# Patient Record
Sex: Male | Born: 1960
Health system: Southern US, Community
[De-identification: ages and names within clinical notes are randomized; demographics above are authoritative.]

## PROBLEM LIST (undated history)

## (undated) DIAGNOSIS — K5790 Diverticulosis of intestine, part unspecified, without perforation or abscess without bleeding: Secondary | ICD-10-CM

## (undated) DIAGNOSIS — E669 Obesity, unspecified: Secondary | ICD-10-CM

## (undated) DIAGNOSIS — K219 Gastro-esophageal reflux disease without esophagitis: Secondary | ICD-10-CM

## (undated) DIAGNOSIS — I480 Paroxysmal atrial fibrillation: Secondary | ICD-10-CM

## (undated) DIAGNOSIS — M1712 Unilateral primary osteoarthritis, left knee: Secondary | ICD-10-CM

## (undated) DIAGNOSIS — F329 Major depressive disorder, single episode, unspecified: Secondary | ICD-10-CM

## (undated) DIAGNOSIS — F32A Depression, unspecified: Secondary | ICD-10-CM

## (undated) DIAGNOSIS — I4892 Unspecified atrial flutter: Secondary | ICD-10-CM

## (undated) DIAGNOSIS — M1990 Unspecified osteoarthritis, unspecified site: Secondary | ICD-10-CM

## (undated) HISTORY — DX: Unspecified atrial flutter: I48.92

## (undated) HISTORY — PX: COLON SURGERY: SHX602

## (undated) HISTORY — DX: Paroxysmal atrial fibrillation: I48.0

## (undated) HISTORY — PX: EYE SURGERY: SHX253

## (undated) HISTORY — DX: Unspecified osteoarthritis, unspecified site: M19.90

## (undated) HISTORY — PX: COLONOSCOPY: SHX174

## (undated) HISTORY — DX: Obesity, unspecified: E66.9

## (undated) HISTORY — DX: Depression, unspecified: F32.A

## (undated) HISTORY — PX: WISDOM TOOTH EXTRACTION: SHX21

## (undated) HISTORY — PX: MULTIPLE TOOTH EXTRACTIONS: SHX2053

## (undated) HISTORY — DX: Major depressive disorder, single episode, unspecified: F32.9

## (undated) HISTORY — PX: REPLANTATION THUMB: SUR1233

## (undated) HISTORY — PX: TONSILLECTOMY: SUR1361

## (undated) HISTORY — PX: COLOSTOMY REVERSAL: SHX5782

## (undated) HISTORY — DX: Gastro-esophageal reflux disease without esophagitis: K21.9

---

## 1998-07-28 ENCOUNTER — Ambulatory Visit (HOSPITAL_COMMUNITY): Admission: RE | Admit: 1998-07-28 | Discharge: 1998-07-28 | Payer: Self-pay | Admitting: Internal Medicine

## 1998-08-10 ENCOUNTER — Encounter: Payer: Self-pay | Admitting: Internal Medicine

## 1998-08-10 ENCOUNTER — Ambulatory Visit (HOSPITAL_COMMUNITY): Admission: RE | Admit: 1998-08-10 | Discharge: 1998-08-10 | Payer: Self-pay | Admitting: Internal Medicine

## 1999-03-12 ENCOUNTER — Emergency Department (HOSPITAL_COMMUNITY): Admission: EM | Admit: 1999-03-12 | Discharge: 1999-03-12 | Payer: Self-pay | Admitting: Emergency Medicine

## 1999-03-15 ENCOUNTER — Encounter: Payer: Self-pay | Admitting: Internal Medicine

## 1999-03-15 ENCOUNTER — Ambulatory Visit (HOSPITAL_COMMUNITY): Admission: RE | Admit: 1999-03-15 | Discharge: 1999-03-15 | Payer: Self-pay | Admitting: Internal Medicine

## 1999-11-25 ENCOUNTER — Emergency Department (HOSPITAL_COMMUNITY): Admission: EM | Admit: 1999-11-25 | Discharge: 1999-11-25 | Payer: Self-pay | Admitting: Emergency Medicine

## 1999-11-25 ENCOUNTER — Encounter: Payer: Self-pay | Admitting: Emergency Medicine

## 2001-03-13 ENCOUNTER — Emergency Department (HOSPITAL_COMMUNITY): Admission: EM | Admit: 2001-03-13 | Discharge: 2001-03-13 | Payer: Self-pay | Admitting: Emergency Medicine

## 2001-03-13 ENCOUNTER — Encounter: Payer: Self-pay | Admitting: Emergency Medicine

## 2002-03-26 ENCOUNTER — Ambulatory Visit (HOSPITAL_COMMUNITY): Admission: RE | Admit: 2002-03-26 | Discharge: 2002-03-26 | Payer: Self-pay | Admitting: Family Medicine

## 2002-03-26 ENCOUNTER — Encounter: Payer: Self-pay | Admitting: Family Medicine

## 2004-06-09 ENCOUNTER — Emergency Department (HOSPITAL_COMMUNITY): Admission: EM | Admit: 2004-06-09 | Discharge: 2004-06-09 | Payer: Self-pay | Admitting: Emergency Medicine

## 2004-06-10 ENCOUNTER — Emergency Department (HOSPITAL_COMMUNITY): Admission: EM | Admit: 2004-06-10 | Discharge: 2004-06-10 | Payer: Self-pay | Admitting: Emergency Medicine

## 2004-06-16 ENCOUNTER — Emergency Department (HOSPITAL_COMMUNITY): Admission: EM | Admit: 2004-06-16 | Discharge: 2004-06-17 | Payer: Self-pay | Admitting: Emergency Medicine

## 2005-12-26 HISTORY — PX: COLOSTOMY: SHX63

## 2006-01-09 ENCOUNTER — Emergency Department (HOSPITAL_COMMUNITY): Admission: EM | Admit: 2006-01-09 | Discharge: 2006-01-09 | Payer: Self-pay | Admitting: Emergency Medicine

## 2006-10-28 ENCOUNTER — Inpatient Hospital Stay (HOSPITAL_COMMUNITY): Admission: EM | Admit: 2006-10-28 | Discharge: 2006-11-01 | Payer: Self-pay | Admitting: Emergency Medicine

## 2006-11-03 ENCOUNTER — Encounter: Admission: RE | Admit: 2006-11-03 | Discharge: 2006-11-03 | Payer: Self-pay | Admitting: General Surgery

## 2006-11-07 ENCOUNTER — Ambulatory Visit (HOSPITAL_COMMUNITY): Admission: RE | Admit: 2006-11-07 | Discharge: 2006-11-07 | Payer: Self-pay | Admitting: General Surgery

## 2006-11-20 ENCOUNTER — Ambulatory Visit (HOSPITAL_COMMUNITY): Admission: RE | Admit: 2006-11-20 | Discharge: 2006-11-20 | Payer: Self-pay | Admitting: General Surgery

## 2006-12-05 ENCOUNTER — Ambulatory Visit (HOSPITAL_COMMUNITY): Admission: RE | Admit: 2006-12-05 | Discharge: 2006-12-05 | Payer: Self-pay | Admitting: General Surgery

## 2006-12-14 ENCOUNTER — Ambulatory Visit (HOSPITAL_COMMUNITY): Admission: RE | Admit: 2006-12-14 | Discharge: 2006-12-14 | Payer: Self-pay | Admitting: General Surgery

## 2006-12-18 ENCOUNTER — Inpatient Hospital Stay (HOSPITAL_COMMUNITY): Admission: EM | Admit: 2006-12-18 | Discharge: 2006-12-25 | Payer: Self-pay | Admitting: Emergency Medicine

## 2006-12-18 ENCOUNTER — Encounter (INDEPENDENT_AMBULATORY_CARE_PROVIDER_SITE_OTHER): Payer: Self-pay | Admitting: Specialist

## 2006-12-26 HISTORY — PX: INCISIONAL HERNIA REPAIR: SHX193

## 2007-03-16 ENCOUNTER — Encounter: Admission: RE | Admit: 2007-03-16 | Discharge: 2007-03-16 | Payer: Self-pay | Admitting: General Surgery

## 2007-05-04 ENCOUNTER — Inpatient Hospital Stay (HOSPITAL_COMMUNITY): Admission: RE | Admit: 2007-05-04 | Discharge: 2007-05-10 | Payer: Self-pay | Admitting: General Surgery

## 2007-05-04 ENCOUNTER — Encounter (INDEPENDENT_AMBULATORY_CARE_PROVIDER_SITE_OTHER): Payer: Self-pay | Admitting: Specialist

## 2007-08-21 ENCOUNTER — Emergency Department (HOSPITAL_COMMUNITY): Admission: EM | Admit: 2007-08-21 | Discharge: 2007-08-21 | Payer: Self-pay | Admitting: Emergency Medicine

## 2007-10-12 ENCOUNTER — Inpatient Hospital Stay (HOSPITAL_COMMUNITY): Admission: EM | Admit: 2007-10-12 | Discharge: 2007-10-16 | Payer: Self-pay | Admitting: Emergency Medicine

## 2007-10-18 ENCOUNTER — Inpatient Hospital Stay (HOSPITAL_COMMUNITY): Admission: EM | Admit: 2007-10-18 | Discharge: 2007-11-02 | Payer: Self-pay | Admitting: Emergency Medicine

## 2007-10-22 ENCOUNTER — Encounter (INDEPENDENT_AMBULATORY_CARE_PROVIDER_SITE_OTHER): Payer: Self-pay | Admitting: General Surgery

## 2008-11-25 ENCOUNTER — Ambulatory Visit (HOSPITAL_BASED_OUTPATIENT_CLINIC_OR_DEPARTMENT_OTHER): Admission: RE | Admit: 2008-11-25 | Discharge: 2008-11-25 | Payer: Self-pay | Admitting: Orthopedic Surgery

## 2009-04-25 HISTORY — PX: KNEE ARTHROSCOPY: SHX127

## 2010-04-20 ENCOUNTER — Ambulatory Visit (HOSPITAL_COMMUNITY): Admission: RE | Admit: 2010-04-20 | Discharge: 2010-04-21 | Payer: Self-pay | Admitting: Neurosurgery

## 2010-04-25 HISTORY — PX: OTHER SURGICAL HISTORY: SHX169

## 2010-12-17 ENCOUNTER — Encounter
Admission: RE | Admit: 2010-12-17 | Discharge: 2010-12-17 | Payer: Self-pay | Source: Home / Self Care | Attending: Orthopedic Surgery | Admitting: Orthopedic Surgery

## 2011-03-15 LAB — BASIC METABOLIC PANEL
BUN: 15 mg/dL (ref 6–23)
CO2: 27 mEq/L (ref 19–32)
Calcium: 9.6 mg/dL (ref 8.4–10.5)
Chloride: 107 mEq/L (ref 96–112)
Creatinine, Ser: 0.85 mg/dL (ref 0.4–1.5)
GFR calc Af Amer: >60 mL/min (ref >60)
GFR calc non Af Amer: >60 mL/min (ref >60)
Glucose, Bld: 100 mg/dL — ABNORMAL HIGH (ref 70–99)
Potassium: 4.4 mEq/L (ref 3.5–5.1)
Sodium: 138 mEq/L (ref 135–145)

## 2011-03-15 LAB — CBC
HCT: 42.6 % (ref 39.0–52.0)
Hemoglobin: 14.8 g/dL (ref 13.0–17.0)
MCHC: 34.6 g/dL (ref 30.0–36.0)
MCV: 91.2 fL (ref 78.0–100.0)
Platelets: 208 10*3/uL (ref 150–400)
RBC: 4.67 MIL/uL (ref 4.22–5.81)
RDW: 13.5 % (ref 11.5–15.5)
WBC: 11.6 10*3/uL — ABNORMAL HIGH (ref 4.0–10.5)

## 2011-03-15 LAB — SURGICAL PCR SCREEN
MRSA, PCR: NEGATIVE
Staphylococcus aureus: POSITIVE — AB

## 2011-05-10 NOTE — H&P (Signed)
[name], [name]              ACCOUNT NO.:  [id number]   MEDICAL RECORD NO.:  [id number]          PATIENT TYPE:  INP   LOCATION:  1531                         FACILITY:  [facility]   PHYSICIAN:  Alfonse Ras, MD   DATE OF BIRTH:  10-11-1961   DATE OF ADMISSION:  10/18/2007  DATE OF DISCHARGE:                              HISTORY & PHYSICAL   INTERIM HISTORY AND PHYSICAL   ADMISSION DIAGNOSIS:  Recurrent high-grade small bowel obstruction.   HISTORY OF PRESENT ILLNESS:  The patient is a 50 year old white male  with a complex past surgical history who returns after being discharged  about 36 hours ago with onset of abdominal pain, nausea, and diarrhea.  He has not been vomiting but does feel like he needs to.  He was worked  up here in the emergency room and found to have a high-grade small bowel  obstruction on KUB.  He complains of diffuse abdominal pain, but no  evidence of incarceration of either of his hernias.   PAST MEDICAL HISTORY:  Significant for PVCs; perforated sigmoid  diverticulitis in the past, known well to Dr. Abbey Chatters with colectomy,  colostomy closure, and with ventral hernias.  The patient is scheduled 8  days from today for ventral hernia repair and lysis of adhesions.   MEDICATIONS:  Included atenolol and Ambien.   SOCIAL HISTORY:  He is accompanied by his wife.  He is a former smoker  and uses no alcohol.   PHYSICAL EXAMINATION:  GENERAL:  He is an age-appropriate white male in  no distress.  VITAL SIGNS:  His temperature is 99.4, his heart rate is 86, respiratory  rate is 16, and blood pressure is 164/98.  HEENT:  Benign.  Normocephalic and atraumatic.  NECK:  Supple and soft without thyromegaly or cervical adenopathy.  LUNGS:  Clear to auscultation and percussion x2.  HEART:  Regular rate and rhythm without murmurs, rubs, or gallops.  ABDOMEN:  Soft.  There is a reducible midline periumbilical hernia and a  right-sided upper abdominal reducible  hernia.   LABORATORY DATA:  Labs today show a white count of 10,000.  Hemoglobin  is 15.8.  Electrolytes are all within normal limits.  Creatinine is 0.9.   IMPRESSION:  Small bowel obstruction.   PLAN:  NG tube, IV fluid hydration, and followup with Dr. Abbey Chatters  tomorrow and possible expedience of his operative intervention.      Alfonse Ras, MD  Electronically Signed     KRE/MEDQ  D:  10/18/2007  T:  10/19/2007  Job:  [id number]   cc:   Adolph Pollack, M.D.  [address], [address]  [place]  [place] [address]

## 2011-05-10 NOTE — Op Note (Signed)
[name], [name]              ACCOUNT NO.:  [id number]   MEDICAL RECORD NO.:  [id number]          PATIENT TYPE:  AMB   LOCATION:  DSC                          FACILITY:  [facility]   PHYSICIAN:  Katy Fitch. Sypher, M.D. DATE OF BIRTH:  08/04/1961   DATE OF PROCEDURE:  11/25/2008  DATE OF DISCHARGE:                               OPERATIVE REPORT   PREOPERATIVE DIAGNOSIS:  A 21 days status post sheet metal laceration  dorsal aspect of right thumb at interphalangeal joint with clinical  extensor lag of interphalangeal joint suggesting extensor pollicis  longus laceration with a 2-week history of wound infection, treated with  oral doxycycline with subsequent wound improvement.   POSTOPERATIVE DIAGNOSIS:  Status post laceration of extensor pollicis  longus at interphalangeal joint, right thumb with extensor lag and  chronic granulation tissue within interphalangeal joint and at extensor  tendon laceration site.   OPERATION:  1. Excisional debridement of chronically infected right thumb      interphalangeal joint and dorsal extensor tendon laceration wound.  2. Interphalangeal joint irrigation.  3. Internal fixation of right thumb interphalangeal joint at 15      degrees hyperextension utilizing a 0.045-inch Kirschner wire placed      with retrograde technique.  4. Reconstruction of right thumb extensor pollicis longus, 21 days      post-laceration with grasping suture of 3-0 monofilament Prolene      and finishing suture of monofilament Prolene.   OPERATIONS:  Katy Fitch. Sypher, MD   ASSISTANT:  Marveen Reeks Dasnoit, PA-C   ANESTHESIA:  General by LMA.   SUPERVISING ANESTHESIOLOGIST:  Guadalupe Maple, MD   INDICATIONS:  [name] is a 50 year old gentleman referred by  Knox Royalty, MD for evaluation and management of a right thumb dorsal  laceration with extensor lag at the IP joint and cellulitis of the  thumb.  On November 04, 2008, [name] sustained an on-the-job injury  and was treated at an Outpatient Occup[facility] Facility.  His  wound was cleaned and repaired.  He went on to develop what appeared to  be cellulitis and was started on an oral antibiotic.  He developed an  extensor lag of the thumb and was referred for upstanding orthopedic  consult.  He was seen on November 19, 2008 where his chronically  infected wound appeared to be improving and his extensor lag was noted.   We formulated a plan to continue oral antibiotic therapy for an  additional 5 days with local wound care, followed by anticipated delayed  reconstruction of his extensor tendon and internal fixation of the IP  joint and hyperextension to protect the tendon repair.   Preoperatively, [name] was very precisely informed of the risks of  this predicament, i.e., he has a chronically contaminated wound with a  history of past cellulitis, a likely injury into his IP joint, and a  recognized extensor tendon injury.   He will require internal fixation to protect his tendon repair and could  be at risk for late sepsis given a contaminated wound.   He discussed whether or not he  could return immediately to work after  surgery.   In the office and in the holding area, I recommended that he go back  only to strict one-handed duty and not try to work with the injured  right hand.   Should he soil his pin or his wound, he could develop recurrent wound  sepsis, possibly disrupt the extensor tendon repair, possibly develop  septic arthritis or osteomyelitis of his thumb bones and could end up  with permanent disability.  Therefore, I urged him to follow our  strategy of oral antibiotic therapy and local wound care to stabilize  the wound followed by wound excision and delayed reconstruction of the  tendon with monofilament suture and wound protection by internal  fixation of the IP joint.  Questions were invited and answered in detail  in the holding area with [name] and his  wife prior to surgery.  We  did obtain a written precertification from his insurance carrier prior  to surgery.   PROCEDURE:  [name] is brought to the operating room and placed  in supine position upon on the operating table.   Following an anesthesia consult, general anesthesia by LMA technique was  recommended and accepted.   He was brought to room 6, placed in supine position upon on the  operating table and under Dr. Morley Kos direct supervision, general  anesthesia by LMA technique induced.  The right arm was prepped with  Betadine soap solution and sterilely draped.  Vancomycin 1 g was  administered as an IV prophylactic antibiotic due to antibiotic  allergies and a concern about possible wound contamination  preoperatively.   The right arm was exsanguinated with an Esmarch bandage and an arterial  tourniquet on the proximal brachium inflated to 225 mmHg.   Procedure commenced with a formal excisional debridement of the wound  with removal of the healing skin margins, subcutaneous tissues and  freshening of the extensor tendons, proximal and distal stumps.  The  wound did continue directly into the IP joint with capsular entry.  There was some granulation tissue within the IP joint as well as  metallic foreign debris.  The IP joint was meticulously irrigated  followed by debridement with sterile forceps and further irrigation.  The IP joint was then secured with a 0.045-inch Kirschner wire, drilled  retrograde into the proximal phalanx and distally with C-arm control.  The extensor tendon was then mobilized by tenolysis proximally and  repaired with two core grasping sutures of 3-0 Prolene with the knots  buried deep and a finishing suture of 3-0 Prolene.  Monofilament was  selected to minimize the risk of postrepair suture sepsis.   The wound was then repaired with intradermal 4-0 Prolene.  Lidocaine 2%  was infiltrated for postop analgesia.  The wound was dressed  with  Xeroflo, sterile gauze, sterile Webril, and a forearm-based thumb spica  splint.  There were no apparent complications.   For aftercare, [name] was provided Dilaudid 2 mg one to two tablets  p.o. q.4-6 h. p.r.n. pain, 30 tablets without refill and doxycycline 100  mg one p.o. b.i.d. x10 days as a therapeutic antibiotic.      Katy Fitch Sypher, M.D.  Electronically Signed     RVS/MEDQ  D:  11/25/2008  T:  11/26/2008  Job:  [id number]   cc:   Paulino Rily, M.D.

## 2011-05-10 NOTE — Op Note (Signed)
[name], [name] NO.:  [id number]   MEDICAL RECORD NO.:  [id number]          PATIENT TYPE:  INP   LOCATION:  1531                         FACILITY:  [facility]   PHYSICIAN:  Adolph Pollack, M.D.DATE OF BIRTH:  Jun 14, 1961   DATE OF PROCEDURE:  10/22/2007  DATE OF DISCHARGE:                               OPERATIVE REPORT   PREOPERATIVE DIAGNOSIS:  Small-bowel obstruction and ventral incisional  hernias.   PREOPERATIVE DIAGNOSIS:  Small-bowel obstruction and ventral incisional  hernias.   PROCEDURE:  Exploratory laparotomy, lysis of adhesions, repair of  ventral hernias with Proceed mesh.   SURGEON:  Adolph Pollack, M.D.   ASSISTANT:  Consuello Bossier, M.D.   ANESTHESIA:  General.   INDICATIONS:  [name] is a 50 year old male who has elective  laparoscopic ventral hernia repairs scheduled for Friday October 31.  However, in the past two weeks has had two episodes of small bowel  obstruction that quickly resolved with decompression and bowel rest.  He  recently was admitted for this problem on October 18, 2007.  The hernias  are reducible so it is not due to that.  Thus I had moved the surgery up  to today.  We discussed exploratory laparotomy with looking for a source  of small bowel obstruction and fixing his ventral hernias.   TECHNIQUE:  He is brought to the operating room, placed supine on the  operating table and general anesthetic was administered.  The hair in  the abdominal wall was clipped.  The abdominal wall was sterilely  prepped and draped.  I began above the previous midline incision,  incising the skin and some of the incision down to the periumbilical  region.  The subcutaneous tissue was divided with cautery.  I quickly  identified hernia contents coming up into the wound in the epigastric  region.  Normal fascia was incised and peritoneal cavity was entered.  I  then dissected the omentum free from the abdominal wall and opened  up  the hernia sacs and then fascia the rest the way down to the below the  umbilicus.  There was a hernia in the periumbilical region and one just  above that as well.  Where he had a previous colostomy felt like there  was a defect.  I then used the electrocautery to dissect omentum free  from the abdominal wall and free it up completely.  There was indeed  hernia where the previous colostomy was.  I noted some dilated bowel and  traced this down to an area in the right lower quadrant where there was  adhesion leading to the bowel obstruction.  I lysed this adhesions  freeing the obstructed area.  I then ran the entire small bowel lysing  other adhesions as well but this is the only part of obstruction.  No  small bowel injuries were made.   Following this I directed my attention to repairing hernia.  On the  right side, I raised subcutaneous flaps off the fascia, then did this  superiorly and inferiorly in a limited fashion.   On the left side  I raised flaps both above and below the area where the  colostomy and did not try to raise subcutaneous flaps around the  colostomy site.  Following this I then brought in a large piece of  Proceed surgical mesh measuring about 28 x 34 cm.  I initially placed it  with the nonadherent barrier down on the left side well lateral to the  stoma the previous colostomy site incisional hernia defect and I tacked  this using the spiral tacker to cover this defect.  I then used a  transfascial fixation sutures of #1 Novofil to further anchor the mesh  to the fascia in all directions.  I performed more tacking inferiorly as  well.  Once I had completed the mesh fixation in 360 degrees the mesh  had a little bit of laxity but not much.  Some overlapping fascia was  then anchored to the mesh with interrupted 2-0 Vicryl sutures.  Of note  should be that I was able to place the omentum between the small bowel  and the mesh.   Following this hemostasis was  adequate.  A stab incision was made in the  right lower quadrant and a 19 Blake drain was placed so that it looped  around to include both the right and left sides.  This was anchored to  the skin with 3-0 nylon suture.  At this point sponge, needle and  instrument counts were correct.  I then reapproximated the subcutaneous  tissue over the mesh with running 2-0 Vicryl suture.  Subcutaneous  tissues irrigated and skin was closed with staples.  Sterile dressings  applied.   He tolerated the procedure well without any apparent complications was  taken to recovery in satisfactory condition.      Adolph Pollack, M.D.  Electronically Signed     TJR/MEDQ  D:  10/22/2007  T:  10/23/2007  Job:  [id number]

## 2011-05-10 NOTE — H&P (Signed)
[name], [name]              ACCOUNT NO.:  [id number]   MEDICAL RECORD NO.:  [id number]          PATIENT TYPE:  INP   LOCATION:  1535                         [facility]ACILITY:  [facility]   PHYSICIAN:  Adolph Pollack, M.D.DATE OF BIRTH:  1961-03-16   DATE OF ADMISSION:  10/12/2007  DATE OF DISCHARGE:                              HISTORY & PHYSICAL   CHIEF COMPLAINT:  Abdominal pain, distention, nausea, vomiting.   PRESENT ILLNESS:  [name] is a 50 year old male with a complex  surgical history.  Last night, after eating pork, he got crampy pain in  the right abdomen and progressively has had distention, and this was  eventually followed by dry heaves and small-volume nausea and vomiting.  Last BM was yesterday at noon.  He has passed some gas today.  He called  the office, and I had him present to the emergency department for  evaluation.  He has had some sweats that occur with the cramping pain,  but no fever or chills.   PAST MEDICAL HISTORY:  1. Premature ventricular contractions.  2. Perforated sigmoid diverticulitis.  3. Ventral hernia.   PREVIOUS OPERATIONS:  1. Left knee arthroscopy.  2. Sigmoid colectomy and colostomy.  3. Colostomy closure.   ALLERGIES:  1. PENICILLINS.  2. PERCOCET.  3. ERYTHROMYCIN.   MEDICATIONS:  Atenolol and Ambien.   SOCIAL HISTORY:  Her is married.  Here with his wife.  Former smoker.  No alcohol use.   REVIEW OF SYSTEMS:  GENERAL:  He was in a normal state of health until  yesterday.  CARDIOVASCULAR:  No myocardial infarction or hypertension.  PULMONARY:  No asthma or COPD.  GI:  No known peptic ulcer disease.  GU:  No dysuria or hematuria.  ENDOCRINE:  No diabetes or  hypercholesterolemia.   PHYSICAL EXAM:  GENERAL:  An ill-appearing male, but he is in no acute  distress.  He is pleasant and cooperative.  VITAL SIGNS:  Temperature is 97.7, blood pressure is 128/84, pulse of  93.  HEENT: Extraocular motions intact, no icterus.   Mucous membranes are  dry.  NECK:  Supple without masses or thyroid enlargement.  CHEST:  Breath sounds equal and clear.  Respirations unlabored.  CARDIOVASCULAR:  Demonstrates a regular, regular rhythm.  I hear no  murmur.  ABDOMEN:  Soft.  There is a reducible midline ventral incisional hernia  present.  There is mild right-sided tenderness.  There is a left-sided  incision that is solid.  He has hypoactive bowel sounds.  EXTREMITIES:  No cyanosis or edema.  SKIN:  No jaundice.   LABORATORY DATA:  Notable for white cell count of 16,200, hemoglobin  16.8.  Electrolytes within normal limits, except for glucose of 123.  Albumin 4.1.   X-rays demonstrate some dilated small-bowel loops with air-fluid levels  and some air in the transverse and right colon.   IMPRESSION:  Likely partial small-bowel obstruction versus enteritis.   PLAN:  Will admit, IV fluid hydration, NG tube decompression, follow-up  x-rays and labs in morning.  Will try to treat this nonoperatively.  He  does have elective  surgery to repair his ventral hernia in about 2-3  weeks.  If it does not respond to nonoperative management, then we will  need to go ahead and do exploratory laparotomy.      Adolph Pollack, M.D.  Electronically Signed     TJR/MEDQ  D:  10/12/2007  T:  10/15/2007  Job:  [id number]   cc:   Dario Guardian, M.D.  Fax: [phone]

## 2011-05-13 NOTE — Discharge Summary (Signed)
[name], [name]              ACCOUNT NO.:  [id number]   MEDICAL RECORD NO.:  [id number]          PATIENT TYPE:  INP   LOCATION:  1531                         FACILITY:  [facility]   PHYSICIAN:  Adolph Pollack, M.D.DATE OF BIRTH:  05/04/1961   DATE OF ADMISSION:  10/18/2007  DATE OF DISCHARGE:  11/02/2007                               DISCHARGE SUMMARY   FINAL DISCHARGE DIAGNOSIS:  Small-bowel obstruction.   SECONDARY DIAGNOSES:  1. Ventral hernia repairs.  2. Postoperative ileus.  3. Anxiety.  4. Protein calorie malnutrition.  5. Pneumonia.   PROCEDURE:  Exploratory laparotomy with lysis of adhesions and repair of  ventral incisional hernias with mesh.   REASON FOR ADMISSION:  [name] is a 50 year old male with ventral  hernias and actually he had been admitted and discharged two days prior  to this admission with a partial small-bowel obstruction and came back  with the same symptoms of small bowel obstruction and was admitted.   HOSPITAL COURSE:  He was again started on IV fluids and NG tube, and his  bowel obstruction rapidly resolved.  A PICC line was started.  He was  placed on TNA.  He subsequently was taken to the operating room on  October 22, 2007 where he underwent the above procedure.  Postoperatively he had an NG tube in and had his TNA continued and the  postoperative ileus.  He was ambulatory fairly soon.  He had a drain in  which drained serous material.  By the sixth postoperative day he  started passing some gas, and his NG tube was clamped.  He began having  a BM but also had some vomiting.  He became fairly anxious was given  some Ativan and this controlled it well.  He is having multiple loose  stools and a stool for C. diff was sent and was negative.  He had a  leukocytosis of 15,800 and an x-ray demonstrating some left basilar  atelectasis.  He had also been coughing up some green sputum.  So the  thought was he may have an intra-abdominal  infection versus pneumonia.   A CT scan demonstrated no intraperitoneal fluid, small bowel ileus type  pattern, right and left abdominal wall fluid collections with slight rim  enhancement of the left side collection.  I had started him on IV Invanz  as well for possible pneumonia.  His NG was removed, and a full liquid  diet was started.  Interventional radiology was consulted and left  abdominal fluid collection was aspirated.  He had about 21 mL of brown-  colored fluid.  His leukocytosis improved.  His aspiration fluid culture  was no growth, and he was tolerating his diet well. Wound looked good.  Jackson-Pratt drain was removed, and he was able to be discharged on  November 02, 2007.   DISPOSITION:  Discharged to home November 02, 2007 in satisfactory  condition.   DISCHARGE INSTRUCTIONS:  He is given activity restrictions.  He is to  take Darvocet for pain and Avelox for presumptive postoperative  pneumonia.  He will follow up to see me in  about 1-2 weeks.      Adolph Pollack, M.D.  Electronically Signed     TJR/MEDQ  D:  11/09/2007  T:  11/09/2007  Job:  [id number]

## 2011-05-13 NOTE — H&P (Signed)
NAMEBRAYAM, BOEKE NO.:  [id number]   MEDICAL RECORD NO.:  [id number]         PATIENT TYPE:  LINP   LOCATION:                               FACILITY:  [facility]   PHYSICIAN:  Wilmon Arms. Corliss Skains, M.D. DATE OF BIRTH:  09-17-1961   DATE OF ADMISSION:  12/18/2006  DATE OF DISCHARGE:                              HISTORY & PHYSICAL   CHIEF COMPLAINT:  Left lower quadrant abdominal wall pain, swelling, and  redness.   HISTORY OF PRESENT ILLNESS:  The patient is a 50 year old male who was  initially admitted by Dr. Abbey Chatters 10/28/2006 with the diagnosis  sigmoid diverticulitis with micro perforation.  He was initially treated  with intravenous antibiotics.  A CT scan was repeated and showed that  the fluid collection, in his pelvis, had a large.  He then had a  percutaneous drain placed by radiology which improved his clinical  status.  He kept the drain at home as well as with oral antibiotics for  several weeks.  He had a fistulogram on 12/05/2006 which showed  decreased contrast filling of the sigmoid colon.  He did have spillage  of contrast into the peritoneum which resulted in several days of  contrast peritonitis.  His drain came out at home on 12/10/2006.   On 12/20 the patient began complaining of swelling and redness around  his previous pelvic drain site.  He was evaluated in the urgent office  at [facility] by Dr. Earlene Plater who sent him to [facility] for a CT scan of the abdomen and pelvis.  This scan was  performed later that day; and showed a 2.3 cm gas collection in the  region of the previous abscess.  There is also inflammatory change in  the left abdominal musculature around the previous drainage tract.  He  was evaluated by Dr. Ovidio Kin who was on call, at [facility] that evening; and was felt to be ready for discharge.  He was  sent home with ciprofloxacin and Flagyl.  He was also given p.r.n. pain  medication.   He has been feeling worse over the last couple of days so  he came back to the emergency department.  He is noted to be febrile;  and had an increased white count.   PAST MEDICAL HISTORY:  Premature ventricular contractions.   PAST SURGICAL HISTORY:  1. Left knee arthroscopy.  2. Tonsillectomy.   ALLERGIES:  ERYTHROMYCIN, PENICILLIN.   MEDICATIONS:  Atenolol, aspirin, Cipro, and Flagyl.   SOCIAL HISTORY:  The patient recently quit smoking.  Denies alcohol use.   FAMILY HISTORY:  Positive for diabetes, PVCs in a sister, diverticulitis  in his mother.   REVIEW OF SYSTEMS:  CV:  Denies heart disease or hypertension.  PULMONARY:  Denies dyspnea, pneumonia, emphysema, or asthma.  GI: Denies  peptic ulcer disease, hiatal hernia reflux, melena, hepatitis, or  jaundice.  ENDOCRINE:  No diabetes.  HEMATOLOGIC: No bleeding disorders  or blood clots.  NEUROLOGIC: No strokes or procedures.   PHYSICAL EXAMINATION:  GENERAL:  This is a well-developed, well-  nourished male in no apparent distress.  VITAL SIGNS: Temperature 101.3, heart rate 112, respirations 20, blood  pressure 125/75.  HEENT:  EOMI.  Sclerae anicteric.  NECK: No masses, no thyromegaly.  LUNGS:  Clear to auscultation bilaterally.  Normal respiratory effort.  HEART:  Regular rate and rhythm.  No murmur.  ABDOMEN:  Positive bowel sounds, soft, left lower quadrant out to the  left flank is erythematous.  I can see the previous drainage tract.  This is the area of the most tenderness.  There is no obvious fluctuance  underneath this; but the patient is fairly tender this area.  This  extends partly around to his back.   LABS:  White count 20.7, hemoglobin 13.5, sodium 132, potassium 3.7.   IMPRESSION:  1. Probable recurrent diverticular abscess, now with possible      involvement of the abdominal wall.  2. Hyponatremia.  3. Hypokalemia.   PLAN:  1. We will admit the patient for IV hydration.  2. IV antibiotics.  We  will use Tygacil since the patient is      PENICILLIN allergic; and has had a serious intra-abdominal      infection.  3. CT scan of the abdomen and pelvis with contrast; before he leaves      the emergency department this morning.  4. Replete electrolytes.  5. We will discuss this case further with Dr. Abbey Chatters.  The patient      may need further percutaneous drainage versus a possible sigmoid      colectomy with a possible temporary colostomy.      Wilmon Arms. Tsuei, M.D.  Electronically Signed     MKT/MEDQ  D:  12/18/2006  T:  12/18/2006  Job:  [id number]

## 2011-05-13 NOTE — Op Note (Signed)
[name], GROENEVELD NO.:  [id number]   MEDICAL RECORD NO.:  [id number]          PATIENT TYPE:  INP   LOCATION:  1612                         FACILITY:  [facility]   PHYSICIAN:  Adolph Pollack, M.D.DATE OF BIRTH:  [birth year]   DATE OF PROCEDURE:  05/04/2007  DATE OF DISCHARGE:                               OPERATIVE REPORT   PREOPERATIVE DIAGNOSIS:  Colostomy and wound sinus.   POSTOPERATIVE DIAGNOSIS:  Colostomy and wound sinus.   PROCEDURE:  1. Laparoscopic assisted colostomy closure.  2. Inferior wound debridement.   SURGEON:  Adolph Pollack, M.D.   ASSISTANT:  Alfonse Ras, M.D.   ANESTHESIA:  General.   INDICATIONS:  50 year old male underwent a sigmoid colectomy and  colostomy for complicated diverticulitis.  This was done in  December2007.  He has had intermittently draining sinuses from the  inferior aspect of the wound, as well.  He now presents for the above  procedure.   TECHNIQUE:  He is brought to the operating room, placed on operating  table, and general anesthetic was administered.  A Foley catheter was  placed in the bladder and he was placed in the lithotomy position.  The  stomal appliance was removed.  The abdominal wall was sterilely prepped  and draped as well as the perineal area.  I covered the stoma with  Betadine soaked gauze and a dry towel.  I made an incision in the right  mid abdomen through skin, subcutaneous tissue and fascial layers and  entering the peritoneal cavity.  A purse-string suture of 0 Vicryl was  placed around the fascial edges.  A Hassan trocar was introduced into  the peritoneal cavity and pneumoperitoneum created by insufflation of  CO2 gas.   Next, the laparoscope introduced.  There were multiple adhesions between  the omentum and the anterior abdominal wall.  I placed a 10 mm trocar in  the right lower quadrant and one in the right upper quadrant.  Using  careful sharp and blunt dissection as  well as the harmonic scalpel, I  divided the adhesions from the omentum to the anterior abdominal wall  the length of his incision as well as in the left upper quadrant and  left mid abdomen.  I was able to expose the colostomy area and noted  some small bowel adhesions which I carefully took down sharply.  I then  was able to visualize the pelvis area and saw a blue Prolene suture.  This identified the rectosigmoid stump which I had left behind.  There  appeared to be some loops of small bowel densely adherent to the staple  line.   I subsequently approached the colostomy and made an elliptical incision  through the skin and full thickness.  Using the electrocautery, I  carefully dissected from the stoma free from its fascial attachments and  I was able to bring out a fair amount of the descending colon and  mobilize it.  I then used the GIA stapler to amputate off the stoma and  send the segmental colectomy specimen to pathology.   Following this, I reopened  the lower aspect of his midline wound where  he had the chronic draining sinus tracts excising full thickness skin  and subcutaneous tissue in the sinus tract.  I then divided the fascia  with electrocautery, entering the abdominal cavity.  I identified the  rectosigmoid stump by the Prolene suture.  I performed sharp  adhesiolysis freeing up the small bowel from the rectosigmoid stump  giving me some length with that.  I was easily able to bring the  descending colon down to the rectosigmoid stump without tension.  I then  removed both staple lines with electrocautery.  I performed an end-to-  end anastomosis with an interrupted single layer of 3-0 silk sutures.  Following this, I placed the anastomosis under water and insufflated air  through the rectum and no leak was noted.   I then copiously irrigated out the abdominal cavity and evacuated the  fluid.  No active bleeding was noted.  The anastomosis was patent and  viable  under no tension.  Tisseel was placed around the anastomosis.  Following this, sponge, needle, and instrument counts were reported to  be correct.  I then closed the lower midline wound fascia with a running  #1 Novofil suture and placed one #2 Ethibond retention suture right in  the middle of this wound.  I did not close the skin.  I then closed the  colostomy site fascia with a running #1 Novofil suture.  Both of these  wounds were then packed with moist gauze.  I reinsufflated the abdomen  and inspected both fascial closures and they were solid.  I removed the  abdominal trocars.  I then tightened up the [facility] trocar incision site  fascia by tying down the pursestring suture.  Staples were used to close  the skin of the trocar sites.   A moist gauze was packed in the lower abdominal wound and the colostomy  wound and a bulky dressing was applied.  He tolerated the procedure well  without any apparent complications and was taken to the recovery room in  satisfactory condition.      Adolph Pollack, M.D.  Electronically Signed     TJR/MEDQ  D:  05/04/2007  T:  05/04/2007  Job:  [id number]

## 2011-05-13 NOTE — Discharge Summary (Signed)
NAMEJOVANNY, [name]              ACCOUNT NO.:  [id number]   MEDICAL RECORD NO.:  [id number]          PATIENT TYPE:  INP   LOCATION:  1608                         FACILITY:  [facility]   PHYSICIAN:  Adolph Pollack, M.D.DATE OF BIRTH:  03/16/1961   DATE OF ADMISSION:  10/28/2006  DATE OF DISCHARGE:  11/01/2006                               DISCHARGE SUMMARY   PRINCIPAL DISCHARGE DIAGNOSIS:  Sigmoid diverticulitis with abdominal  abscess.   SECONDARY DIAGNOSIS:  History of premature ventricular contractions.   PROCEDURE:  Percutaneous drainage of abscess by interventional radiology  October 31, 2006.   REASON FOR ADMISSION:  This is a 50 year old male who 48 hours prior to  admission had significant cramping lower abdominal pain and diarrhea,  specifically after a meal.  The pain persisted and he started having  fever and he came to the emergency department.  He was evaluated there  and a CT scan demonstrated inflammatory changes of the sigmoid colon and  some extraluminal air and fluid suggesting a micro-perforation most  consistent with a diverticulitis versus possible foreign body.  He was  subsequently admitted.   HOSPITAL COURSE:  He was admitted and placed on intravenous antibiotics.  His fever and leukocytosis did respond to this.  A CT scan was repeated,  however, the fluid collection was larger.  Subsequently, interventional  radiology placed a drainage catheter and he felt quite a bit better  after that.  The day after that, he was feeling good.  He was switched  to oral antibiotics.  In discussion with him, he was wanting to be  discharged and was discharge was arranged on November 01, 2006.   DISPOSITION:  Discharged home November 01, 2006.  The nurses will teach  him how to flush the drain and record output.  He will be discharged on  Cipro and Flagyl.  Continue home medications.   PLAN:  Will be to have him come back in the office to see me on  November14, 2007 and  arrange for repeat CT prior to that.      Adolph Pollack, M.D.  Electronically Signed     TJR/MEDQ  D:  11/24/2006  T:  11/24/2006  Job:  [id number]   cc:   Stacie Acres. Cliffton Asters, M.D.  Fax: [phone]

## 2011-05-13 NOTE — Discharge Summary (Signed)
[name], [name]              ACCOUNT NO.:  [id number]   MEDICAL RECORD NO.:  [id number]          PATIENT TYPE:  INP   LOCATION:  1535                         FACILITY:  [facility]   PHYSICIAN:  Adolph Pollack, M.D.DATE OF BIRTH:  Mar 25, 1961   DATE OF ADMISSION:  10/12/2007  DATE OF DISCHARGE:  10/16/2007                               DISCHARGE SUMMARY   PRINCIPAL DISCHARGE DIAGNOSIS:  Partial small-bowel obstruction.   FINAL DIAGNOSIS:  Partial small-bowel obstruction.   SECONDARY DIAGNOSIS:  None.   PROCEDURES:  None.   REASON FOR ADMISSION:  Mr. Juncaj is a 50 year old male who had the  onset of right-sided abdominal pain after eating supper on October 11, 2007.  Pain comes in waves, and he had some nausea low-volume nonbilious  vomiting.  He was passing a little gas.  He was evaluated in the  emergency room and noted to have a soft reducible midline incisional  hernia (and planned to be repaired in the near future) mild right-sided  tenderness.  White cell count 16,200 but hemoglobin was also elevated.  X-rays demonstrates dilated small-bowel loops with air-fluid levels, and  it was felt he had a partial small-bowel obstruction and was admitted.   HOSPITAL COURSE:  He was admitted, and bowel decompression was performed  by way of the NG tube.  He began a slow improvement until began passing  gas and having BM's and his NG tube was removed.  His diet was rapidly  advanced.  By October 16, 2007 the partial small-bowel obstruction had  resolved.  His diet was advanced.  He tolerated this, and he was able to  be discharged.   DISPOSITION:  Discharged to home on October 16, 2007.  He already has  repair of a ventral incisional hernia scheduled in the near future and  thus we will keep that operation as is.  He is in satisfactory  condition.      Adolph Pollack, M.D.  Electronically Signed     TJR/MEDQ  D:  11/09/2007  T:  11/09/2007  Job:  [id number]

## 2011-05-13 NOTE — Op Note (Signed)
NAMELARRELL, RAPOZO NO.:  [id number]   MEDICAL RECORD NO.:  [id number]          PATIENT TYPE:  INP   LOCATION:  1304                         [facility]ACILITY:  [facility]   PHYSICIAN:  Adolph Pollack, M.D.DATE OF BIRTH:  [birth year]   DATE OF PROCEDURE:  12/18/2006  DATE OF DISCHARGE:                               OPERATIVE REPORT   PREOPERATIVE DIAGNOSIS:  Sigmoid diverticulitis with intra-abdominal  abscess and abdominal wall abscess.   POSTOPERATIVE DIAGNOSIS:  Sigmoid diverticulitis with intra-abdominal  abscess and abdominal wall abscess.   PROCEDURE:  Exploratory laparotomy, sigmoid colectomy with colostomy,  incision and drainage of left lower quadrant abdominal wall abscess.   SURGEON:  Dr. Abbey Chatters   ASSISTANT:  Dr. Baruch Merl   ANESTHESIA:  General.   INDICATIONS:  This is a 50 year old male, who had a diverticular abscess  percutaneously drained.  He did have a small fistulous tract between the  abscess cavity and the drain which eventually appeared to resolve, and  the drain fell out.  He did well for week to a week and a half, then  became ill toward the end the last week with fever, increasing left  flank pain, redness, and erythema.  He was started on some oral  antibiotics but became more ill over the weekend was admitted very early  this morning, and a repeat CT demonstrated an increasing left lower  abdominal wall inflammatory/infectious process as well as a persistent  abscess in the pelvis.  This was not amendable to percutaneous drainage.  He is now brought to the operating room for the above procedure.   TECHNIQUE:  He is brought to the operating room, placed supine on the  operating table, and general anesthetic was administered.  A Foley was  placed in his bladder.  The hair on the abdominal wall clipped, and the  area was widely prepped and draped.  I began above the umbilicus and  made a midline incision, dividing the skin,  subcutaneous tissue, fascia,  and peritoneum.  I then placed him in Trendelenburg position, and I  packed the small bowel superiorly.  I noticed that a very inflamed  segment of sigmoid colon densely adherent to the posterior area of the  mesentery.  I mobilized the sigmoid colon by dividing its lateral  attachments and using blunt dissection to medialize it.  I then  mobilized the descending colon by dividing its lateral attachment  superiorly.  At the descending colon and sigmoid colon junction, I  divided the bowel with the linear cutting stapler.  I then divided at  the rectosigmoid junction with a linear cutting stapler.  I then  carefully divided the mesentery with the LigaSure, staying close to the  bowel.  I identified the iliac vessels as well as an inflamed area that  I felt contained the ureter and stayed away from this.   Once I had freed this up, I noticed an abscess cavity which I drained  and cultured.  I then removed the specimen and sent the sigmoid colon  specimen to pathology.   Following this, some bleeding was noted,  and I oversewed 1 small vessel  with some Vicryl suture.  I then just packed off the wound, and this led  to adequate hemostasis.  Subsequently, I mobilized more of the  descending colon to allow me to have an adequate length for a colostomy.  I made a circular incision lateral to the umbilicus through the skin  then dissected the subcutaneous tissue and made a cruciate incision in  the anterior posterior rectus sheaths and brought the descending colon  end up and anchored it to the anterior fascia with interrupted Vicryl  sutures.  Following this, I irrigated out the abdominal cavity with 3  liters of saline solution.  I noted that hemostasis was adequate.  I  subsequently approached the previous left lower quadrant drain site.  I  made a large circular incision around the drain site and entered the  cavity and drained a large amount of purulent  material that went  down  to the fascia.  I suctioned all this out and then packed it off with a  sponge.   Following this, gloves were changed.  I removed all the intra-abdominal  sponges and confirmed this by count as well as instruments by count.  I  placed the omentum over the viscera and verified the NG tube position.  The fascia was then closed with a running #1 Novofil suture.  Intermittent staples were placed followed by moist gauze.  Following  this, I then matured the colostomy with interrupted 3-0 Vicryl sutures.  There was some bleeding from the colostomy; it appeared to be viable.  I  placed a stomal appliance over this.  Following that, I then placed a  1/2 inch Penrose drain down into the left lower quadrant wound deep just  until it was above the fascia and anchored it to the skin with nylon  suture.  I applied a very bulky dressing over the Penrose drain site.  Dry dressing was applied over the incision.   He tolerated the procedures well without any apparent complications.  I  did give him indigo carmine and did not notice any leakage, and the  urine quickly turned blue.  No injury to the ureter was noted.  He  subsequently was extubated and taken to the recovery room in  satisfactory condition.      Adolph Pollack, M.D.  Electronically Signed     TJR/MEDQ  D:  12/18/2006  T:  12/18/2006  Job:  [id number]   cc:   Stacie Acres. Cliffton Asters, M.D.  Fax: [phone]

## 2011-05-13 NOTE — H&P (Signed)
[name], [name] NO.:  [id number]   MEDICAL RECORD NO.:  [id number]          PATIENT TYPE:  INP   LOCATION:  1612                         FACILITY:  [facility]   PHYSICIAN:  Adolph Pollack, M.D.DATE OF BIRTH:  Oct 17, 1961   DATE OF ADMISSION:  05/04/2007  DATE OF DISCHARGE:                              HISTORY & PHYSICAL   REASON FOR ADMISSION:  Colostomy closure.   HISTORY OF PRESENT ILLNESS:  [name] is a 50 year old male who had  complicated sigmoid diverticulitis requiring a sigmoid colectomy and  colostomy.  He has had multiple wound problems, but they are improving.  He now presents for colostomy closure.  We discussed the procedure and  the risks preoperatively.   PAST MEDICAL HISTORY:  1. Complicated diverticulitis perforation.  2. Premature ventricular contractions.   PAST SURGICAL HISTORY:  1. Sigmoid colectomy/colostomy.  2. Left knee arthroscopy.  3. Tonsillectomy.   ALLERGIES:  ERYTHROMYCIN, PENICILLIN, CODEINE AND MORPHINE.   SOCIAL HISTORY:  He is a former smoker.  No alcohol use.   FAMILY HISTORY:  Notable for diabetes.   PHYSICAL EXAMINATION:  GENERAL:  A well-developed, well-nourished male  in no acute distress.  VITAL SIGNS:  Temperature 97.4, blood pressure 142/93, pulse 82.  HEENT:  Extraocular motions intact.  No icterus.  NECK:  Supple without masses.  RESPIRATORY:  Breath sounds equal and clear.  Respirations unlabored.  CARDIOVASCULAR:  Regular rate, regular rhythm.  No murmur heard.  ABDOMEN:  Abdomen is soft.  There is a left mid abdominal wall stoma.  There is a midline scar and the inferior aspect is open with a slight  amount of drainage.  MUSCULOSKELETAL:  STD hose on with good muscle tone.   IMPRESSION:  Colostomy and chronically draining of the wound.   PLAN:  Laparoscopic-assisted colostomy closure and wound debridement.      Adolph Pollack, M.D.  Electronically Signed     TJR/MEDQ  D:   05/04/2007  T:  05/04/2007  Job:  [id number]

## 2011-05-13 NOTE — Discharge Summary (Signed)
[name], [name]              ACCOUNT NO.:  [id number]   MEDICAL RECORD NO.:  [id number]          PATIENT TYPE:  INP   LOCATION:  1612                         [facility]ACILITY:  [facility]   PHYSICIAN:  Adolph Pollack, M.D.DATE OF BIRTH:  1961/02/12   DATE OF ADMISSION:  05/04/2007  DATE OF DISCHARGE:  05/10/2007                               DISCHARGE SUMMARY   FINAL DIAGNOSIS:  Colostomy.   SECONDARY DIAGNOSIS:  1. Acute allergic reaction, most likely to Percocet.  2. History of premature ventricular contractions.  3. Wound infection.   PROCEDURE:  Laparoscopic colostomy closure 05/04/2007.   REASON FOR ADMISSION:  [name] is a 50 year old male with complicated  diverticulitis requiring a sigmoid colectomy and colostomy.  He has had  wound problems, but they have eventually resolved  now.  He was admitted  for colostomy closure.   HOSPITAL COURSE:  He underwent the above procedure, which he actually  tolerated fairly well.  He had his Foley removed and started on a liquid  diet.  He was started on some Percocet and given IV pain medicine as  well, and his diet was advanced.  He had some cramps post passing some  gas and had some BMs.  He had some purulent drainage from the inferior  wound and a pocket was opened up and drained.  Both the colostomy wound  and the lower midline wound to which the anastomosis was done were left  open.   He developed a macular rash, which I felt was secondary to Percocet and  was having increasing pains.  A Gastrografin enema was performed  demonstrating no leak.  He is given Benadryl, as I felt this was  secondary to a drug rash.  He had some diarrhea and stool for C.  difficile was negative.  Eventually, all these things got better and  actually felt fairly good within 24 hours after this.  He was eating and  had had  a minimal fever that had come down.  Wounds were clean, and he  was discharged.   DISPOSITION:  Discharged to home 05/10/2007.   Home health nursing will  be obtained to assist with dressing changes.  He will go home on oral  Avelox with the cellulitis surrounding the open wound infection.  He is  given discharge instructions.  Follow up in the office with me in 1  week.      Adolph Pollack, M.D.  Electronically Signed     TJR/MEDQ  D:  05/31/2007  T:  05/31/2007  Job:  [id number]

## 2011-05-13 NOTE — Discharge Summary (Signed)
[name], David NO.:  [id number]   MEDICAL RECORD NO.:  [id number]          PATIENT TYPE:  INP   LOCATION:  1304                         FACILITY:  [facility]   PHYSICIAN:  Adolph Pollack, M.D.DATE OF BIRTH:  August 07, 1961   DATE OF ADMISSION:  12/17/2006  DATE OF DISCHARGE:  12/25/2006                               DISCHARGE SUMMARY   PRINCIPLE DISCHARGE DIAGNOSIS:  Complicated diverticulitis with  colocutaneous fistula.   SECONDARY DIAGNOSES:  1. Abdominal wall abscess.  2. History of premature ventricular contractions.  3. Postop ileus   PROCEDURE:  Exploratory laparotomy, sigmoid colectomy with colostomy,  incision and drainage of left lower quadrant abdominal wall abscess.   REASON FOR ADMISSION:  [name] is a 50 year old male who had a  complex sigmoid diverticulitis with abdominal abscess that was  percutaneously drained.  He was sent home with a drain and the drain  fell out.  He had been having some redness around the drain site and  then was noted to have what appeared to be a recurrent abscess.  He was  placed back on antibiotics but had been feeling worse and having fever.  He was noted to have a leukocytosis and was admitted.   HOSPITAL COURSE:  He was admitted, placed on IV antibiotics and taken to  the operating room for the above procedure.  Postoperatively, he had a  partially opened wound, and a Penrose drain in a left flank wound.  Dressing changes were started.  He had some ileus.  His fever and  leukocytosis  slowly resolved.  His ileus started resolving and his NG  tube was removed and he was started on a diet.  Colostomy teaching was  instituted.  His Penrose was subsequently removed.  His antibiotics were  changed to oral and on 12/25/2006, he was able to discharge to be  discharged to home and with advanced home care assisting with wound care  and colostomy teaching and care.   DISPOSITION:  Discharged to home on 12/25/2006.   He was given Cipro and  Flagyl and Vicodin for pain and told to take his usual home medications.  He will come and see me in 4 to 7 days in follow-up.  He will have  dressing changes and colostomy care per the home health nurses.      Adolph Pollack, M.D.  Electronically Signed     TJR/MEDQ  D:  01/18/2007  T:  01/18/2007  Job:  [id number]   cc:   Stacie Acres. Cliffton Asters, M.D.  Fax: [phone]

## 2011-05-13 NOTE — Consult Note (Signed)
[name], [name] NO.:  [id number]   MEDICAL RECORD NO.:  [id number]          PATIENT TYPE:  EMS   LOCATION:  ED                           FACILITY:  [facility]   PHYSICIAN:  Georga Hacking, M.D.DATE OF BIRTH:  [birth year]   DATE OF CONSULTATION:  01/09/2006  DATE OF DISCHARGE:                                   CONSULTATION   HISTORY OF PRESENT ILLNESS:  This 50 year old male is seen at the request of  the emergency room physician for evaluation of paroxysmal atrial flutter and  fibrillation.  The patient has a history of cigarette abuse and lifelong  palpitations and has had episodic palpitations.  He has had PAC and has had  two episodes of rapid heartbeat, resulting in emergency room visits,  diagnosed as atrial fibrillation in June of 2005.  He has been on atenolol  since that time and his atenolol dose was increased.  He has had occasional  episodes of skipped heartbeat and occasional flutters.  Yesterday after  having an argument with his wife and after having had several beers and a  [facility], he had the onset of significant tachycardia last evening that  persisted.  He awoke this morning and was continuing to have tachycardia and  had nausea, some chest discomfort, and shortness of breath and presented to  the emergency room.  He was found to be in atrial flutter with a rate of  156.  He was given adenosine 6 mg intravenously and then Cardizem 20 mg  intravenously.  He failed to convert to sinus rhythm but spontaneously  converted just a few minutes ago.  He currently feels fine.  He had a prior  history of a Cardiolite stress test done in 2005 that was negative for  ischemia.   PHYSICAL EXAMINATION:  GENERAL:  He is a pleasant obese male who is  currently in no acute distress.  VITAL SIGNS:  His blood pressure is currently 130/70, pulse was currently 80  and regular.  SKIN:  Warm and dry.  HEENT:  EOMI, PERRLA.  Sclerae clear.  Fundi not examined.   Pharynx  negative.  NECK:  Supple without masses.  No JVD, thyromegaly, or bruits.  LUNGS:  Clear.  CARDIAC:  Normal S1 and S2, no S3.  ABDOMEN:  Soft and nontender.  Distal pulses 2+.   LABORATORY DATA:  His 12-lead EKG showed atrial flutter with rapid response.  He is currently in sinus rhythm.   IMPRESSION:  Paroxysmal atrial fibrillation and flutter which has now  resolved.   RECOMMENDATIONS:  1.  He may be discharged home.  He should continue aspirin, anticoagulation,      and atenolol 50 mg daily.  2.  Because of the frequency of his episodes is sporadic, I gave him a      prescription for flecainide 150 mg and asked him to use this at the      onset of palpitations along with atenolol.  If the palpitations persist      more than an      hour, he should see [place] in  follow-up or go to the emergency room.  He is      instructed to call the office for an appointment for an event monitor      and I will see him in a few weeks.  Additional options for management of      atrial flutter would include catheter ablation or suppressive      antiarrhythmic therapy with flecainide.      Georga Hacking, M.D.  Electronically Signed     WST/MEDQ  D:  01/09/2006  T:  01/09/2006  Job:  [id number]   cc:   Stacie Acres. Cliffton Asters, M.D.  Fax: [phone]

## 2011-05-13 NOTE — H&P (Signed)
[name], [name] NO.:  [id number]   MEDICAL RECORD NO.:  [id number]          PATIENT TYPE:  EMS   LOCATION:  ED                           FACILITY:  [facility]   PHYSICIAN:  [name], M.D.DATE OF BIRTH:  1961-11-28   DATE OF ADMISSION:  10/28/2006  DATE OF DISCHARGE:                                HISTORY & PHYSICAL   CHIEF COMPLAINT:  Lower abdominal pain, fever.   HISTORY OF PRESENT ILLNESS:  This is a 50 year old male who about 48 hours  ago began having significant cramping, lower abdominal pain, and diarrhea  specifically after a meal.  He called his primary care physician's office  (Dr. Laurann Soto) and was given antispasmodic and told that he may have  gastrointestinal flu.  His pain has persisted and has had fever.  He  presented to the emergency department and was evaluated.  He was noted to be  febrile with a leukocytosis.  A CT scan was performed.  This demonstrates  inflammatory changes in the sigmoid colon and a contained pocket of air and  small amount of fluid in the mesentery in that region with a questionable  foreign body.  No obvious diverticular areas were noted, although there was  significant inflammatory change.  For this reason, I was asked to see him.  He has never had anything this before.  He has no appetite at this time.   PAST MEDICAL HISTORY:  PVCs.   PREVIOUS OPERATIONS:  Left knee arthroscopy.   ALLERGIES:  ERYTHROMYCIN and PENICILLIN.   MEDICATIONS:  Atenolol, aspirin, Bentyl p.r.n.   SOCIAL HISTORY:  He was a smoker; in fact, he just quit very recently.  Denies alcohol use.  Married, here with his wife.   FAMILY HISTORY:  Positive for diabetes, PVCs in a sister, diverticulitis in  his mother.   REVIEW OF SYSTEMS:  CARDIOVASCULAR:  Denies heart disease or hypertension.  PULMONARY:  Denies any dyspnea, pneumonia, emphysema or asthma.  GI: He  denies peptic ulcer disease, hiatal hernia reflux, blood in the  stool,  hepatitis or jaundice.  ENDOCRINE:  No diabetes.  HEMATOLOGIC: No bleeding  disorders or blood clots.  NEUROLOGIC:  No strokes or seizures.   PHYSICAL EXAMINATION:  GENERAL: A mildly ill-appearing male, very pleasant  and cooperative.  VITAL SIGNS: His temperature is 100.2 currently. His blood pressure is  110/64, pulse 93, respiratory rate 18.  EYES:  Extraocular motions intact.  No icterus.  NECK: Supple without obvious masses.  RESPIRATORY:  The breath sounds are equal, and clear respirations are  unlabored.  CARDIOVASCULAR:  Heart demonstrates a regular rate, regular rhythm.  I do  not hear a murmur.  No JVD.  No lower extremity edema.  ABDOMEN: Soft with a mild diffuse tenderness with most of the tenderness in  the lower abdomen.  There are no peritoneal signs present.  Hypoactive bowel  sounds noted.  No obvious masses.  Abdomen is moderately obese.  EXTREMITIES:  Good range of motion.  No edema.  SKIN: Flushed.  NEUROLOGIC:  He is alert and oriented, answers questions appropriately.  LABORATORY DATA:  His white blood cell count is noted to be 16,700.  Initial  glucose was elevated at 233.  Potassium is also quite low, but this was  repeated and was in the normal range.  Liver function tests are not  elevated.   CT scan was reviewed with Dr. Arbie Soto of radiology.  This demonstrates  inflammatory changes of the sigmoid colon with some extraluminal air. No  extravasation of contrast, a very small amount of fluid present.   IMPRESSION:  Acute sigmoid diverticulitis with focal perforation versus  small foreign body perforation.  In questioning him, he says he was using a  plastic toothpick and may have or may not have swallowed the tip.  Does not  have peritonitis at this time.   PLAN:  I told him he had 2 options.  The first option was intravenous  antibiotics and bowel rest and see if he would improve with this.  The  second option would be exploratory laparotomy,  sigmoid colectomy and  colostomy.  I told him I felt we should try the first option, and if he did  not improve, then we would have to consider performing the exploratory  laparotomy. Also if in a couple of days he did not improve or plateau, we  could repeat the CT.  It it had a walled-off abscess, this potentially could  be percutaneously drained. Both he and his wife seem to understand and agree  with the plan.      [name], M.D.  Electronically Signed     TJR/MEDQ  D:  10/28/2006  T:  10/29/2006  Job:  [id number]

## 2011-09-30 LAB — POCT I-STAT, CHEM 8
BUN: 18 mg/dL (ref 6–23)
Calcium, Ion: 1.17 mmol/L (ref 1.12–1.32)
Chloride: 107 mEq/L (ref 96–112)
Creatinine, Ser: 0.9 mg/dL (ref 0.4–1.5)
Glucose, Bld: 102 mg/dL — ABNORMAL HIGH (ref 70–99)
HCT: 51 % (ref 39.0–52.0)
Hemoglobin: 17.3 g/dL — ABNORMAL HIGH (ref 13.0–17.0)
Potassium: 4.3 mEq/L (ref 3.5–5.1)
Sodium: 142 mEq/L (ref 135–145)
TCO2: 23 mmol/L (ref 0–100)

## 2011-10-04 LAB — APTT: aPTT: 27

## 2011-10-04 LAB — BASIC METABOLIC PANEL
BUN: 14
BUN: 19
CO2: 25
CO2: 31
Calcium: 8.3 — ABNORMAL LOW
Calcium: 9.1
Chloride: 106
Chloride: 112
Creatinine, Ser: 0.7
Creatinine, Ser: 0.82
GFR calc Af Amer: >60
GFR calc Af Amer: >60
GFR calc non Af Amer: >60
GFR calc non Af Amer: >60
Glucose, Bld: 134 — ABNORMAL HIGH
Glucose, Bld: 145 — ABNORMAL HIGH
Potassium: 4.1
Potassium: 4.1
Sodium: 143
Sodium: 143

## 2011-10-04 LAB — CLOSTRIDIUM DIFFICILE EIA: C difficile Toxins A+B, EIA: NEGATIVE

## 2011-10-04 LAB — COMPREHENSIVE METABOLIC PANEL
ALT: 130 — ABNORMAL HIGH
ALT: 62 — ABNORMAL HIGH
AST: 40 — ABNORMAL HIGH
AST: 62 — ABNORMAL HIGH
Albumin: 2.6 — ABNORMAL LOW
Albumin: 2.7 — ABNORMAL LOW
Alkaline Phosphatase: 65
Alkaline Phosphatase: 68
BUN: 11
BUN: 18
CO2: 24
CO2: 25
Calcium: 8.4
Calcium: 8.7
Chloride: 105
Chloride: 111
Creatinine, Ser: 0.62
Creatinine, Ser: 0.78
GFR calc Af Amer: >60
GFR calc Af Amer: >60
GFR calc non Af Amer: >60
GFR calc non Af Amer: >60
Glucose, Bld: 102 — ABNORMAL HIGH
Glucose, Bld: 140 — ABNORMAL HIGH
Potassium: 4.2
Potassium: 4.3
Sodium: 136
Sodium: 141
Total Bilirubin: 0.7
Total Bilirubin: 0.9
Total Protein: 6
Total Protein: 6.6

## 2011-10-04 LAB — CBC
HCT: 34.7 — ABNORMAL LOW
HCT: 35.5 — ABNORMAL LOW
HCT: 36.6 — ABNORMAL LOW
HCT: 39.5
Hemoglobin: 12.1 — ABNORMAL LOW
Hemoglobin: 12.1 — ABNORMAL LOW
Hemoglobin: 12.6 — ABNORMAL LOW
Hemoglobin: 13.2
MCHC: 33.6
MCHC: 34.1
MCHC: 34.4
MCHC: 34.8
MCV: 86.4
MCV: 87
MCV: 87.2
MCV: 88.1
Platelets: 240
Platelets: 244
Platelets: 246
Platelets: 275
RBC: 4.02 — ABNORMAL LOW
RBC: 4.08 — ABNORMAL LOW
RBC: 4.21 — ABNORMAL LOW
RBC: 4.48
RDW: 13.6
RDW: 13.7
RDW: 13.8
RDW: 14.2 — ABNORMAL HIGH
WBC: 10.9 — ABNORMAL HIGH
WBC: 12.8 — ABNORMAL HIGH
WBC: 15.2 — ABNORMAL HIGH
WBC: 15.8 — ABNORMAL HIGH

## 2011-10-04 LAB — DIFFERENTIAL
Basophils Absolute: 0
Basophils Relative: 0
Eosinophils Absolute: 0.2
Eosinophils Relative: 1
Lymphocytes Relative: 11 — ABNORMAL LOW
Lymphs Abs: 1.8
Monocytes Absolute: 0.7
Monocytes Relative: 4
Neutro Abs: 13.2 — ABNORMAL HIGH
Neutrophils Relative %: 83 — ABNORMAL HIGH

## 2011-10-04 LAB — TRIGLYCERIDES
Triglycerides: 103
Triglycerides: 87

## 2011-10-04 LAB — PROTIME-INR
INR: 1
Prothrombin Time: 13.4

## 2011-10-04 LAB — CHOLESTEROL, TOTAL: Cholesterol: 100

## 2011-10-04 LAB — BODY FLUID CULTURE
Culture: NO GROWTH
Gram Stain: NONE SEEN

## 2011-10-04 LAB — PHOSPHORUS
Phosphorus: 3.5
Phosphorus: 3.8

## 2011-10-04 LAB — MAGNESIUM
Magnesium: 2
Magnesium: 2.1

## 2011-10-04 LAB — PREALBUMIN
Prealbumin: 11.9 — ABNORMAL LOW
Prealbumin: 14 — ABNORMAL LOW

## 2011-10-05 LAB — COMPREHENSIVE METABOLIC PANEL
ALT: 23
ALT: 34
ALT: 42
ALT: 54 — ABNORMAL HIGH
ALT: 79 — ABNORMAL HIGH
AST: 15
AST: 24
AST: 25
AST: 28
AST: 31
Albumin: 2.7 — ABNORMAL LOW
Albumin: 3.4 — ABNORMAL LOW
Albumin: 3.4 — ABNORMAL LOW
Albumin: 4.1
Albumin: 4.1
Alkaline Phosphatase: 41
Alkaline Phosphatase: 45
Alkaline Phosphatase: 45
Alkaline Phosphatase: 55
Alkaline Phosphatase: 56
BUN: 13
BUN: 15
BUN: 19
BUN: 5 — ABNORMAL LOW
BUN: 9
CO2: 24
CO2: 24
CO2: 27
CO2: 30
CO2: 34 — ABNORMAL HIGH
Calcium: 8.5
Calcium: 8.7
Calcium: 8.8
Calcium: 9.6
Calcium: 9.9
Chloride: 104
Chloride: 106
Chloride: 97
Chloride: 99
Chloride: 99
Creatinine, Ser: 0.68
Creatinine, Ser: 0.84
Creatinine, Ser: 0.88
Creatinine, Ser: 0.9
Creatinine, Ser: 0.91
GFR calc Af Amer: >60
GFR calc Af Amer: >60
GFR calc Af Amer: >60
GFR calc Af Amer: >60
GFR calc Af Amer: >60
GFR calc non Af Amer: >60
GFR calc non Af Amer: >60
GFR calc non Af Amer: >60
GFR calc non Af Amer: >60
GFR calc non Af Amer: >60
Glucose, Bld: 107 — ABNORMAL HIGH
Glucose, Bld: 114 — ABNORMAL HIGH
Glucose, Bld: 123 — ABNORMAL HIGH
Glucose, Bld: 93
Glucose, Bld: 99
Potassium: 3.5
Potassium: 3.6
Potassium: 3.6
Potassium: 3.9
Potassium: 4.1
Sodium: 135
Sodium: 136
Sodium: 136
Sodium: 137
Sodium: 143
Total Bilirubin: 0.9
Total Bilirubin: 0.9
Total Bilirubin: 0.9
Total Bilirubin: 1
Total Bilirubin: 1.1
Total Protein: 5.7 — ABNORMAL LOW
Total Protein: 6.2
Total Protein: 6.4
Total Protein: 7.4
Total Protein: 7.8

## 2011-10-05 LAB — CBC
HCT: 40.1
HCT: 42.3
HCT: 43.9
HCT: 45.1
HCT: 46.2
HCT: 48.1
Hemoglobin: 13.8
Hemoglobin: 14.4
Hemoglobin: 15.5
Hemoglobin: 15.5
Hemoglobin: 15.8
Hemoglobin: 16.8
MCHC: 34.1
MCHC: 34.2
MCHC: 34.3
MCHC: 34.4
MCHC: 34.9
MCHC: 35.2
MCV: 87.3
MCV: 87.5
MCV: 87.9
MCV: 88.4
MCV: 88.6
MCV: 88.7
Platelets: 209
Platelets: 229
Platelets: 238
Platelets: 245
Platelets: 265
Platelets: 278
RBC: 4.52
RBC: 4.78
RBC: 5.02
RBC: 5.13
RBC: 5.23
RBC: 5.51
RDW: 13.4
RDW: 13.4
RDW: 13.7
RDW: 14
RDW: 14
RDW: 14.3 — ABNORMAL HIGH
WBC: 10.1
WBC: 10.8 — ABNORMAL HIGH
WBC: 12.3 — ABNORMAL HIGH
WBC: 16.2 — ABNORMAL HIGH
WBC: 8
WBC: 8.2

## 2011-10-05 LAB — TYPE AND SCREEN
ABO/RH(D): O POS
Antibody Screen: NEGATIVE

## 2011-10-05 LAB — BASIC METABOLIC PANEL
BUN: 10
BUN: 13
BUN: 18
BUN: 20
BUN: 7
BUN: 9
CO2: 25
CO2: 27
CO2: 27
CO2: 29
CO2: 30
CO2: 32
Calcium: 8.5
Calcium: 8.6
Calcium: 8.7
Calcium: 8.8
Calcium: 8.8
Calcium: 8.9
Chloride: 101
Chloride: 102
Chloride: 103
Chloride: 106
Chloride: 106
Chloride: 98
Creatinine, Ser: 0.68
Creatinine, Ser: 0.69
Creatinine, Ser: 0.73
Creatinine, Ser: 0.8
Creatinine, Ser: 0.83
Creatinine, Ser: 0.88
GFR calc Af Amer: >60
GFR calc Af Amer: >60
GFR calc Af Amer: >60
GFR calc Af Amer: >60
GFR calc Af Amer: >60
GFR calc Af Amer: >60
GFR calc non Af Amer: >60
GFR calc non Af Amer: >60
GFR calc non Af Amer: >60
GFR calc non Af Amer: >60
GFR calc non Af Amer: >60
GFR calc non Af Amer: >60
Glucose, Bld: 115 — ABNORMAL HIGH
Glucose, Bld: 116 — ABNORMAL HIGH
Glucose, Bld: 122 — ABNORMAL HIGH
Glucose, Bld: 129 — ABNORMAL HIGH
Glucose, Bld: 129 — ABNORMAL HIGH
Glucose, Bld: 129 — ABNORMAL HIGH
Potassium: 3.6
Potassium: 3.8
Potassium: 3.9
Potassium: 4.1
Potassium: 4.2
Potassium: 4.3
Sodium: 136
Sodium: 136
Sodium: 137
Sodium: 140
Sodium: 142
Sodium: 142

## 2011-10-05 LAB — DIFFERENTIAL
Basophils Absolute: 0
Basophils Absolute: 0
Basophils Absolute: 0.1
Basophils Relative: 0
Basophils Relative: 0
Basophils Relative: 0
Eosinophils Absolute: 0
Eosinophils Absolute: 0.2
Eosinophils Absolute: 0.2
Eosinophils Relative: 0
Eosinophils Relative: 2
Eosinophils Relative: 3
Lymphocytes Relative: 18
Lymphocytes Relative: 24
Lymphocytes Relative: 9 — ABNORMAL LOW
Lymphs Abs: 1.5
Lymphs Abs: 1.8
Lymphs Abs: 2
Monocytes Absolute: 0.5
Monocytes Absolute: 0.5
Monocytes Absolute: 0.8 — ABNORMAL HIGH
Monocytes Relative: 3
Monocytes Relative: 7
Monocytes Relative: 8
Neutro Abs: 14.1 — ABNORMAL HIGH
Neutro Abs: 5.4
Neutro Abs: 7.4
Neutrophils Relative %: 66
Neutrophils Relative %: 73
Neutrophils Relative %: 87 — ABNORMAL HIGH

## 2011-10-05 LAB — PHOSPHORUS
Phosphorus: 3.2
Phosphorus: 3.8
Phosphorus: 3.9
Phosphorus: 4
Phosphorus: 4

## 2011-10-05 LAB — TRIGLYCERIDES
Triglycerides: 58
Triglycerides: 85

## 2011-10-05 LAB — MAGNESIUM
Magnesium: 1.9
Magnesium: 1.9
Magnesium: 2.1
Magnesium: 2.1

## 2011-10-05 LAB — LIPASE, BLOOD: Lipase: 40

## 2011-10-05 LAB — PREALBUMIN
Prealbumin: 15.5 — ABNORMAL LOW
Prealbumin: 7.5 — ABNORMAL LOW

## 2011-10-05 LAB — CHOLESTEROL, TOTAL: Cholesterol: 96

## 2011-10-07 LAB — CBC
HCT: 44
Hemoglobin: 15.3
MCHC: 34.8
MCV: 87.3
Platelets: 233
RBC: 5.05
RDW: 13.8
WBC: 11.4 — ABNORMAL HIGH

## 2011-10-07 LAB — DIFFERENTIAL
Basophils Absolute: 0
Basophils Relative: 0
Eosinophils Absolute: 0.3
Eosinophils Relative: 2
Lymphocytes Relative: 15
Lymphs Abs: 1.8
Monocytes Absolute: 0.7
Monocytes Relative: 7
Neutro Abs: 8.6 — ABNORMAL HIGH
Neutrophils Relative %: 76

## 2011-10-07 LAB — BASIC METABOLIC PANEL
BUN: 15
CO2: 23
Calcium: 8.8
Chloride: 109
Creatinine, Ser: 0.68
GFR calc Af Amer: >60
GFR calc non Af Amer: >60
Glucose, Bld: 94
Potassium: 4.2
Sodium: 140

## 2011-10-07 LAB — POCT CARDIAC MARKERS
CKMB, poc: <1 — ABNORMAL LOW
CKMB, poc: <1 — ABNORMAL LOW
Myoglobin, poc: 46.2
Myoglobin, poc: 53.2
Operator id: 1627
Operator id: 1627
Troponin i, poc: <0.05
Troponin i, poc: <0.05

## 2012-02-28 ENCOUNTER — Other Ambulatory Visit: Payer: Self-pay | Admitting: Cardiology

## 2012-07-26 ENCOUNTER — Encounter (INDEPENDENT_AMBULATORY_CARE_PROVIDER_SITE_OTHER): Payer: Self-pay | Admitting: General Surgery

## 2012-07-26 ENCOUNTER — Ambulatory Visit (INDEPENDENT_AMBULATORY_CARE_PROVIDER_SITE_OTHER): Payer: 59 | Admitting: General Surgery

## 2012-07-26 ENCOUNTER — Telehealth (INDEPENDENT_AMBULATORY_CARE_PROVIDER_SITE_OTHER): Payer: Self-pay

## 2012-07-26 VITALS — BP 136/68 | HR 56 | Temp 97.4°F | Resp 16 | Ht 69.0 in | Wt 231.2 lb

## 2012-07-26 DIAGNOSIS — R197 Diarrhea, unspecified: Secondary | ICD-10-CM | POA: Insufficient documentation

## 2012-07-26 DIAGNOSIS — R109 Unspecified abdominal pain: Secondary | ICD-10-CM

## 2012-07-26 NOTE — Progress Notes (Signed)
The patient is a former surgical patient of Dr. Avel Peace.in 2008 he had a colectomy and colostomy with a subsequent takedown of his colostomy. He then developed abdominal wall hernias which have been repaired associated with bowel obstruction.  Starting about 4 weeks ago ankle once again with being stung by some decent patient started having more difficulty with his bowel movements. He noticed it changing caliber and also change in frequency with his bowel movements.  He's noticed no significant pain or discomfort although he does feel as though he may have pulled a muscle in his back he's had no fevers or chills. He has had diarrhea off to 2 times per hour and mostly liquid which is been exacerbated recently. He did take a stool softener previously when he started to have symptoms however he stopped that since he started having more loose bowel movements. His appetite is good he's had no blood in his stool. And he has not seen a gastroenterologist or his primary care physician concerning this problem.  On examination the patient has a large abdominal wall eventration at the site of his previous center hernia. He has no palpable mass or or tenderness. He's got good bowel sounds. I did not perform a rectal examination on the patient.  His symptoms are not really indicative of acute diverticulitis although with his history I do think that a CT scan of the abdomen and pelvis would be useful. I do think that the patient needs to be referred to gastroenterologist for followup and perhaps a colonoscopy since he has never had one from the time that he had his previous abdominal surgery.  We will go and get a CT scan of his abdomen and pelvis with contrast.also try to get him an appointment to see a gastroenterologist in the near future. His followup should be with Dr. Abbey Chatters here and with the gastroenterologist in the near future.

## 2012-07-26 NOTE — Telephone Encounter (Signed)
Left message to call me back so i can give him appointment information for his referral. I set him up to see Dr. Bosie Clos @ [facility] for August 29th @ 2:30. If patient calls back please give appointment information to him.

## 2012-07-30 ENCOUNTER — Other Ambulatory Visit: Payer: Self-pay

## 2012-08-13 ENCOUNTER — Encounter (INDEPENDENT_AMBULATORY_CARE_PROVIDER_SITE_OTHER): Payer: 59 | Admitting: General Surgery

## 2014-08-05 ENCOUNTER — Emergency Department (HOSPITAL_COMMUNITY)
Admission: EM | Admit: 2014-08-05 | Discharge: 2014-08-05 | Disposition: A | Discharge status: Home / Self Care | Payer: Worker's Compensation | Attending: Emergency Medicine | Admitting: Emergency Medicine

## 2014-08-05 ENCOUNTER — Encounter (HOSPITAL_COMMUNITY): Payer: Self-pay | Admitting: Emergency Medicine

## 2014-08-05 ENCOUNTER — Emergency Department (HOSPITAL_COMMUNITY): Payer: Worker's Compensation

## 2014-08-05 DIAGNOSIS — X503XXA Overexertion from repetitive movements, initial encounter: Secondary | ICD-10-CM | POA: Insufficient documentation

## 2014-08-05 DIAGNOSIS — Y9389 Activity, other specified: Secondary | ICD-10-CM | POA: Diagnosis not present

## 2014-08-05 DIAGNOSIS — Z79899 Other long term (current) drug therapy: Secondary | ICD-10-CM | POA: Insufficient documentation

## 2014-08-05 DIAGNOSIS — Z880 Allergy status to penicillin: Secondary | ICD-10-CM | POA: Diagnosis not present

## 2014-08-05 DIAGNOSIS — Z791 Long term (current) use of non-steroidal anti-inflammatories (NSAID): Secondary | ICD-10-CM | POA: Insufficient documentation

## 2014-08-05 DIAGNOSIS — S3992XA Unspecified injury of lower back, initial encounter: Secondary | ICD-10-CM

## 2014-08-05 DIAGNOSIS — Z8739 Personal history of other diseases of the musculoskeletal system and connective tissue: Secondary | ICD-10-CM | POA: Diagnosis not present

## 2014-08-05 DIAGNOSIS — Y9289 Other specified places as the place of occurrence of the external cause: Secondary | ICD-10-CM | POA: Insufficient documentation

## 2014-08-05 DIAGNOSIS — IMO0002 Reserved for concepts with insufficient information to code with codable children: Secondary | ICD-10-CM | POA: Insufficient documentation

## 2014-08-05 DIAGNOSIS — F17200 Nicotine dependence, unspecified, uncomplicated: Secondary | ICD-10-CM | POA: Diagnosis not present

## 2014-08-05 DIAGNOSIS — X500XXA Overexertion from strenuous movement or load, initial encounter: Secondary | ICD-10-CM | POA: Insufficient documentation

## 2014-08-05 LAB — URINALYSIS, ROUTINE W REFLEX MICROSCOPIC
Bilirubin Urine: NEGATIVE
Glucose, UA: NEGATIVE mg/dL
Hgb urine dipstick: NEGATIVE
Ketones, ur: NEGATIVE mg/dL
Leukocytes, UA: NEGATIVE
Nitrite: NEGATIVE
Protein, ur: NEGATIVE mg/dL
Specific Gravity, Urine: 1.028 (ref 1.005–1.030)
Urobilinogen, UA: 0.2 mg/dL (ref 0.0–1.0)
pH: 5 (ref 5.0–8.0)

## 2014-08-05 MED ORDER — KETOROLAC TROMETHAMINE 60 MG/2ML IM SOLN
60.0000 mg | Freq: Once | INTRAMUSCULAR | Status: AC
  Administered 2014-08-05: 60 mg via INTRAMUSCULAR
  Filled 2014-08-05: qty 2

## 2014-08-05 MED ORDER — HYDROCODONE-ACETAMINOPHEN 5-325 MG PO TABS: 2.0000 | ORAL_TABLET | ORAL | Status: DC | PRN

## 2014-08-05 NOTE — ED Notes (Signed)
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  Pt is ambulatory and denies loss of control of is bowels.  Pt does endorse nausea and pain upon urination.

## 2014-08-05 NOTE — ED Provider Notes (Signed)
CSN: [id number]     Arrival date & time 08/05/14  9371 History   First MD Initiated Contact with Patient 08/05/14 0703     Chief Complaint  Patient presents with  . 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 The pain is sharp and severe and radiates down his left leg. The pain is constant. Movement makes the pain worse. Nothing makes the pain better. Patient has not tried anything for pain. No associated symptoms. No saddle paresthesias or bladder/bowel incontinence.      Past Medical History  Diagnosis Date  . Arthritis    Past Surgical History  Procedure Laterality Date  . Neck fusion  5/11  . Knee arthroscopy  5/10    x2  . Colon surgery  12/07, 5/08, 10/09   No family history on file. History  Substance Use Topics  . Smoking status: Current Every Day Smoker -- 0.50 packs/day for 2 years    Types: Cigarettes  . Smokeless tobacco: Never Used  . Alcohol Use: Yes     Comment: 2 beers/ day    Review of Systems  Constitutional: Negative for fever, chills and fatigue.  HENT: Negative for trouble swallowing.   Eyes: Negative for visual disturbance.  Respiratory: Negative for shortness of breath.   Cardiovascular: Negative for chest pain and palpitations.  Gastrointestinal: Negative for nausea, vomiting, abdominal pain and diarrhea.  Genitourinary: Negative for dysuria and difficulty urinating.  Musculoskeletal: Positive for back pain. Negative for arthralgias and neck pain.  Skin: Negative for color change.  Neurological: Negative for dizziness and weakness.  Psychiatric/Behavioral: Negative for dysphoric mood.        Allergies  Erythromycin; Morphine and related; Penicillins; Percocet; and Z-pak  Home Medications   Prior to Admission medications   Medication Sig Start Date End Date Taking? Authorizing Provider  atenolol (TENORMIN) 50 MG tablet Take 50 mg by mouth daily.   Yes Historical Provider, MD  diclofenac (VOLTAREN) 50 MG EC tablet Take 50 mg by mouth 2 (two) times daily.   Yes Historical Provider, MD  diphenhydrAMINE (BENADRYL) 25 MG tablet Take 25 mg by mouth every 6 (six) hours as needed for allergies or sleep.   Yes Historical Provider, MD  Multiple Vitamin (MULTIVITAMIN WITH MINERALS) TABS tablet Take 1 tablet by mouth daily.   Yes Historical Provider, MD  traMADol (ULTRAM) 50 MG tablet Take 100 mg by mouth every 6 (six) hours as needed for moderate pain.   Yes Historical Provider, MD   BP 128/82  Pulse 81  Temp(Src) 97.9 F (36.6 C) (Oral)  Resp 18  SpO2 96% Physical Exam  Nursing note and vitals reviewed. Constitutional: He is oriented to person, place, and time. He appears well-developed and well-nourished. No distress.  HENT:  Head: Normocephalic and atraumatic.  Eyes: Conjunctivae and EOM are normal.  Neck: Normal range of motion.  Cardiovascular: Normal rate and regular rhythm.  Exam reveals no gallop and no friction rub.   No murmur heard. Pulmonary/Chest: Effort normal and breath sounds normal. He has no wheezes. He has no rales. He exhibits no tenderness.  Abdominal: Soft. He exhibits no distension. There is no tenderness. There is no  rebound and no guarding.  Musculoskeletal: Normal range of motion.       Arms: No midline spine tenderness to palpation. Left lumboscaral paraspinal tenderness to palpation.   Neurological: He is alert and oriented to person, place, and time. Coordination normal.  Lower extremity strength and sensation equal and intact bilaterally. Speech is goal-oriented. Moves limbs without ataxia.   Skin: Skin is warm and dry.  Psychiatric: He has  a normal mood and affect. His behavior is normal.    ED Course  Procedures (including critical care time) Labs Review Labs Reviewed  URINALYSIS, ROUTINE W REFLEX MICROSCOPIC    Imaging Review Dg Lumbar Spine Complete  08/05/2014   CLINICAL DATA:  Mid back pain after lifting injury.  EXAM: LUMBAR SPINE - COMPLETE 4+ VIEW  COMPARISON:  CT abdomen and pelvis 10/30/2007.  FINDINGS: There is no fracture. Scattered Schmorl's nodes are noted. Trace anterolisthesis L4 on L5 due to facet degenerative disease is identified. There is some loss of disc space height at L4-5 and L5-S1. Atherosclerotic vascular disease is noted.  IMPRESSION: No acute finding.  Lumbar spondylosis L4-5 and L5-S1.   Electronically Signed   By: Drusilla Kannerhomas  Dalessio M.D.   On: 08/05/2014 08:07   Dg Sacrum/coccyx  08/05/2014   CLINICAL DATA:  Lower back pain radiating into left leg  EXAM: SACRUM AND COCCYX - 2+ VIEW  COMPARISON:  Concurrently obtained radiographs of the lumbar spine 08/05/2014  FINDINGS: There is no evidence of fracture or other focal bone lesions. Helical surgical clips suggest prior laparoscopic ventral hernia repair. Trace aortic atherosclerotic calcifications.  IMPRESSION: 1. No acute fracture or malalignment. 2. Aortic atherosclerosis.   Electronically Signed   By: Malachy MoanHeath  McCullough M.D.   On: 08/05/2014 08:09     EKG Interpretation None      MDM   Final diagnoses:  Back injury, initial encounter    8:14 AM Xrays unremarkable for acute changes. Patient given IM toradol for pain. Vitals stable and patient afebrile. No bladder/bowel incontinence or saddle paresthesias.   9:50 AM Patient reports some relief of pain. Patient will have Vicodin prescription for pain. Patient instructed to rest and follow up with PCP. Patient is able to ambulate with pain.    Emilia BeckKaitlyn Tracye Szuch, PA-C 08/05/14 1005

## 2014-08-05 NOTE — Discharge Instructions (Signed)
Take Vicodin as needed for pain. Refer to attached documents for more information. Follow up with your doctor for further evaluation and management.

## 2014-08-05 NOTE — ED Provider Notes (Signed)
Medical screening examination/treatment/procedure(s) were performed by non-physician practitioner and as supervising physician I was immediately available for consultation/collaboration.   EKG Interpretation None        Lyanne Co, MD 08/05/14 1011

## 2014-10-27 ENCOUNTER — Other Ambulatory Visit: Payer: Self-pay | Admitting: Dermatology

## 2014-11-11 ENCOUNTER — Other Ambulatory Visit: Payer: Self-pay | Admitting: Orthopedic Surgery

## 2014-11-18 ENCOUNTER — Ambulatory Visit (HOSPITAL_COMMUNITY)
Admission: RE | Admit: 2014-11-18 | Discharge: 2014-11-18 | Disposition: A | Discharge status: Home / Self Care | Payer: Worker's Compensation | Source: Ambulatory Visit | Attending: Orthopedic Surgery | Admitting: Orthopedic Surgery

## 2014-11-18 ENCOUNTER — Encounter (HOSPITAL_COMMUNITY): Payer: Self-pay

## 2014-11-18 ENCOUNTER — Encounter (HOSPITAL_COMMUNITY)
Admission: RE | Admit: 2014-11-18 | Discharge: 2014-11-18 | Disposition: A | Discharge status: Home / Self Care | Payer: Worker's Compensation | Source: Ambulatory Visit | Attending: Orthopedic Surgery | Admitting: Orthopedic Surgery

## 2014-11-18 DIAGNOSIS — J9811 Atelectasis: Secondary | ICD-10-CM | POA: Diagnosis not present

## 2014-11-18 DIAGNOSIS — Z981 Arthrodesis status: Secondary | ICD-10-CM | POA: Insufficient documentation

## 2014-11-18 DIAGNOSIS — Z01818 Encounter for other preprocedural examination: Secondary | ICD-10-CM | POA: Diagnosis not present

## 2014-11-18 DIAGNOSIS — J984 Other disorders of lung: Secondary | ICD-10-CM | POA: Insufficient documentation

## 2014-11-18 DIAGNOSIS — F17200 Nicotine dependence, unspecified, uncomplicated: Secondary | ICD-10-CM | POA: Insufficient documentation

## 2014-11-18 DIAGNOSIS — I4891 Unspecified atrial fibrillation: Secondary | ICD-10-CM | POA: Insufficient documentation

## 2014-11-18 HISTORY — DX: Diverticulosis of intestine, part unspecified, without perforation or abscess without bleeding: K57.90

## 2014-11-18 LAB — CBC WITH DIFFERENTIAL/PLATELET
Basophils Absolute: 0 10*3/uL (ref 0.0–0.1)
Basophils Relative: 0 % (ref 0–1)
Eosinophils Absolute: 0.2 10*3/uL (ref 0.0–0.7)
Eosinophils Relative: 2 % (ref 0–5)
HCT: 45.9 % (ref 39.0–52.0)
Hemoglobin: 15.4 g/dL (ref 13.0–17.0)
Lymphocytes Relative: 26 % (ref 12–46)
Lymphs Abs: 2.6 10*3/uL (ref 0.7–4.0)
MCH: 30.6 pg (ref 26.0–34.0)
MCHC: 33.6 g/dL (ref 30.0–36.0)
MCV: 91.1 fL (ref 78.0–100.0)
Monocytes Absolute: 0.6 10*3/uL (ref 0.1–1.0)
Monocytes Relative: 6 % (ref 3–12)
Neutro Abs: 6.7 10*3/uL (ref 1.7–7.7)
Neutrophils Relative %: 66 % (ref 43–77)
Platelets: 218 10*3/uL (ref 150–400)
RBC: 5.04 MIL/uL (ref 4.22–5.81)
RDW: 13.7 % (ref 11.5–15.5)
WBC: 10.1 10*3/uL (ref 4.0–10.5)

## 2014-11-18 LAB — COMPREHENSIVE METABOLIC PANEL
ALT: 43 U/L (ref 0–53)
AST: 24 U/L (ref 0–37)
Albumin: 4.2 g/dL (ref 3.5–5.2)
Alkaline Phosphatase: 64 U/L (ref 39–117)
Anion gap: 15 (ref 5–15)
BUN: 17 mg/dL (ref 6–23)
CO2: 23 mEq/L (ref 19–32)
Calcium: 9.5 mg/dL (ref 8.4–10.5)
Chloride: 102 mEq/L (ref 96–112)
Creatinine, Ser: 0.67 mg/dL (ref 0.50–1.35)
GFR calc Af Amer: >90 mL/min (ref >90)
GFR calc non Af Amer: >90 mL/min (ref >90)
Glucose, Bld: 89 mg/dL (ref 70–99)
Potassium: 4.2 mEq/L (ref 3.7–5.3)
Sodium: 140 mEq/L (ref 137–147)
Total Bilirubin: 0.2 mg/dL — ABNORMAL LOW (ref 0.3–1.2)
Total Protein: 7.7 g/dL (ref 6.0–8.3)

## 2014-11-18 LAB — SURGICAL PCR SCREEN
MRSA, PCR: NEGATIVE
Staphylococcus aureus: POSITIVE — AB

## 2014-11-18 LAB — PROTIME-INR
INR: 0.95 (ref 0.00–1.49)
Prothrombin Time: 12.7 seconds (ref 11.6–15.2)

## 2014-11-18 LAB — URINALYSIS, ROUTINE W REFLEX MICROSCOPIC
Bilirubin Urine: NEGATIVE
Glucose, UA: NEGATIVE mg/dL
Hgb urine dipstick: NEGATIVE
Ketones, ur: NEGATIVE mg/dL
Leukocytes, UA: NEGATIVE
Nitrite: NEGATIVE
Protein, ur: NEGATIVE mg/dL
Specific Gravity, Urine: 1.031 — ABNORMAL HIGH (ref 1.005–1.030)
Urobilinogen, UA: 0.2 mg/dL (ref 0.0–1.0)
pH: 5 (ref 5.0–8.0)

## 2014-11-18 LAB — APTT: aPTT: 28 seconds (ref 24–37)

## 2014-11-18 LAB — ABO/RH: ABO/RH(D): O POS

## 2014-11-18 MED ORDER — POVIDONE-IODINE 7.5 % EX SOLN
Freq: Once | CUTANEOUS | Status: DC
  Filled 2014-11-18: qty 118

## 2014-11-18 MED ORDER — VANCOMYCIN HCL 10 G IV SOLR
1500.0000 mg | INTRAVENOUS | Status: AC
  Administered 2014-11-19: 1500 mg via INTRAVENOUS
  Filled 2014-11-18: qty 1500

## 2014-11-18 NOTE — Pre-Procedure Instructions (Signed)
[name]  11/18/2014   Your procedure is scheduled on: Wednesday, November 25.  Report to [facility] Admitting at 10:00 AM.  Call this number if you have problems the morning of surgery: [phone]   Remember:   Do not eat food or drink liquids after midnight.   Take these medicines the morning of surgery with A SIP OF WATER: atenolol (TENORMIN).               Take if needed:traMADol [name]).                Stop taking Vitamins and glucosamine-chondroitin.    Do not wear jewelry, make-up or nail polish.  Do not wear lotions, powders, or perfumes  Men may shave face and neck.  Do not bring valuables to the           .  [facility] is not responsible  for any belongings or valuables.               Contacts, dentures or bridgework may not be worn into surgery.  Leave suitcase in the car. After surgery it may be brought to your room.  For patients admitted to the hospital, discharge time is determined by your  treatment team.               Patients discharged the day of surgery will not be allowed to drive home.  Name and phone number of your driver:    Special Instructions: Review  [place] - Preparing For Surgery.   Please read over the following fact sheets that you were given: Pain Booklet, Coughing and Deep Breathing, Blood Transfusion Information and Surgical Site Infection Prevention

## 2014-11-19 ENCOUNTER — Inpatient Hospital Stay (HOSPITAL_COMMUNITY): Payer: Worker's Compensation

## 2014-11-19 ENCOUNTER — Inpatient Hospital Stay (HOSPITAL_COMMUNITY): Payer: Worker's Compensation | Admitting: Anesthesiology

## 2014-11-19 ENCOUNTER — Inpatient Hospital Stay (HOSPITAL_COMMUNITY)
Admission: RE | Admit: 2014-11-19 | Discharge: 2014-11-21 | DRG: 460 | Disposition: A | Discharge status: Home Health | Payer: Worker's Compensation | Source: Ambulatory Visit | Attending: Orthopedic Surgery | Admitting: Orthopedic Surgery

## 2014-11-19 ENCOUNTER — Encounter (HOSPITAL_COMMUNITY)
Admission: RE | Disposition: A | Discharge status: Home Health | Payer: 59 | Source: Ambulatory Visit | Attending: Orthopedic Surgery

## 2014-11-19 ENCOUNTER — Encounter (HOSPITAL_COMMUNITY): Payer: Self-pay | Admitting: *Deleted

## 2014-11-19 DIAGNOSIS — M79606 Pain in leg, unspecified: Secondary | ICD-10-CM | POA: Diagnosis present

## 2014-11-19 DIAGNOSIS — Z01818 Encounter for other preprocedural examination: Secondary | ICD-10-CM

## 2014-11-19 DIAGNOSIS — F17210 Nicotine dependence, cigarettes, uncomplicated: Secondary | ICD-10-CM | POA: Diagnosis present

## 2014-11-19 DIAGNOSIS — I480 Paroxysmal atrial fibrillation: Secondary | ICD-10-CM | POA: Diagnosis present

## 2014-11-19 DIAGNOSIS — M4316 Spondylolisthesis, lumbar region: Secondary | ICD-10-CM | POA: Diagnosis present

## 2014-11-19 DIAGNOSIS — Z881 Allergy status to other antibiotic agents status: Secondary | ICD-10-CM

## 2014-11-19 DIAGNOSIS — Z885 Allergy status to narcotic agent status: Secondary | ICD-10-CM | POA: Diagnosis not present

## 2014-11-19 DIAGNOSIS — M4800 Spinal stenosis, site unspecified: Secondary | ICD-10-CM | POA: Diagnosis present

## 2014-11-19 DIAGNOSIS — Z419 Encounter for procedure for purposes other than remedying health state, unspecified: Secondary | ICD-10-CM

## 2014-11-19 DIAGNOSIS — M4806 Spinal stenosis, lumbar region: Secondary | ICD-10-CM | POA: Diagnosis present

## 2014-11-19 DIAGNOSIS — Z880 Allergy status to penicillin: Secondary | ICD-10-CM | POA: Diagnosis not present

## 2014-11-19 LAB — TYPE AND SCREEN
ABO/RH(D): O POS
Antibody Screen: NEGATIVE

## 2014-11-19 SURGERY — POSTERIOR LUMBAR FUSION 1 LEVEL
Anesthesia: General | Site: Back

## 2014-11-19 MED ORDER — PROPOFOL 10 MG/ML IV BOLUS
INTRAVENOUS | Status: AC
  Filled 2014-11-19: qty 20

## 2014-11-19 MED ORDER — FENTANYL CITRATE 0.05 MG/ML IJ SOLN
INTRAMUSCULAR | Status: AC
  Filled 2014-11-19: qty 2

## 2014-11-19 MED ORDER — VECURONIUM BROMIDE 10 MG IV SOLR
INTRAVENOUS | Status: AC
  Filled 2014-11-19: qty 10

## 2014-11-19 MED ORDER — DOCUSATE SODIUM 100 MG PO CAPS
100.0000 mg | ORAL_CAPSULE | Freq: Two times a day (BID) | ORAL | Status: DC
  Administered 2014-11-20 – 2014-11-21 (×3): 100 mg via ORAL
  Filled 2014-11-19 (×4): qty 1

## 2014-11-19 MED ORDER — BUPIVACAINE-EPINEPHRINE 0.25% -1:200000 IJ SOLN
INTRAMUSCULAR | Status: DC | PRN
  Administered 2014-11-19: 24 mL

## 2014-11-19 MED ORDER — DIPHENHYDRAMINE HCL 25 MG PO CAPS: 25.0000 mg | ORAL_CAPSULE | Freq: Four times a day (QID) | ORAL | Status: DC | PRN

## 2014-11-19 MED ORDER — SENNOSIDES-DOCUSATE SODIUM 8.6-50 MG PO TABS: 1.0000 | ORAL_TABLET | Freq: Every evening | ORAL | Status: DC | PRN

## 2014-11-19 MED ORDER — ONDANSETRON HCL 4 MG/2ML IJ SOLN
4.0000 mg | Freq: Four times a day (QID) | INTRAMUSCULAR | Status: DC | PRN
  Filled 2014-11-19: qty 2

## 2014-11-19 MED ORDER — THROMBIN 20000 UNITS EX SOLR
CUTANEOUS | Status: AC
  Filled 2014-11-19: qty 20000

## 2014-11-19 MED ORDER — GLYCOPYRROLATE 0.2 MG/ML IJ SOLN
INTRAMUSCULAR | Status: AC
  Filled 2014-11-19: qty 3

## 2014-11-19 MED ORDER — HYDROMORPHONE HCL 1 MG/ML IJ SOLN
INTRAMUSCULAR | Status: AC
  Filled 2014-11-19: qty 1

## 2014-11-19 MED ORDER — FENTANYL CITRATE 0.05 MG/ML IJ SOLN
100.0000 ug | Freq: Once | INTRAMUSCULAR | Status: AC
  Administered 2014-11-19: 100 ug via INTRAVENOUS

## 2014-11-19 MED ORDER — FENTANYL CITRATE 0.05 MG/ML IJ SOLN
INTRAMUSCULAR | Status: DC | PRN
  Administered 2014-11-19 (×7): 50 ug via INTRAVENOUS

## 2014-11-19 MED ORDER — ZOLPIDEM TARTRATE 5 MG PO TABS
5.0000 mg | ORAL_TABLET | Freq: Every evening | ORAL | Status: DC | PRN
  Administered 2014-11-20 (×2): 5 mg via ORAL
  Filled 2014-11-19 (×2): qty 1

## 2014-11-19 MED ORDER — HYDROMORPHONE 0.3 MG/ML IV SOLN
INTRAVENOUS | Status: AC
  Filled 2014-11-19: qty 25

## 2014-11-19 MED ORDER — ALUM & MAG HYDROXIDE-SIMETH 200-200-20 MG/5ML PO SUSP: 30.0000 mL | Freq: Four times a day (QID) | ORAL | Status: DC | PRN

## 2014-11-19 MED ORDER — SODIUM CHLORIDE 0.9 % IV SOLN
INTRAVENOUS | Status: DC
  Administered 2014-11-19: 23:00:00 via INTRAVENOUS

## 2014-11-19 MED ORDER — PHENOL 1.4 % MT LIQD: 1.0000 | OROMUCOSAL | Status: DC | PRN

## 2014-11-19 MED ORDER — METHYLENE BLUE 1 % INJ SOLN
INTRAMUSCULAR | Status: DC | PRN
  Administered 2014-11-19: .5 mL via SUBMUCOSAL

## 2014-11-19 MED ORDER — PROPOFOL 10 MG/ML IV BOLUS
INTRAVENOUS | Status: DC | PRN
  Administered 2014-11-19: 150 mg via INTRAVENOUS
  Administered 2014-11-19: 50 mg via INTRAVENOUS

## 2014-11-19 MED ORDER — MENTHOL 3 MG MT LOZG: 1.0000 | LOZENGE | OROMUCOSAL | Status: DC | PRN

## 2014-11-19 MED ORDER — LACTATED RINGERS IV SOLN
INTRAVENOUS | Status: DC
  Administered 2014-11-19: 09:00:00 via INTRAVENOUS

## 2014-11-19 MED ORDER — ALBUMIN HUMAN 5 % IV SOLN
INTRAVENOUS | Status: DC | PRN
  Administered 2014-11-19: 16:00:00 via INTRAVENOUS

## 2014-11-19 MED ORDER — ARTIFICIAL TEARS OP OINT
TOPICAL_OINTMENT | OPHTHALMIC | Status: DC | PRN
  Administered 2014-11-19: 1 via OPHTHALMIC

## 2014-11-19 MED ORDER — SODIUM CHLORIDE 0.9 % IV SOLN: 250.0000 mL | INTRAVENOUS | Status: DC

## 2014-11-19 MED ORDER — METHYLENE BLUE 1 % INJ SOLN
INTRAMUSCULAR | Status: AC
  Filled 2014-11-19: qty 10

## 2014-11-19 MED ORDER — SODIUM CHLORIDE 0.9 % IJ SOLN: 3.0000 mL | INTRAMUSCULAR | Status: DC | PRN

## 2014-11-19 MED ORDER — HYDROMORPHONE HCL 1 MG/ML IJ SOLN
0.5000 mg | INTRAMUSCULAR | Status: DC | PRN
  Administered 2014-11-20: 1 mg via INTRAVENOUS
  Filled 2014-11-19: qty 1

## 2014-11-19 MED ORDER — LIDOCAINE HCL (CARDIAC) 20 MG/ML IV SOLN
INTRAVENOUS | Status: DC | PRN
  Administered 2014-11-19: 100 mg via INTRAVENOUS

## 2014-11-19 MED ORDER — MUPIROCIN 2 % EX OINT
1.0000 "application " | TOPICAL_OINTMENT | Freq: Once | CUTANEOUS | Status: AC
  Administered 2014-11-19: 1 via TOPICAL

## 2014-11-19 MED ORDER — ROCURONIUM BROMIDE 100 MG/10ML IV SOLN
INTRAVENOUS | Status: DC | PRN
  Administered 2014-11-19: 15 mg via INTRAVENOUS
  Administered 2014-11-19: 35 mg via INTRAVENOUS
  Administered 2014-11-19: 10 mg via INTRAVENOUS
  Administered 2014-11-19: 20 mg via INTRAVENOUS
  Administered 2014-11-19 (×2): 10 mg via INTRAVENOUS

## 2014-11-19 MED ORDER — ROCURONIUM BROMIDE 50 MG/5ML IV SOLN
INTRAVENOUS | Status: AC
  Filled 2014-11-19: qty 2

## 2014-11-19 MED ORDER — SODIUM CHLORIDE 0.9 % IV SOLN
INTRAVENOUS | Status: DC | PRN
  Administered 2014-11-19: 12:00:00 via INTRAVENOUS

## 2014-11-19 MED ORDER — DOCUSATE SODIUM 100 MG PO CAPS: 100.0000 mg | ORAL_CAPSULE | Freq: Every day | ORAL | Status: DC

## 2014-11-19 MED ORDER — VANCOMYCIN HCL IN DEXTROSE 1-5 GM/200ML-% IV SOLN
1000.0000 mg | Freq: Two times a day (BID) | INTRAVENOUS | Status: DC
  Administered 2014-11-19 – 2014-11-21 (×4): 1000 mg via INTRAVENOUS
  Filled 2014-11-19 (×4): qty 200

## 2014-11-19 MED ORDER — ARTIFICIAL TEARS OP OINT
TOPICAL_OINTMENT | OPHTHALMIC | Status: AC
  Filled 2014-11-19: qty 3.5

## 2014-11-19 MED ORDER — THROMBIN 20000 UNITS EX SOLR
CUTANEOUS | Status: DC | PRN
  Administered 2014-11-19 (×2): 20 mL via TOPICAL

## 2014-11-19 MED ORDER — PHENYLEPHRINE HCL 10 MG/ML IJ SOLN
INTRAMUSCULAR | Status: DC | PRN
  Administered 2014-11-19: 40 ug via INTRAVENOUS
  Administered 2014-11-19 (×3): 80 ug via INTRAVENOUS
  Administered 2014-11-19: 120 ug via INTRAVENOUS

## 2014-11-19 MED ORDER — DIPHENHYDRAMINE HCL 50 MG/ML IJ SOLN
12.5000 mg | Freq: Four times a day (QID) | INTRAMUSCULAR | Status: DC | PRN
  Filled 2014-11-19: qty 0.25

## 2014-11-19 MED ORDER — BUPIVACAINE-EPINEPHRINE (PF) 0.25% -1:200000 IJ SOLN
INTRAMUSCULAR | Status: AC
  Filled 2014-11-19: qty 30

## 2014-11-19 MED ORDER — DIPHENHYDRAMINE HCL 12.5 MG/5ML PO ELIX
12.5000 mg | ORAL_SOLUTION | Freq: Four times a day (QID) | ORAL | Status: DC | PRN
  Filled 2014-11-19: qty 5

## 2014-11-19 MED ORDER — LACTATED RINGERS IV SOLN
INTRAVENOUS | Status: DC | PRN
  Administered 2014-11-19 (×3): via INTRAVENOUS

## 2014-11-19 MED ORDER — ACETAMINOPHEN 325 MG PO TABS
650.0000 mg | ORAL_TABLET | ORAL | Status: DC | PRN
  Administered 2014-11-20: 650 mg via ORAL
  Filled 2014-11-19: qty 2

## 2014-11-19 MED ORDER — SUCCINYLCHOLINE CHLORIDE 20 MG/ML IJ SOLN
INTRAMUSCULAR | Status: AC
  Filled 2014-11-19: qty 1

## 2014-11-19 MED ORDER — THROMBIN 20000 UNITS EX SOLR
CUTANEOUS | Status: AC
  Filled 2014-11-19: qty 40000

## 2014-11-19 MED ORDER — FLEET ENEMA 7-19 GM/118ML RE ENEM: 1.0000 | ENEMA | Freq: Once | RECTAL | Status: AC | PRN

## 2014-11-19 MED ORDER — 0.9 % SODIUM CHLORIDE (POUR BTL) OPTIME
TOPICAL | Status: DC | PRN
  Administered 2014-11-19: 4000 mL

## 2014-11-19 MED ORDER — SODIUM CHLORIDE 0.9 % IJ SOLN
INTRAMUSCULAR | Status: AC
  Filled 2014-11-19: qty 10

## 2014-11-19 MED ORDER — PROPOFOL INFUSION 10 MG/ML OPTIME
INTRAVENOUS | Status: DC | PRN
  Administered 2014-11-19: 75 ug/kg/min via INTRAVENOUS

## 2014-11-19 MED ORDER — ONDANSETRON HCL 4 MG/2ML IJ SOLN: 4.0000 mg | INTRAMUSCULAR | Status: DC | PRN

## 2014-11-19 MED ORDER — SODIUM CHLORIDE 0.9 % IJ SOLN: 3.0000 mL | Freq: Two times a day (BID) | INTRAMUSCULAR | Status: DC

## 2014-11-19 MED ORDER — MIDAZOLAM HCL 2 MG/2ML IJ SOLN
INTRAMUSCULAR | Status: AC
  Administered 2014-11-19: 1 mg
  Filled 2014-11-19: qty 2

## 2014-11-19 MED ORDER — NEOSTIGMINE METHYLSULFATE 10 MG/10ML IV SOLN
INTRAVENOUS | Status: AC
  Filled 2014-11-19: qty 1

## 2014-11-19 MED ORDER — MUPIROCIN 2 % EX OINT
TOPICAL_OINTMENT | CUTANEOUS | Status: AC
  Filled 2014-11-19: qty 22

## 2014-11-19 MED ORDER — HYDROMORPHONE 0.3 MG/ML IV SOLN
INTRAVENOUS | Status: DC
  Administered 2014-11-19 (×2): via INTRAVENOUS
  Administered 2014-11-19: 4.99 mg via INTRAVENOUS
  Administered 2014-11-20: 08:00:00 via INTRAVENOUS
  Administered 2014-11-20: 4.99 mg via INTRAVENOUS
  Filled 2014-11-19 (×2): qty 25

## 2014-11-19 MED ORDER — MIDAZOLAM HCL 2 MG/2ML IJ SOLN
INTRAMUSCULAR | Status: AC
  Filled 2014-11-19: qty 2

## 2014-11-19 MED ORDER — VECURONIUM BROMIDE 10 MG IV SOLR
INTRAVENOUS | Status: DC | PRN
  Administered 2014-11-19: 2 mg via INTRAVENOUS

## 2014-11-19 MED ORDER — DIAZEPAM 5 MG PO TABS
5.0000 mg | ORAL_TABLET | Freq: Four times a day (QID) | ORAL | Status: DC | PRN
Start: 2014-11-19 — End: 2014-11-21
  Administered 2014-11-19 – 2014-11-21 (×5): 5 mg via ORAL
  Filled 2014-11-19 (×5): qty 1

## 2014-11-19 MED ORDER — METHYLPREDNISOLONE ACETATE 40 MG/ML IJ SUSP
INTRAMUSCULAR | Status: AC
  Filled 2014-11-19: qty 1

## 2014-11-19 MED ORDER — SODIUM CHLORIDE 0.9 % IJ SOLN: 9.0000 mL | INTRAMUSCULAR | Status: DC | PRN

## 2014-11-19 MED ORDER — MIDAZOLAM HCL 5 MG/5ML IJ SOLN
INTRAMUSCULAR | Status: DC | PRN
  Administered 2014-11-19: 2 mg via INTRAVENOUS

## 2014-11-19 MED ORDER — LIDOCAINE HCL (CARDIAC) 20 MG/ML IV SOLN
INTRAVENOUS | Status: AC
  Filled 2014-11-19: qty 5

## 2014-11-19 MED ORDER — FENTANYL CITRATE 0.05 MG/ML IJ SOLN
25.0000 ug | INTRAMUSCULAR | Status: DC | PRN
  Administered 2014-11-19 (×3): 50 ug via INTRAVENOUS

## 2014-11-19 MED ORDER — ONDANSETRON HCL 4 MG/2ML IJ SOLN
INTRAMUSCULAR | Status: DC | PRN
Start: 2014-11-19 — End: 2014-11-19
  Administered 2014-11-19: 4 mg via INTRAVENOUS

## 2014-11-19 MED ORDER — NEOSTIGMINE METHYLSULFATE 10 MG/10ML IV SOLN
INTRAVENOUS | Status: DC | PRN
Start: 2014-11-19 — End: 2014-11-19
  Administered 2014-11-19: 4 mg via INTRAVENOUS

## 2014-11-19 MED ORDER — DIAZEPAM 5 MG PO TABS
ORAL_TABLET | ORAL | Status: AC
  Filled 2014-11-19: qty 1

## 2014-11-19 MED ORDER — SUCCINYLCHOLINE CHLORIDE 20 MG/ML IJ SOLN
INTRAMUSCULAR | Status: DC | PRN
  Administered 2014-11-19: 120 mg via INTRAVENOUS

## 2014-11-19 MED ORDER — STERILE WATER FOR INJECTION IJ SOLN
INTRAMUSCULAR | Status: AC
  Filled 2014-11-19: qty 10

## 2014-11-19 MED ORDER — HYDROMORPHONE HCL 1 MG/ML IJ SOLN
0.5000 mg | INTRAMUSCULAR | Status: AC | PRN
  Administered 2014-11-19 (×4): 0.5 mg via INTRAVENOUS

## 2014-11-19 MED ORDER — EPHEDRINE SULFATE 50 MG/ML IJ SOLN
INTRAMUSCULAR | Status: AC
  Filled 2014-11-19: qty 1

## 2014-11-19 MED ORDER — PHENYLEPHRINE 40 MCG/ML (10ML) SYRINGE FOR IV PUSH (FOR BLOOD PRESSURE SUPPORT)
PREFILLED_SYRINGE | INTRAVENOUS | Status: AC
  Filled 2014-11-19: qty 20

## 2014-11-19 MED ORDER — FENTANYL CITRATE 0.05 MG/ML IJ SOLN
INTRAMUSCULAR | Status: AC
  Filled 2014-11-19: qty 5

## 2014-11-19 MED ORDER — ATENOLOL 50 MG PO TABS
50.0000 mg | ORAL_TABLET | Freq: Every day | ORAL | Status: DC
  Administered 2014-11-20 – 2014-11-21 (×2): 50 mg via ORAL
  Filled 2014-11-19 (×2): qty 1

## 2014-11-19 MED ORDER — HYDROMORPHONE HCL 2 MG PO TABS
1.0000 mg | ORAL_TABLET | ORAL | Status: DC | PRN
  Administered 2014-11-20 (×3): 2 mg via ORAL
  Filled 2014-11-19 (×2): qty 1

## 2014-11-19 MED ORDER — NALOXONE HCL 0.4 MG/ML IJ SOLN
0.4000 mg | INTRAMUSCULAR | Status: DC | PRN
  Filled 2014-11-19: qty 1

## 2014-11-19 MED ORDER — EPHEDRINE SULFATE 50 MG/ML IJ SOLN
INTRAMUSCULAR | Status: DC | PRN
  Administered 2014-11-19 (×2): 10 mg via INTRAVENOUS

## 2014-11-19 MED ORDER — GLUCOSAMINE HCL 1500 MG PO TABS: 1500.0000 mg | ORAL_TABLET | Freq: Every day | ORAL | Status: DC

## 2014-11-19 MED ORDER — MIDAZOLAM HCL 2 MG/2ML IJ SOLN: 1.0000 mg | Freq: Once | INTRAMUSCULAR | Status: AC

## 2014-11-19 MED ORDER — GLYCOPYRROLATE 0.2 MG/ML IJ SOLN
INTRAMUSCULAR | Status: DC | PRN
  Administered 2014-11-19: 0.6 mg via INTRAVENOUS

## 2014-11-19 MED ORDER — ZINC GLUCONATE 50 MG PO TABS: 50.0000 mg | ORAL_TABLET | Freq: Every day | ORAL | Status: DC

## 2014-11-19 MED ORDER — BISACODYL 5 MG PO TBEC: 5.0000 mg | DELAYED_RELEASE_TABLET | Freq: Every day | ORAL | Status: DC | PRN

## 2014-11-19 MED ORDER — ACETAMINOPHEN 650 MG RE SUPP: 650.0000 mg | RECTAL | Status: DC | PRN

## 2014-11-19 MED ORDER — ADULT MULTIVITAMIN W/MINERALS CH
1.0000 | ORAL_TABLET | Freq: Every day | ORAL | Status: DC
  Administered 2014-11-20 – 2014-11-21 (×2): 1 via ORAL
  Filled 2014-11-19 (×2): qty 1

## 2014-11-19 SURGICAL SUPPLY — 93 items
15FR BLAKE DRAIN ×2 IMPLANT
APL SKNCLS STERI-STRIP NONHPOA (GAUZE/BANDAGES/DRESSINGS) ×1
BENZOIN TINCTURE PRP APPL 2/3 (GAUZE/BANDAGES/DRESSINGS) ×2 IMPLANT
BLADE SURG ROTATE 9660 (MISCELLANEOUS) IMPLANT
BUR ROUND PRECISION 4.0 (BURR) ×2 IMPLANT
CARTRIDGE OIL MAESTRO DRILL (MISCELLANEOUS) ×1 IMPLANT
CONT SPEC 4OZ CLIKSEAL STRL BL (MISCELLANEOUS) ×1 IMPLANT
CONT SPEC STER OR (MISCELLANEOUS) ×2 IMPLANT
CORDS BIPOLAR (ELECTRODE) ×2 IMPLANT
COVER MAYO STAND STRL (DRAPES) ×3 IMPLANT
COVER SURGICAL LIGHT HANDLE (MISCELLANEOUS) ×2 IMPLANT
DIFFUSER DRILL AIR PNEUMATIC (MISCELLANEOUS) ×2 IMPLANT
DRAIN CHANNEL 15F RND FF W/TCR (WOUND CARE) IMPLANT
DRAPE C-ARM 42X72 X-RAY (DRAPES) ×2 IMPLANT
DRAPE ORTHO SPLIT 77X108 STRL (DRAPES) ×2
DRAPE POUCH INSTRU U-SHP 10X18 (DRAPES) ×2 IMPLANT
DRAPE SURG 17X23 STRL (DRAPES) ×6 IMPLANT
DRAPE SURG ORHT 6 SPLT 77X108 (DRAPES) ×1 IMPLANT
DURAPREP 26ML APPLICATOR (WOUND CARE) ×2 IMPLANT
ELECT BLADE 4.0 EZ CLEAN MEGAD (MISCELLANEOUS) ×2
ELECT CAUTERY BLADE 6.4 (BLADE) ×2 IMPLANT
ELECT REM PT RETURN 9FT ADLT (ELECTROSURGICAL) ×2
ELECTRODE BLDE 4.0 EZ CLN MEGD (MISCELLANEOUS) ×1 IMPLANT
ELECTRODE REM PT RTRN 9FT ADLT (ELECTROSURGICAL) ×1 IMPLANT
EVACUATOR SILICONE 100CC (DRAIN) ×1 IMPLANT
GAUZE SPONGE 4X4 12PLY STRL (GAUZE/BANDAGES/DRESSINGS) ×2 IMPLANT
GAUZE SPONGE 4X4 16PLY XRAY LF (GAUZE/BANDAGES/DRESSINGS) ×7 IMPLANT
GLOVE BIO SURGEON STRL SZ7 (GLOVE) ×4 IMPLANT
GLOVE BIO SURGEON STRL SZ8 (GLOVE) ×2 IMPLANT
GLOVE BIOGEL PI IND STRL 7.0 (GLOVE) ×1 IMPLANT
GLOVE BIOGEL PI IND STRL 8 (GLOVE) ×1 IMPLANT
GLOVE BIOGEL PI INDICATOR 7.0 (GLOVE) ×2
GLOVE BIOGEL PI INDICATOR 8 (GLOVE) ×1
GLOVE BIOGEL PI ORTHO PRO 7.5 (GLOVE) ×1
GLOVE PI ORTHO PRO STRL 7.5 (GLOVE) IMPLANT
GLOVE SS N UNI LF 7.0 STRL (GLOVE) ×4 IMPLANT
GOWN STRL REUS W/ TWL LRG LVL3 (GOWN DISPOSABLE) ×2 IMPLANT
GOWN STRL REUS W/ TWL XL LVL3 (GOWN DISPOSABLE) ×1 IMPLANT
GOWN STRL REUS W/TWL LRG LVL3 (GOWN DISPOSABLE) ×4
GOWN STRL REUS W/TWL XL LVL3 (GOWN DISPOSABLE) ×2
IV CATH 14GX2 1/4 (CATHETERS) ×2 IMPLANT
KIT BASIN OR (CUSTOM PROCEDURE TRAY) ×2 IMPLANT
KIT POSITION SURG JACKSON T1 (MISCELLANEOUS) ×2 IMPLANT
KIT ROOM TURNOVER OR (KITS) ×2 IMPLANT
MARKER SKIN DUAL TIP RULER LAB (MISCELLANEOUS) ×3 IMPLANT
NDL ASP BONE MRW 11GX15 (NEEDLE) IMPLANT
NDL HYPO 25GX1X1/2 BEV (NEEDLE) ×1 IMPLANT
NDL SAFETY ECLIPSE 18X1.5 (NEEDLE) ×1 IMPLANT
NDL SPNL 18GX3.5 QUINCKE PK (NEEDLE) ×2 IMPLANT
NEEDLE 22X1 1/2 (OR ONLY) (NEEDLE) ×2 IMPLANT
NEEDLE ASP BONE MRW 11GX15 (NEEDLE) ×2 IMPLANT
NEEDLE BONE MARROW 8GX6 FENEST (NEEDLE) IMPLANT
NEEDLE HYPO 18GX1.5 SHARP (NEEDLE) ×2
NEEDLE HYPO 25GX1X1/2 BEV (NEEDLE) ×2 IMPLANT
NEEDLE SPNL 18GX3.5 QUINCKE PK (NEEDLE) ×4 IMPLANT
NEURO MONITORING STIM (LABOR (TRAVEL & OVERTIME)) ×2 IMPLANT
NS IRRIG 1000ML POUR BTL (IV SOLUTION) ×4 IMPLANT
OIL CARTRIDGE MAESTRO DRILL (MISCELLANEOUS) ×2
PACK LAMINECTOMY ORTHO (CUSTOM PROCEDURE TRAY) ×2 IMPLANT
PACK UNIVERSAL I (CUSTOM PROCEDURE TRAY) ×2 IMPLANT
PACK VITOSS BIOACTIVE 10CC (Neuro Prosthesis/Implant) ×1 IMPLANT
PAD ARMBOARD 7.5X6 YLW CONV (MISCELLANEOUS) ×4 IMPLANT
PATTIES SURGICAL .5 X1 (DISPOSABLE) ×2 IMPLANT
PATTIES SURGICAL .5X1.5 (GAUZE/BANDAGES/DRESSINGS) ×2 IMPLANT
RHYTHMLINK 100MM LONG, 0.75MM DIAMETER, 3MM BALL TIP MONOPOLAR STIMULATING PROBE ×2 IMPLANT
ROD PRE BENT EXP 40MM (Rod) ×1 IMPLANT
ROD PRE BENT EXPEDIUM 35MM (Rod) ×2 IMPLANT
SCREW POLYAXIAL 7X45MM (Screw) ×4 IMPLANT
SCREW SET SINGLE INNER (Screw) ×8 IMPLANT
SPONGE INTESTINAL PEANUT (DISPOSABLE) ×3 IMPLANT
SPONGE LAP 4X18 X RAY DECT (DISPOSABLE) ×2 IMPLANT
SPONGE SURGIFOAM ABS GEL 100 (HEMOSTASIS) ×2 IMPLANT
STRIP CLOSURE SKIN 1/2X4 (GAUZE/BANDAGES/DRESSINGS) ×2 IMPLANT
SURGIFLO TRUKIT (HEMOSTASIS) IMPLANT
SUT BONE WAX W31G (SUTURE) ×1 IMPLANT
SUT ETHILON 2 0 FS 18 (SUTURE) ×1 IMPLANT
SUT MNCRL AB 3-0 PS2 18 (SUTURE) ×2 IMPLANT
SUT MNCRL AB 4-0 PS2 18 (SUTURE) ×3 IMPLANT
SUT VIC AB 0 CT1 18XCR BRD 8 (SUTURE) ×1 IMPLANT
SUT VIC AB 0 CT1 8-18 (SUTURE) ×2
SUT VIC AB 1 CT1 18XCR BRD 8 (SUTURE) ×2 IMPLANT
SUT VIC AB 1 CT1 8-18 (SUTURE) ×4
SUT VIC AB 2-0 CT2 18 VCP726D (SUTURE) ×3 IMPLANT
SYR 20CC LL (SYRINGE) ×2 IMPLANT
SYR BULB IRRIGATION 50ML (SYRINGE) ×2 IMPLANT
SYR CONTROL 10ML LL (SYRINGE) ×4 IMPLANT
SYR TB 1ML LUER SLIP (SYRINGE) ×2 IMPLANT
TAPE CLOTH SURG 4X10 WHT LF (GAUZE/BANDAGES/DRESSINGS) ×1 IMPLANT
TOWEL OR 17X24 6PK STRL BLUE (TOWEL DISPOSABLE) ×2 IMPLANT
TOWEL OR 17X26 10 PK STRL BLUE (TOWEL DISPOSABLE) ×2 IMPLANT
TRAY FOLEY CATH 16FRSI W/METER (SET/KITS/TRAYS/PACK) ×2 IMPLANT
WATER STERILE IRR 1000ML POUR (IV SOLUTION) ×2 IMPLANT
YANKAUER SUCT BULB TIP NO VENT (SUCTIONS) ×2 IMPLANT

## 2014-11-19 NOTE — Anesthesia Procedure Notes (Signed)
Procedure Name: Intubation Date/Time: 11/19/2014 12:09 PM Performed by: Donette Larry E Pre-anesthesia Checklist: Patient identified, Emergency Drugs available, Suction available, Patient being monitored and Timeout performed Patient Re-evaluated:Patient Re-evaluated prior to inductionOxygen Delivery Method: Circle system utilized Preoxygenation: Pre-oxygenation with 100% oxygen Intubation Type: IV induction Ventilation: Mask ventilation without difficulty and Two handed mask ventilation required Laryngoscope Size: Mac and 4 Grade View: Grade I Tube type: Oral Tube size: 8.0 mm Number of attempts: 1 Airway Equipment and Method: Stylet Secured at: 24 cm Tube secured with: Tape Dental Injury: Teeth and Oropharynx as per pre-operative assessment

## 2014-11-19 NOTE — Progress Notes (Signed)
ANTIBIOTIC CONSULT NOTE - INITIAL  Pharmacy Consult for vancomycin Indication: post op prophylaxis  Allergies  Allergen Reactions  . Erythromycin     ABDOMINAL CRAMPS  . Morphine And Related Rash  . Penicillins Rash  . Percocet [Oxycodone-Acetaminophen] Rash    Turned orange  . Z-Pak [Azithromycin]     Severe stomach cramps     Patient Measurements: Height: 5\' 9"  (175.3 cm) Weight: 251 lb (113.853 kg) IBW/kg (Calculated) : 70.7    Vital Signs: Temp: 98.2 F (36.8 C) (11/25 2045) Temp Source: Oral (11/25 2045) BP: 97/55 mmHg (11/25 2045) Pulse Rate: 75 (11/25 2045) Intake/Output from previous day:   Intake/Output from this shift: Total I/O In: -  Out: 1280 [Urine:1200; Drains:80]  Labs:  Recent Labs  11/18/14 1615  WBC 10.1  HGB 15.4  PLT 218  CREATININE 0.67   Estimated Creatinine Clearance: 131.4 mL/min (by C-G formula based on Cr of 0.67). No results for input(s): VANCOTROUGH, VANCOPEAK, VANCORANDOM, GENTTROUGH, GENTPEAK, GENTRANDOM, TOBRATROUGH, TOBRAPEAK, TOBRARND, AMIKACINPEAK, AMIKACINTROU, AMIKACIN in the last 72 hours.   Microbiology: Recent Results (from the past 720 hour(s))  Surgical pcr screen     Status: Abnormal   Collection Time: 11/18/14  4:34 PM  Result Value Ref Range Status   MRSA, PCR NEGATIVE NEGATIVE Final   Staphylococcus aureus POSITIVE (A) NEGATIVE Final    Comment:        The Xpert SA Assay (FDA approved for NASAL specimens in patients over 54 years of age), is one component of a comprehensive surveillance program.  Test performance has been validated by [facility] for patients greater than or equal to 552 year old. It is not intended to diagnose infection nor to guide or monitor treatment.     Medical History: Past Medical History  Diagnosis Date  . Arthritis   . Dysrhythmia     PAF  . Diverticular disease     Medications:  Prescriptions prior to admission  Medication Sig Dispense Refill Last Dose  .  atenolol (TENORMIN) 50 MG tablet Take 50 mg by mouth daily.   11/19/2014 at 0600  . diphenhydrAMINE (BENADRYL) 25 MG tablet Take 25 mg by mouth every 6 (six) hours as needed for allergies or sleep.   11/18/2014 at 2000  . docusate sodium (COLACE) 100 MG capsule Take 100 mg by mouth daily. Takes Equate Stool  [place]   11/18/2014 at 0600  . Glucosamine HCl 1500 MG TABS Take 1,500 mg by mouth daily.   11/18/2014 at 0600  . Multiple Vitamin (MULTIVITAMIN WITH MINERALS) TABS tablet Take 1 tablet by mouth daily.   Past Month at Unknown time  . zinc gluconate 50 MG tablet Take 50 mg by mouth daily.   11/18/2014 at 0600  . [DISCONTINUED] acetaminophen (TYLENOL) 650 MG CR tablet Take 1,300 mg by mouth every 8 (eight) hours as needed for pain. Tylenol arthritis   11/18/2014 at 2200  . [DISCONTINUED] traMADol (ULTRAM) 50 MG tablet Take 100 mg by mouth every 6 (six) hours as needed for moderate pain.   11/19/2014 at 0600  . [DISCONTINUED] acetaminophen (TYLENOL) 500 MG tablet Take 1,000 mg by mouth every 6 (six) hours as needed for mild pain.   Unknown at 2200   Assessment: 53 yo man s/p back surgery to continue vancomycin until drain is out.  He received vanc 1500 mg IV @ 12:13 today.  His CrCl >100.  Weight 113.9 kg  Goal of Therapy:  Vancomycin trough level 10-15 mcg/ml  Plan:  Cont vanc 1 gm IV q12 hours as per obesity nomogram until drain is out. F/u renal function, cultures and clinical course  Thanks for allowing pharmacy to be a part of this patient's care.  Talbert Cage, PharmD Clinical Pharmacist, [phone] 11/19/2014,9:32 PM

## 2014-11-19 NOTE — Transfer of Care (Signed)
Immediate Anesthesia Transfer of Care Note  Patient: [name]  Procedure(s) Performed: Procedure(s) with comments: POSTERIOR LUMBAR FUSION 1 LEVEL (N/A) - Lumbar 4-5 decompression and fusion with instrumentation and bone marrow aspirate  Patient Location: PACU  Anesthesia Type:General  Level of Consciousness: awake  Airway & Oxygen Therapy: Patient Spontanous Breathing and Patient connected to face mask oxygen  Post-op Assessment: Report given to PACU RN and Post -op Vital signs reviewed and stable  Post vital signs: Reviewed and stable  Complications: No apparent anesthesia complications

## 2014-11-19 NOTE — H&P (Signed)
PREOPERATIVE H&P  Chief Complaint: Bilateral leg pain  HPI: [name] is a 53 y.o. male who presents with ongoing pain in both legs  MRI reveals stenosis at L4/5 and radiographs reveal instability at L4/5  Patient has failed multiple forms of conservative care and continues to have pain (see office notes for additional details regarding the patient's full course of treatment)  Past Medical History  Diagnosis Date  . Arthritis   . Dysrhythmia     PAF  . Diverticular disease    Past Surgical History  Procedure Laterality Date  . Neck fusion  5/11  . Knee arthroscopy Left 5/10    x2  . Colon surgery  12/07, 5/08, 10/09  . Replantation thumb Right   . Tonsillectomy    . Wisdom tooth extraction    . Colonoscopy    . Colostomy  2007  . Colostomy reversal    . Incisional hernia repair  2009    with mesh   History   Social History  . Marital Status: Married    Spouse Name: N/A    Number of Children: N/A  . Years of Education: N/A   Social History Main Topics  . Smoking status: Current Some Day Smoker -- 2 years    Types: Cigarettes  . Smokeless tobacco: Never Used     Comment: Smokes some days, maybe 1 pack a week  . Alcohol Use: 8.4 oz/week    14 Cans of beer per week     Comment: 2 beers/ day  . Drug Use: No  . Sexual Activity: Not on file   Other Topics Concern  . Not on file   Social History Narrative   No family history on file. Allergies  Allergen Reactions  . Erythromycin     ABDOMINAL CRAMPS  . Morphine And Related Rash  . Penicillins Rash  . Percocet [Oxycodone-Acetaminophen] Rash    Turned orange  . Z-Pak [Azithromycin]     Severe stomach cramps    Prior to Admission medications   Medication Sig Start Date End Date Taking? Authorizing Provider  acetaminophen (TYLENOL) 500 MG tablet Take 1,000 mg by mouth every 6 (six) hours as needed for mild pain.   Yes Historical Provider, MD  atenolol (TENORMIN) 50 MG tablet Take 50 mg by  mouth daily.   Yes Historical Provider, MD  diphenhydrAMINE (BENADRYL) 25 MG tablet Take 25 mg by mouth every 6 (six) hours as needed for allergies or sleep.   Yes Historical Provider, MD  Multiple Vitamin (MULTIVITAMIN WITH MINERALS) TABS tablet Take 1 tablet by mouth daily.   Yes Historical Provider, MD  traMADol (ULTRAM) 50 MG tablet Take 100 mg by mouth every 6 (six) hours as needed for moderate pain.   Yes Historical Provider, MD  acetaminophen (TYLENOL) 650 MG CR tablet Take 1,300 mg by mouth every 8 (eight) hours as needed for pain. Tylenol arthritis    Historical Provider, MD  docusate sodium (COLACE) 100 MG capsule Take 100 mg by mouth daily. Takes Equate Stool  [place]    Historical Provider, MD  Glucosamine HCl 1500 MG TABS Take 1,500 mg by mouth daily.    Historical Provider, MD  zinc gluconate 50 MG tablet Take 50 mg by mouth daily.    Historical Provider, MD     All other systems have been reviewed and were otherwise negative with the exception of those mentioned in the HPI and as above.  Physical Exam: There were no  vitals filed for this visit.  General: Alert, no acute distress Cardiovascular: No pedal edema Respiratory: No cyanosis, no use of accessory musculature Skin: No lesions in the area of chief complaint Neurologic: Sensation intact distally Psychiatric: Patient is competent for consent with normal mood and affect Lymphatic: No axillary or cervical lymphadenopathy   Assessment/Plan: Low back pain with bilateral leg pain Plan for Procedure(s): POSTERIOR LUMBAR DECOMPRESSION AND FUSION 1 LEVEL   Emilee HeroUMONSKI,Kendle Turbin LEONARD, MD 11/19/2014 8:08 AM

## 2014-11-19 NOTE — Progress Notes (Signed)
PHARMACIST - PHYSICIAN ORDER COMMUNICATION  CONCERNING: P&T Medication Policy on Herbal Medications  DESCRIPTION:  This patient's order for:  Glucosamine and Zinc gluconate  have been noted.  These products are classified as an "herbal" or natural product. Due to a lack of definitive safety studies or FDA approval, nonstandard manufacturing practices, plus the potential risk of unknown drug-drug interactions while on inpatient medications, the Pharmacy and Therapeutics Committee does not permit the use of "herbal" or natural products of this type within [facility].   ACTION TAKEN: The pharmacy department is unable to verify this order at this time. Please reevaluate patient's clinical condition at discharge and address if the herbal or natural product(s) should be resumed at that time.   Nicolette Bang, RPh Pager [phone]) 11/19/2014 9:25 PM

## 2014-11-19 NOTE — Anesthesia Preprocedure Evaluation (Signed)
Anesthesia Evaluation  Patient identified by MRN, date of birth, ID band Patient awake    Reviewed: Allergy & Precautions, H&P , NPO status   Airway Mallampati: II  TM Distance: >3 FB     Dental   Pulmonary neg pulmonary ROS, Current Smoker,  breath sounds clear to auscultation        Cardiovascular negative cardio ROS  + dysrhythmias Rhythm:Regular Rate:Normal  Cardiac history noted. CE   Neuro/Psych    GI/Hepatic negative GI ROS, Neg liver ROS,   Endo/Other  negative endocrine ROS  Renal/GU negative Renal ROS     Musculoskeletal  (+) Arthritis -,   Abdominal   Peds  Hematology   Anesthesia Other Findings   Reproductive/Obstetrics                             Anesthesia Physical Anesthesia Plan  ASA: II  Anesthesia Plan: General   Post-op Pain Management:    Induction: Intravenous  Airway Management Planned: Oral ETT  Additional Equipment:   Intra-op Plan:   Post-operative Plan: Extubation in OR  Informed Consent: I have reviewed the patients History and Physical, chart, labs and discussed the procedure including the risks, benefits and alternatives for the proposed anesthesia with the patient or authorized representative who has indicated his/her understanding and acceptance.     Plan Discussed with: CRNA and Anesthesiologist  Anesthesia Plan Comments:         Anesthesia Quick Evaluation

## 2014-11-19 NOTE — Anesthesia Postprocedure Evaluation (Signed)
  Anesthesia Post-op Note  Patient: [name]  Procedure(s) Performed: Procedure(s) with comments: POSTERIOR LUMBAR FUSION 1 LEVEL (N/A) - Lumbar 4-5 decompression and fusion with instrumentation and bone marrow aspirate  Patient Location: PACU  Anesthesia Type:General  Level of Consciousness: awake  Airway and Oxygen Therapy: Patient Spontanous Breathing  Post-op Pain: mild  Post-op Assessment: Post-op Vital signs reviewed  Post-op Vital Signs: Reviewed  Last Vitals:  Filed Vitals:   11/19/14 1033  BP:   Pulse: 68  Temp:   Resp: 20    Complications: No apparent anesthesia complications

## 2014-11-20 MED ORDER — HYDROMORPHONE HCL 1 MG/ML IJ SOLN
1.0000 mg | INTRAMUSCULAR | Status: DC | PRN
  Administered 2014-11-20 – 2014-11-21 (×8): 2 mg via INTRAVENOUS
  Filled 2014-11-20 (×7): qty 2

## 2014-11-20 MED ORDER — OXYCODONE HCL ER 20 MG PO T12A
20.0000 mg | EXTENDED_RELEASE_TABLET | Freq: Two times a day (BID) | ORAL | Status: DC
Start: 2014-11-20 — End: 2014-11-21
  Administered 2014-11-20 – 2014-11-21 (×3): 20 mg via ORAL
  Filled 2014-11-20 (×3): qty 1

## 2014-11-20 MED ORDER — HYDROCODONE-ACETAMINOPHEN 7.5-325 MG PO TABS
1.0000 | ORAL_TABLET | ORAL | Status: DC
  Administered 2014-11-20: 1 via ORAL
  Administered 2014-11-20 – 2014-11-21 (×4): 2 via ORAL
  Filled 2014-11-20 (×5): qty 2

## 2014-11-20 NOTE — Evaluation (Signed)
Occupational Therapy Evaluation and Discharge Patient Details Name: [name] MRN: [id number] DOB: 1961-12-20 Today's Date: 11/20/2014    History of Present Illness Posterior spinal fusion, L4-L5.   Clinical Impression   This 53 yo male admitted and underwent above presents to acute OT with all education completed and no further questions/concerns about BADLs. Acute OT will sign off.    Follow Up Recommendations  No OT follow up    Equipment Recommendations  3 in 1 bedside comode       Precautions / Restrictions Precautions Precautions: Back Precaution Booklet Issued: Yes (comment) Precaution Comments: Educated but needed cues to state back precautions.  Required Braces or Orthoses: Spinal Brace Spinal Brace: Applied in sitting position;Applied in standing position (per orders it says "on at all times except for night trips to the bathroom". Per pt Dr. Yevette Edwards told him, " he could have it off if he was sitting and could have it off if he was just getting up to go to the kitchen to get a drink and going to sit right) Restrictions Weight Bearing Restrictions: No      Mobility Bed Mobility               General bed mobility comments: Pt up in recliner upon my arrival  Transfers Overall transfer level: Needs assistance Equipment used: Rolling walker (2 wheeled) Transfers: Sit to/from Stand Sit to Stand: Supervision         General transfer comment: Cues for hand placement and for technique.           ADL Overall ADL's : Needs assistance/impaired                                       General ADL Comments: Pt up to the bathroom to urinate with S of his wife. Educated pt on the use of baby wipes for back peri care for ease of cleaning and trying to avoid twisting. Pt reports he use a reacher before to help him with his LBD and plans on continuing to use it or his wife will help him. We talked about him using his 3n1 in their walk in shower  facing the door and him just backing up to sit on it or using his RW in the shower stall with him. I went over with him the most efficient way of getting dressed.               Pertinent Vitals/Pain Pain Assessment: 0-10 Pain Score: 7  Pain Location: back Pain Descriptors / Indicators: Aching;Pressure Pain Intervention(s): Limited activity within patient's tolerance;Monitored during session;Premedicated before session;Repositioned     Hand Dominance Right   Extremity/Trunk Assessment Upper Extremity Assessment Upper Extremity Assessment: Overall WFL for tasks assessed     Communication Communication Communication: No difficulties   Cognition Arousal/Alertness: Awake/alert Behavior During Therapy: WFL for tasks assessed/performed Overall Cognitive Status: Within Functional Limits for tasks assessed                                Home Living Family/patient expects to be discharged to:: Private residence Living Arrangements: Spouse/significant other Available Help at Discharge: Family;Available 24 hours/day Type of Home: House Home Access: Stairs to enter [employer] of Steps: 2 Entrance Stairs-Rails: Right Home Layout: One level     Bathroom Shower/Tub: Walk-in shower;Door  Bathroom Toilet: Standard     Home Equipment:  (getting a BSC)          Prior Functioning/Environment Level of Independence: Independent             OT Diagnosis: Generalized weakness         OT Goals(Current goals can be found in the care plan section) Acute Rehab OT Goals Patient Stated Goal: home maybe tomorrow  OT Frequency:                End of Session Equipment Utilized During Treatment: Rolling walker  Activity Tolerance: Patient tolerated treatment well Patient left: in chair;with call bell/phone within reach;with family/visitor present   Time: [id number] OT Time Calculation (min): 24 min Charges:  OT General Charges $OT Visit: 1  Procedure OT Evaluation $Initial OT Evaluation Tier I: 1 Procedure OT Treatments $Self Care/Home Management : 8-22 mins  Evette GeorgesLeonard, Loneta Tamplin Eva [phone] 11/20/2014, 1:49 PM

## 2014-11-20 NOTE — Progress Notes (Addendum)
Subjective: L 4/5 decomp & instrumented fusion Sitting up, tol PO, leg pain much improved, back pain difficult to control--reports 8-9/10 through the night   Objective: Vital signs in last 24 hours: Temp:  [96.2 F (35.7 C)-98.5 F (36.9 C)] 98.5 F (36.9 C) (11/26 0500) Pulse Rate:  [68-102] 84 (11/26 0500) Resp:  [7-30] 17 (11/26 0800) BP: (97-124)/(52-77) 104/59 mmHg (11/26 0500) SpO2:  [93 %-100 %] 98 % (11/26 0800)  Intake/Output from previous day: 11/25 0701 - 11/26 0700 In: 4100 [I.V.:3650; Blood:200; IV Piggyback:250] Out: 3985 [Urine:3150; Drains:235; Blood:600] Intake/Output this shift: Total I/O In: 240 [P.O.:240] Out: -    Recent Labs  11/18/14 1615  HGB 15.4    Recent Labs  11/18/14 1615  WBC 10.1  RBC 5.04  HCT 45.9  PLT 218    Recent Labs  11/18/14 1615  NA 140  K 4.2  CL 102  CO2 23  BUN 17  CREATININE 0.67  GLUCOSE 89  CALCIUM 9.5    Recent Labs  11/18/14 1615  INR 0.95   Lumbar Drain 235 overnight Dressing clean/dry/intact externally Strong DF/PF ankle/toes including hallux, intact LT sens P/D/1st Web  Assessment/Plan: Patient reports rash allergy to Percocet, but has tolerated Vicodin. Will add Norco as baseline, with PRN dilaudid for breakthru D/C PCA PT D/C once pain controlled and passes PT   Scorpio Fortin A. 11/20/2014, 10:14 AM

## 2014-11-20 NOTE — Progress Notes (Signed)
PT evaluation  11/20/14 1300  PT Visit Information  Last PT Received On 11/20/14  Assistance Needed +1  History of Present Illness Pt admit for PLIF L4-5.  Precautions  Precautions Back  Precaution Booklet Issued Yes (comment)  Precaution Comments Educated but needed cues to state back precautions.   Required Braces or Orthoses Spinal Brace  Spinal Brace Applied in sitting position;TLSO  Restrictions  Weight Bearing Restrictions No  Home Living  Family/patient expects to be discharged to: Private residence  Living Arrangements Spouse/significant other;Children  Available Help at Discharge Family;Available 24 hours/day  Type of Home House  Home Access Stairs to enter  Entrance Stairs-Number of Steps 2  Entrance Stairs-Rails Right  Home Layout One level  Home Equipment Other (comment);Cane - single point (3 wheeled walker)  Prior Function  Level of Independence Independent  Communication  Communication No difficulties  Pain Assessment  Pain Assessment 0-10  Pain Score 7  Pain Location back  Pain Descriptors / Indicators Aching;Pressure  Pain Intervention(s) Limited activity within patient's tolerance;Monitored during session;Premedicated before session;Repositioned  Cognition  Arousal/Alertness Awake/alert  Behavior During Therapy WFL for tasks assessed/performed  Overall Cognitive Status Within Functional Limits for tasks assessed  Upper Extremity Assessment  Upper Extremity Assessment Defer to OT evaluation  Lower Extremity Assessment  Lower Extremity Assessment Generalized weakness  Cervical / Trunk Assessment  Cervical / Trunk Assessment Normal  Bed Mobility  General bed mobility comments On EOB on arrival without brace.  Pt informed he should have brace on when sitting.  Pt informed this PT that MD told him that he could wear if as he felt like it when at home but he had to wear it when he went out.  Reinforced to pt why this PT recommends to wear brace once sitting  and pt verbalized understanding.  Reviewed log roll with pt and gave handout.  Asked pt at end of session if he wanted to get in bed and review bed mobility more and pt stated he would do so tomorrow.  Brace applied with pt watching.  Pt did not want brace tightened alot but do feel it was sufficient.  Wife came in once PT got brace on.  We did discuss how brace was applied.   Transfers  Overall transfer level Needs assistance  Equipment used Rolling walker (2 wheeled)  Transfers Sit to/from Stand  Sit to Stand Min guard  General transfer comment Cues for hand placement and for technique.    Ambulation/Gait  Ambulation/Gait assistance Min guard  Ambulation Distance (Feet) 350 Feet  Assistive device Rolling walker (2 wheeled)  Gait Pattern/deviations Step-through pattern;Decreased stride length;Wide base of support  Gait velocity interpretation Below normal speed for age/gender  General Gait Details Pt was able to ambulate with RW with good technique.  Once in chair, pt positioned with pillows.  Pt complained that brace not comfortable under arms.  Pt took brace off and declined putting it back on even though he understands why PT suggests to keep it on.    Balance  Overall balance assessment Needs assistance;History of Falls  Sitting-balance support Feet supported;No upper extremity supported  Sitting balance-Leahy Scale Good  Standing balance support Bilateral upper extremity supported;During functional activity  Standing balance-Leahy Scale Poor  Standing balance comment Requires UE support in standing.   General Comments  General comments (skin integrity, edema, etc.) Pt declined steps today.   PT - End of Session  Equipment Utilized During Treatment Gait belt;Back brace  Activity Tolerance Patient  limited by fatigue;Patient limited by pain  Patient left in chair;with call bell/phone within reach;with family/visitor present  Nurse Communication Mobility status;Patient requests pain meds   PT Assessment  PT Therapy Diagnosis  Generalized weakness;Acute pain  PT Recommendation/Assessment Patient needs continued PT services  PT Problem List Decreased balance;Decreased mobility;Decreased knowledge of use of DME;Decreased safety awareness;Decreased knowledge of precautions;Decreased strength;Decreased activity tolerance;Pain  PT Plan  PT Frequency (ACUTE ONLY) Min 5X/week  PT Treatment/Interventions (ACUTE ONLY) DME instruction;Gait training;Functional mobility training;Therapeutic activities;Therapeutic exercise;Balance training;Patient/family education  PT Recommendation  Follow Up Recommendations Home health PT;Supervision/Assistance - 24 hour  PT equipment 3in1 (PT)  Individuals Consulted  Consulted and Agree with Results and Recommendations Patient;Family member/caregiver  Family Member Consulted wife  Acute Rehab PT Goals  Patient Stated Goal to go home  PT Goal Formulation With patient  Time For Goal Achievement 11/27/14  Potential to Achieve Goals Good  PT Time Calculation  PT Start Time (ACUTE ONLY) 1143  PT Stop Time (ACUTE ONLY) 1217  PT Time Calculation (min) (ACUTE ONLY) 34 min  PT General Charges  $$ ACUTE PT VISIT 1 Procedure  PT Evaluation  $Initial PT Evaluation Tier I 1 Procedure  PT Treatments  $Gait Training 8-22 mins  $Self Care/Home Management 8-22  [facility] HospitalDawn Ulyssa Walthour,PT Acute Rehabilitation [phone] (pager)

## 2014-11-20 NOTE — Progress Notes (Signed)
Verbal order received today from Dr. Karsten Ro for Oxycontin 20mg  BID. Pharmacy reports patient has allergy to percocet that has nearly identical ingredients as Oxycontin. Left message for on call to clarify order.

## 2014-11-20 NOTE — Op Note (Signed)
NAMRowe Pavy:  Halliwell, Damoni              ACCOUNT NO.:  [id number]  MEDICAL RECORD NO.:  [id number]  LOCATION:  [id number]                        FACILITY:  [facility]  PHYSICIAN:  Estill BambergMark Nilah Belcourt, MD      DATE OF BIRTH:  05-22-1961  DATE OF PROCEDURE:  11/19/2014                              OPERATIVE REPORT   PREOPERATIVE DIAGNOSES: 1. L4-L5 spinal stenosis. 2. L4-L5 spondylolisthesis.  POSTOPERATIVE DIAGNOSES: 1. L4-L5 spinal stenosis. 2. L4-L5 spondylolisthesis.  PROCEDURES: 1. L4-L5 laminectomy with bilateral partial facetectomy and bilateral     foraminotomy. 2. Posterior spinal fusion, L4-L5. 3. Placement of posterior nonsegmental instrumentation, L4-L5 (7 x 45     mm screws x4). 4. Use of local autograft. 5. Intraoperative use of fluoroscopy. 6. Intraoperative bone marrow aspiration from the patient's right     iliac crest using a separate incision.  SURGEON:  Estill BambergMark Lyrick Lagrand, MD.  ASSISTANJason Coop:  Kayla McKenzie, PA-C.  ANESTHESIA:  General endotracheal anesthesia.  COMPLICATIONS:  None.  DISPOSITION:  Stable.  ESTIMATED BLOOD LOSS:  200 mL.  INDICATIONS FOR SURGERY:  Briefly, Mr. Johna Sheriffickle is a pleasant 53 year old male who was injured at work on August 04, 2014.  He states after his injury, he did go on to complain of pain in his back and bilateral legs. His leg pain did become severe.  He did continue to have ongoing leg pain despite appropriate conservative care.  Of note, an MRI of the patient's lumbar spine was reviewed and was notable for a substantial L4- L5 spinal stenosis.  I did feel that this was readily correlating to the patient's pain.  Again, the patient did fail multiple forms of conservative care, and we did discuss proceeding with the surgery noted above.  The patient did elect to proceed.  OPERATIVE DETAILS:  On November 19, 2014, the patient was brought to Surgery and general endotracheal anesthesia was administered.  The patient was placed prone on a  well-padded flat Jackson bed with a spinal frame.  Antibiotics were given and a time-out was performed.  The back was prepped and draped.  A midline incision was then made.  The fascia was incised in the midline, and the paraspinal musculature was retracted.  The lamina of L4 and L5 were subperiosteally exposed.  A lateral intraoperative radiograph did confirm the appropriate operative levels.  Using anatomic landmarks, I did cannulate the L4 and L5 pedicles bilaterally.  I did also use intraoperative fluoroscopy.  The posterolateral gutters were then packed using Ray-Tec.  I then turned my attention towards the decompression.  The L4 spinous process was removed.  I then went forward with a central and bilateral lateral recess decompression.  I did meticulously remove substantial and significant facet hypertrophy encroaching on the spinal canal bilaterally.  I did also confirm a bilateral neural foraminal decompression.  I was very pleased with the decompression that I was able to accomplish.  The epidural space was then packed with Gelfoam. The wound was then copiously irrigated with approximately 2 L of normal saline.  I then used a high-speed bur to decorticate the transverse processes of L4 and L5 in addition to the lamina of L4 and L5 bilaterally.  A 7 x  45 mm screws were placed, 2 in each vertebral body at L4 and L5.  Rods were placed bilaterally and caps were then also placed and a final locking procedure was performed.  Of note, prior to placing the hardware, I did harvested 8 mL of bone marrow aspirate from the patient's right iliac crest using a separate incision.  This was mixed with 10 mL of Vitoss BA.  The Vitoss mixture was then mixed with the autograft obtained from the decompression.  A 50% of the bone graft mixture was packed into the left posterolateral gutter, and 50% was placed on the right.  This was all done prior to placing the hardware. I did also use triggered EMG  to test each of the pedicle screws.  There was no screw that tested below 20 milliamps.  I was very pleased with the AP and lateral fluoroscopic images.  The Gelfoam was then removed from the epidural space and there was no undue bleeding encountered.  A #15 deep Blake drain was then placed.  I then closed the fascia using #1 Vicryl.  The subcutaneous layer was closed using 2-0 Vicryl followed by 3-0 Monocryl.  Benzoin and Steri-Strips were applied followed by a sterile dressing.  All instrument counts were correct at the termination of the procedure.  Of note, Jason Coop was my assistant throughout surgery and did aid in retraction, suctioning, and closure.     Estill Bamberg, MD     MD/MEDQ  D:  11/19/2014  T:  11/20/2014  Job:  [id number]

## 2014-11-21 MED ORDER — OXYCODONE HCL ER 20 MG PO T12A: 20.0000 mg | EXTENDED_RELEASE_TABLET | Freq: Two times a day (BID) | ORAL | Status: DC

## 2014-11-21 NOTE — Progress Notes (Signed)
Patient is a workers comp case and has orders for [facility] and for a rolling walker and a 3-in-1 BSC.  Workers [employer] are not open today so they would not give authorization for any home/discharge needs. I spoke with assigned RN who feels patient is safe at home without the [facility] until Monday when office reopens and his assigned adjuster can make the necessary arrangements. Pt already has a walker at home but preferred the ordered rolling one instead. RN feels he can be safe with the walker he already has.  Advised the RN to print the orders for the [facility] and the DME as those are what we would fax to [facility] company if they were open.  She is going to give them to the patient along with his discharge instructions so that he can give them to the adjuster on Monday for the necessary arrangements.   Carlyle Lipa, RN BSN M[facility] CCM  Case Manager, Trauma Service/Unit 16M [phone]

## 2014-11-21 NOTE — Discharge Instructions (Signed)
See handout on chart

## 2014-11-21 NOTE — Care Management Note (Signed)
CARE MANAGEMENT NOTE 11/21/2014  Patient:  [name], [name]   Account Number:  [id number]  Date Initiated:  11/21/2014  Documentation initiated by:  Vance Peper  Subjective/Objective Assessment:   53 yr old male admitted with Low back pain with bilateral leg pain. Patient had a L4-L5 decompression..     Action/Plan:   Case manager spoke with patient concerning home health and DME needs. Patient is under worker's comp. CM called Darci Current, CM with [employer], left message. Worker's comp is closed today, CM will fax info on Monday.   Anticipated DC Date:  11/21/2014   Anticipated DC Plan:  HOME W HOME HEALTH SERVICES      DC Planning Services  CM consult      PAC Choice  DURABLE MEDICAL EQUIPMENT  HOME HEALTH   Choice offered to / List presented to:     DME arranged  3-N-1      DME agency  OTHER - SEE NOTE     HH arranged  HH-2 PT      HH agency  OTHER - SEE NOTE   Status of service:  In process, will continue to follow Medicare Important Message given?   (If response is "NO", the following Medicare IM given date fields will be blank) Date Medicare IM given:   Medicare IM given by:   Date Additional Medicare IM given:   Additional Medicare IM given by:    Discharge Disposition:  HOME W HOME HEALTH SERVICES  Per UR Regulation:  Reviewed for med. necessity/level of care/duration of stay  If discussed at Long Length of Stay Meetings, dates discussed:    Comments:  11/21/14  Vance Peper, RN BSN Case Manager Patient's worker's comp Case Manager: Darci Current [phone]. Company is [employer] [phone].

## 2014-11-21 NOTE — Progress Notes (Signed)
Subjective: L 4/5 decomp & instrumented fusion Sitting up, tol PO, leg pain much improved, back pain difficult to control--reports pain much improved on current regimen Ready to be D/C   Objective: Vital signs in last 24 hours: Temp:  [98.8 F (37.1 C)-99.7 F (37.6 C)] 99.7 F (37.6 C) (11/27 0502) Pulse Rate:  [92-114] 112 (11/27 0921) Resp:  [18] 18 (11/27 0921) BP: (103-123)/(57-68) 123/59 mmHg (11/27 0921) SpO2:  [93 %-99 %] 99 % (11/27 0921)  Intake/Output from previous day: 11/26 0701 - 11/27 0700 In: 440 [P.O.:240; IV Piggyback:200] Out: 60 [Drains:60] Intake/Output this shift: Total I/O In: 360 [P.O.:360] Out: -    Recent Labs  11/18/14 1615  HGB 15.4    Recent Labs  11/18/14 1615  WBC 10.1  RBC 5.04  HCT 45.9  PLT 218    Recent Labs  11/18/14 1615  NA 140  K 4.2  CL 102  CO2 23  BUN 17  CREATININE 0.67  GLUCOSE 89  CALCIUM 9.5    Recent Labs  11/18/14 1615  INR 0.95   Lumbar Drain pulled uneventfully Dressing clean/dry/intact externally Strong DF/PF ankle/toes including hallux, intact LT sens P/D/1st Web  Assessment/Plan: DC home today--oxycontin, dilaudid, valium  Bryker Fletchall A. 11/21/2014, 10:22 AM

## 2014-11-21 NOTE — Progress Notes (Signed)
Physical Therapy Treatment Patient Details Name: LENNEX ROHM MRN: [id number] DOB: 06/07/1961 Today's Date: 11/21/2014    History of Present Illness Posterior spinal fusion, L4-L5.    PT Comments    Pt. With good progress today.  Says he has a borrowed RW in the home but needs 3 n 1.  RN Case manager Vance Peper has attempted to contact Workers Comp but they are closed today.  I tried pt. without a RW for a shorter distance and he did well, then informed me he has a borrowed RW in the home.  He says she does not have steps.  Education completed.. Anticipate DC later today.  Follow Up Recommendations  Home health PT;Supervision/Assistance - 24 hour     Equipment Recommendations  3in1 (PT)    Recommendations for Other Services       Precautions / Restrictions Precautions Precautions: Back Precaution Comments: Pt. able to state 3/3 back precautions and was able to generalize to functional mobility  Required Braces or Orthoses: Spinal Brace Spinal Brace: Applied in sitting position;Applied in standing position Restrictions Weight Bearing Restrictions: No    Mobility  Bed Mobility Overal bed mobility:  (not tested; pt. in bedside chair)                Transfers Overall transfer level: Needs assistance Equipment used: Rolling walker (2 wheeled) Transfers: Sit to/from Stand Sit to Stand: Modified independent (Device/Increase time)         General transfer comment: good technique with no reminders needed for hand placement this am  Ambulation/Gait Ambulation/Gait assistance: Modified independent (Device/Increase time) Ambulation Distance (Feet): 650 Feet (500' with RW  then 150' with no device) Assistive device: Rolling walker (2 wheeled) Gait Pattern/deviations: Step-through pattern;Decreased stride length     General Gait Details: Pt. ambulated with RW aoround unit, then 150 without device.  No additional supprot needed without device though back with  earlier fatigue.  Advised pt. to use RW until cleared ffor ambulation without device by PT or MD   Stairs            Wheelchair Mobility    Modified Rankin (Stroke Patients Only)       Balance                                    Cognition Arousal/Alertness: Awake/alert Behavior During Therapy: WFL for tasks assessed/performed Overall Cognitive Status: Within Functional Limits for tasks assessed                      Exercises      General Comments        Pertinent Vitals/Pain Pain Assessment: 0-10 Pain Score: 4  Pain Location: lumbar spinal area Pain Descriptors / Indicators: Aching;Pressure Pain Intervention(s): Premedicated before session;Monitored during session;Repositioned    Home Living                      Prior Function            PT Goals (current goals can now be found in the care plan section) Progress towards PT goals: Progressing toward goals    Frequency  Min 5X/week    PT Plan Current plan remains appropriate    Co-evaluation             End of Session Equipment Utilized During Treatment: Gait belt;Back brace Activity Tolerance: Patient tolerated treatment  well Patient left: in chair;with call bell/phone within reach     Time: 1040-1103 PT Time Calculation (min) (ACUTE ONLY): 23 min  Charges:  $Gait Training: 23-37 mins                    G Codes:      Ferman HammingBlankenship, Earnest Mcgillis B 11/21/2014, 11:15 AM

## 2014-11-21 NOTE — Plan of Care (Signed)
Problem: Consults Goal: Spinal Surgery Patient Education See Patient Education Module for education specifics. Outcome: Completed/Met Date Met:  11/21/14 Goal: Diagnosis - Spinal Surgery Outcome: Completed/Met Date Met:  11/21/14 Thoraco/Lumbar Spine Fusion L4-L5  Problem: Phase I Progression Outcomes Goal: OOB as tolerated unless otherwise ordered Outcome: Completed/Met Date Met:  11/21/14 Goal: Log roll for position change Outcome: Completed/Met Date Met:  11/21/14 Goal: Initial discharge plan identified Outcome: Completed/Met Date Met:  11/21/14 Goal: Hemodynamically stable Outcome: Completed/Met Date Met:  11/21/14

## 2014-11-21 NOTE — Plan of Care (Signed)
Problem: Phase I Progression Outcomes Goal: Pain controlled with appropriate interventions Outcome: Completed/Met Date Met:  11/21/14 Goal: PT/OT consults requested Outcome: Completed/Met Date Met:  11/21/14 Goal: Other Phase I Outcomes/Goals Outcome: Completed/Met Date Met:  11/21/14  Problem: Phase II Progression Outcomes Goal: Progress activity as tolerated unless otherwise ordered Outcome: Completed/Met Date Met:  11/21/14 Goal: Discharge plan established Outcome: Completed/Met Date Met:  11/21/14 Goal: Tolerating diet Outcome: Completed/Met Date Met:  11/21/14 Goal: Verbalizes of donning/doffing brace Outcome: Completed/Met Date Met:  11/21/14 Goal: Understands assist devices with ambulation Outcome: Completed/Met Date Met:  11/21/14 Goal: PT/OT consults completed Outcome: Completed/Met Date Met:  11/21/14 Goal: Other Phase II Outcomes/Goals Outcome: Completed/Met Date Met:  11/21/14  Problem: Phase III Progression Outcomes Goal: Pain controlled on oral analgesia Outcome: Completed/Met Date Met:  11/21/14 Goal: Activity at appropriate level-compared to baseline (UP IN CHAIR FOR HEMODIALYSIS)  Outcome: Completed/Met Date Met:  11/21/14 Goal: Demonstrates donning/doffing brace Outcome: Completed/Met Date Met:  11/21/14 Goal: Demonstrates proper use of assistive devices Outcome: Completed/Met Date Met:  11/21/14 Goal: Discharge plan remains appropriate-arrangements made Outcome: Completed/Met Date Met:  11/21/14 Goal: Other Phase III Outcomes/Goals Outcome: Completed/Met Date Met:  11/21/14  Problem: Discharge Progression Outcomes Goal: Barriers To Progression Addressed/Resolved Outcome: Completed/Met Date Met:  11/21/14 Goal: Discharge plan in place and appropriate Outcome: Completed/Met Date Met:  11/21/14 Goal: Pain controlled with appropriate interventions Outcome: Completed/Met Date Met:  11/21/14 Goal: Hemodynamically stable Outcome: Completed/Met Date Met:   42/37/02 Goal: Complications resolved/controlled Outcome: Completed/Met Date Met:  11/21/14 Goal: Tolerates diet Outcome: Completed/Met Date Met:  11/21/14 Goal: Incision without S/S infection Outcome: Completed/Met Date Met:  11/21/14 Goal: Ambulates without assistance Outcome: Completed/Met Date Met:  11/21/14 Goal: Demonstrates proper use of assistive devices Outcome: Completed/Met Date Met:  11/21/14 Goal: Other Discharge Outcomes/Goals Outcome: Completed/Met Date Met:  11/21/14

## 2014-11-21 NOTE — Discharge Summary (Signed)
Physician Discharge Summary  Patient ID: [name] Demedeiros MRN: [id number] DOB/AGE: February 05, 1961 53 y.o.  Admit date: 11/19/2014 Discharge date: 11/21/2014  Admission Diagnoses:  Spinal stenosis  Discharge Diagnoses:  Active Problems:   Spinal stenosis   Past Medical History  Diagnosis Date  . Arthritis   . Dysrhythmia     PAF  . Diverticular disease     Surgeries: Procedure(s): POSTERIOR LUMBAR FUSION 1 LEVEL on 11/19/2014   Consultants (if any):    Discharged Condition: Improved  Hospital Course: [name] Elias is an 53 y.o. male who was admitted 11/19/2014 with a diagnosis of <principal problem not specified> and went to the operating room on 11/19/2014 and underwent the above named procedures.    He was given perioperative antibiotics:  Anti-infectives    Start     Dose/Rate Route Frequency Ordered Stop   11/20/14 0000  vancomycin (VANCOCIN) IVPB 1000 mg/200 mL premix     1,000 mg200 mL/hr over 60 Minutes Intravenous Every 12 hours 11/19/14 2132     11/19/14 0600  vancomycin (VANCOCIN) 1,500 mg in sodium chloride 0.9 % 500 mL IVPB     1,500 mg250 mL/hr over 120 Minutes Intravenous On call to O.R. 11/18/14 1335 11/19/14 1213    .  He was given sequential compression devices, early ambulation  for DVT prophylaxis.  He benefited maximally from the hospital stay and there were no complications.    Recent vital signs:  Filed Vitals:   11/21/14 0921  BP: 123/59  Pulse: 112  Temp:   Resp: 18    Recent laboratory studies:  Lab Results  Component Value Date   HGB 15.4 11/18/2014   HGB 14.8 04/14/2010   HGB 17.3* 11/25/2008   Lab Results  Component Value Date   WBC 10.1 11/18/2014   PLT 218 11/18/2014   Lab Results  Component Value Date   INR 0.95 11/18/2014   Lab Results  Component Value Date   NA 140 11/18/2014   K 4.2 11/18/2014   CL 102 11/18/2014   CO2 23 11/18/2014   BUN 17 11/18/2014   CREATININE 0.67 11/18/2014   GLUCOSE 89 11/18/2014     Discharge Medications:     Medication List    TAKE these medications        atenolol 50 MG tablet  Commonly known as:  TENORMIN  Take 50 mg by mouth daily.     diphenhydrAMINE 25 MG tablet  Commonly known as:  BENADRYL  Take 25 mg by mouth every 6 (six) hours as needed for allergies or sleep.     docusate sodium 100 MG capsule  Commonly known as:  COLACE  Take 100 mg by mouth daily. Takes Equate Stool  [place]     Glucosamine HCl 1500 MG Tabs  Take 1,500 mg by mouth daily.     multivitamin with minerals Tabs tablet  Take 1 tablet by mouth daily.     OxyCODONE 20 mg T12a 12 hr tablet  Commonly known as:  OXYCONTIN  Take 1 tablet (20 mg total) by mouth every 12 (twelve) hours.     zinc gluconate 50 MG tablet  Take 50 mg by mouth daily.        Diagnostic Studies: Dg Chest 2 View  11/18/2014   CLINICAL DATA:  Preoperative evaluation for a lower lumbar fusion, history of atrial fibrillation, smoker  EXAM: CHEST - 2 VIEW  COMPARISON:  04/14/2010  FINDINGS: Stable levoscoliosis of the thoracic spine. Normal heart size and  vascularity. Chronic fissural scarring/ atelectasis in the right midlung. No superimposed pneumonia, collapse or consolidation. Negative for edema, effusion, or pneumothorax. Lower cervical fusion hardware noted. Trachea is midline.  IMPRESSION: Stable exam. Chronic right midlung scarring. No superimposed acute process.   Electronically Signed   By: Ruel Favors M.D.   On: 11/18/2014 16:45   Dg Lumbar Spine 2-3 Views  11/19/2014   CLINICAL DATA:  Status post fusion at L4 and L5  EXAM: DG C-ARM 61-120 MIN; LUMBAR SPINE - 2-3 VIEW  COMPARISON:  None  FINDINGS: Frontal and lateral views were obtained. There is posterior screw and plate fixation at L4 and L5. Screw tips are in the respective vertebral bodies. There is a sponge in the soft tissues well posterior to the hardware. There is no lumbar fracture or spondylolisthesis.  IMPRESSION: Status post posterior  screw and plate fixation at L4 and L5. Sponge in the soft tissues well posterior to the spine. No fracture or spondylolisthesis apparent.  Critical Value/emergent results were called by telephone at the time of interpretation on 11/19/2014 at 5:37 pm to Gerlene Fee, RN, who verbally acknowledged these results.   Electronically Signed   By: Bretta Bang M.D.   On: 11/19/2014 17:38   Dg Lumbar Spine 2-3 Views  11/19/2014   CLINICAL DATA:  Posterior spinal fusion  EXAM: LUMBAR SPINE - 2-3 VIEW  COMPARISON:  None.  FINDINGS: Two cross-table lateral images obtained. On the first image, there is a metallic probe with the tip posterior to the inferior aspect of the L3 vertebral body. A second probe is posterior to the mid L5 vertebral body. On the second submitted image, there is a metallic probe tip posterior to the L4-5 interspace. There is no fracture. There is minimal anterolisthesis of L4 on L5. No other spondylolisthesis. There is moderate disc space narrowing at all levels.  IMPRESSION: Metallic probe tip is posterior to L4-5 on the second submitted cross-table lateral image. Disc space narrowing is noted at multiple levels. There is minimal anterolisthesis of L4 on L5. No fracture.   Electronically Signed   By: Bretta Bang M.D.   On: 11/19/2014 14:20   Dg C-arm 61-120 Min  11/19/2014   CLINICAL DATA:  Status post fusion at L4 and L5  EXAM: DG C-ARM 61-120 MIN; LUMBAR SPINE - 2-3 VIEW  COMPARISON:  None  FINDINGS: Frontal and lateral views were obtained. There is posterior screw and plate fixation at L4 and L5. Screw tips are in the respective vertebral bodies. There is a sponge in the soft tissues well posterior to the hardware. There is no lumbar fracture or spondylolisthesis.  IMPRESSION: Status post posterior screw and plate fixation at L4 and L5. Sponge in the soft tissues well posterior to the spine. No fracture or spondylolisthesis apparent.  Critical Value/emergent results were called  by telephone at the time of interpretation on 11/19/2014 at 5:37 pm to Gerlene Fee, RN, who verbally acknowledged these results.   Electronically Signed   By: Bretta Bang M.D.   On: 11/19/2014 17:38    Disposition: 01-Home or Self Care        Follow-up Information    Follow up with Emilee Hero, MD.   Specialty:  Orthopedic Surgery   Why:  as instructed   Contact information:   [address] [place] [address] [phone]        Signed: Janee Morn, Tereka Thorley A. 11/21/2014, 10:26 AM

## 2014-11-24 MED FILL — Sodium Chloride IV Soln 0.9%: INTRAVENOUS | Qty: 1000 | Status: AC

## 2014-11-24 MED FILL — Sodium Chloride Irrigation Soln 0.9%: Qty: 3000 | Status: AC

## 2014-11-24 MED FILL — Heparin Sodium (Porcine) Inj 1000 Unit/ML: INTRAMUSCULAR | Qty: 30 | Status: AC

## 2014-11-24 NOTE — Care Management Note (Signed)
11/24/14 2:45pm Vance Peper, RN BSN Case Manager CM received call from patient's field case manager- Darci Current , provided  His fax number for Home Health and DME orders to be faxed to: [phone].

## 2014-12-23 ENCOUNTER — Encounter (HOSPITAL_COMMUNITY): Payer: Self-pay | Admitting: Orthopedic Surgery

## 2015-02-12 ENCOUNTER — Other Ambulatory Visit: Payer: Self-pay | Admitting: Orthopedic Surgery

## 2015-02-12 DIAGNOSIS — G8929 Other chronic pain: Secondary | ICD-10-CM

## 2015-02-12 DIAGNOSIS — M545 Low back pain, unspecified: Secondary | ICD-10-CM

## 2015-02-20 ENCOUNTER — Ambulatory Visit
Admission: RE | Admit: 2015-02-20 | Discharge: 2015-02-20 | Disposition: A | Discharge status: Home / Self Care | Payer: Worker's Compensation | Source: Ambulatory Visit | Attending: Orthopedic Surgery | Admitting: Orthopedic Surgery

## 2015-02-20 DIAGNOSIS — M545 Low back pain, unspecified: Secondary | ICD-10-CM

## 2015-02-20 DIAGNOSIS — G8929 Other chronic pain: Secondary | ICD-10-CM

## 2015-02-20 DIAGNOSIS — M4800 Spinal stenosis, site unspecified: Secondary | ICD-10-CM

## 2015-02-20 MED ORDER — IOHEXOL 180 MG/ML  SOLN
17.0000 mL | Freq: Once | INTRAMUSCULAR | Status: AC | PRN
  Administered 2015-02-20: 17 mL via INTRATHECAL

## 2015-02-20 MED ORDER — MEPERIDINE HCL 100 MG/ML IJ SOLN
100.0000 mg | Freq: Once | INTRAMUSCULAR | Status: AC
  Administered 2015-02-20: 100 mg via INTRAMUSCULAR

## 2015-02-20 MED ORDER — ONDANSETRON HCL 4 MG/2ML IJ SOLN
4.0000 mg | Freq: Once | INTRAMUSCULAR | Status: AC
  Administered 2015-02-20: 4 mg via INTRAMUSCULAR

## 2015-02-20 MED ORDER — DIAZEPAM 5 MG PO TABS
10.0000 mg | ORAL_TABLET | Freq: Once | ORAL | Status: AC
  Administered 2015-02-20: 10 mg via ORAL

## 2015-02-20 NOTE — Discharge Instructions (Signed)
Myelogram Discharge Instructions  1. Go home and rest quietly for the next 24 hours.  It is important to lie flat for the next 24 hours.  Get up only to go to the restroom.  You may lie in the bed or on a couch on your back, your stomach, your left side or your right side.  You may have one pillow under your head.  You may have pillows between your knees while you are on your side or under your knees while you are on your back.  2. DO NOT drive today.  Recline the seat as far back as it will go, while still wearing your seat belt, on the way home.  3. You may get up to go to the bathroom as needed.  You may sit up for 10 minutes to eat.  You may resume your normal diet and medications unless otherwise indicated.  Drink plenty of extra fluids today and tomorrow.  4. The incidence of a spinal headache with nausea and/or vomiting is about 5% (one in 20 patients).  If you develop a headache, lie flat and drink plenty of fluids until the headache goes away.  Caffeinated beverages may be helpful.  If you develop severe nausea and vomiting or a headache that does not go away with flat bed rest, call [phone].  5. You may resume normal activities after your 24 hours of bed rest is over; however, do not exert yourself strongly or do any heavy lifting tomorrow.  6. Call your physician for a follow-up appointment.   You may resume Tramadol on Saturday, February 21, 2015 after 8:30a.m.

## 2015-02-20 NOTE — Progress Notes (Signed)
Patient states he has been off Tramadol for at least the past two days.  Natalio Salois, RN 

## 2015-04-26 IMAGING — CR DG CHEST 2V
2 series · 2 of 2 positions shown · non-contrast
Comparison: 04/14/2010

CLINICAL DATA: Preoperative evaluation for a lower lumbar fusion,
history of atrial fibrillation, smoker

EXAM:
CHEST - 2 VIEW

[w chest pa]
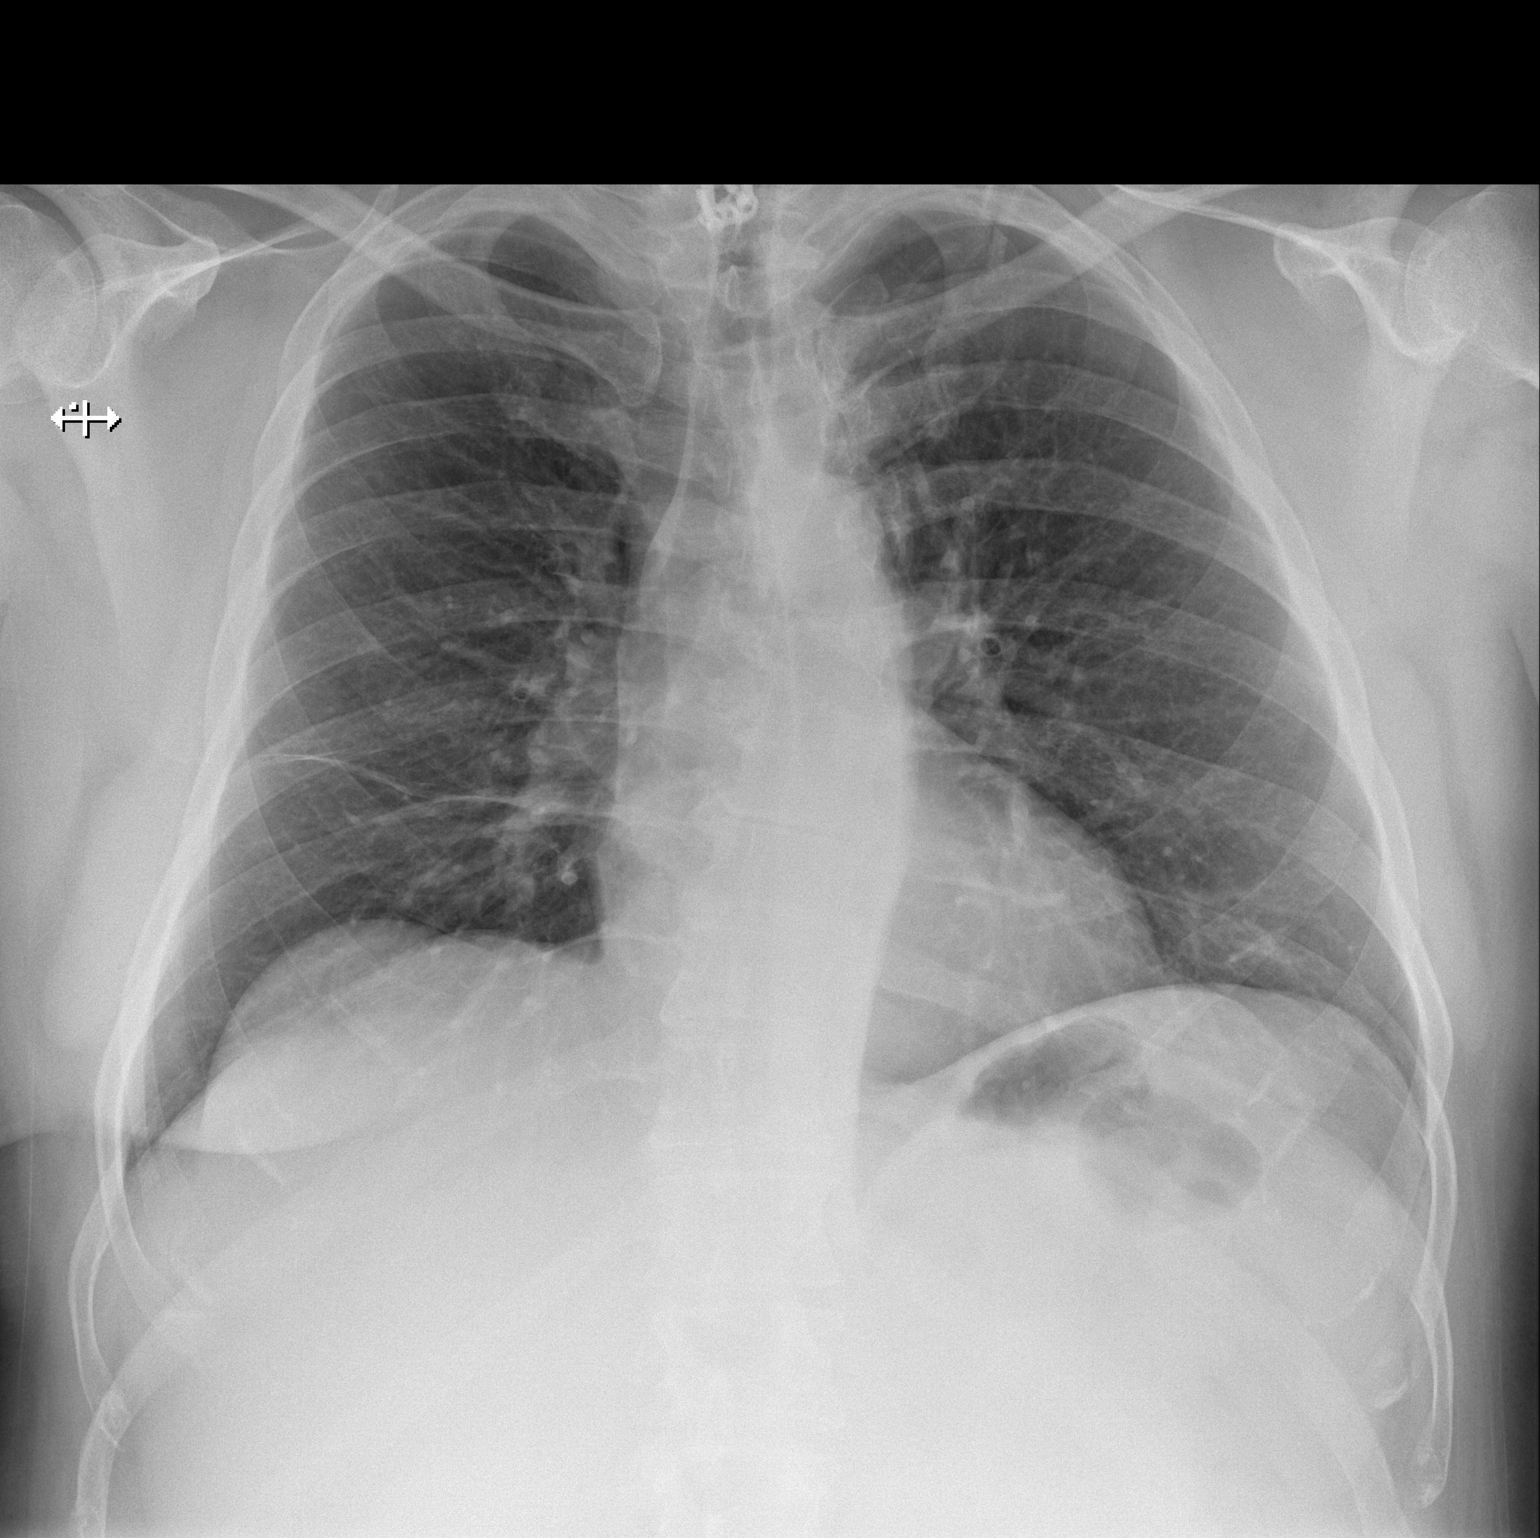

[w chest lat]
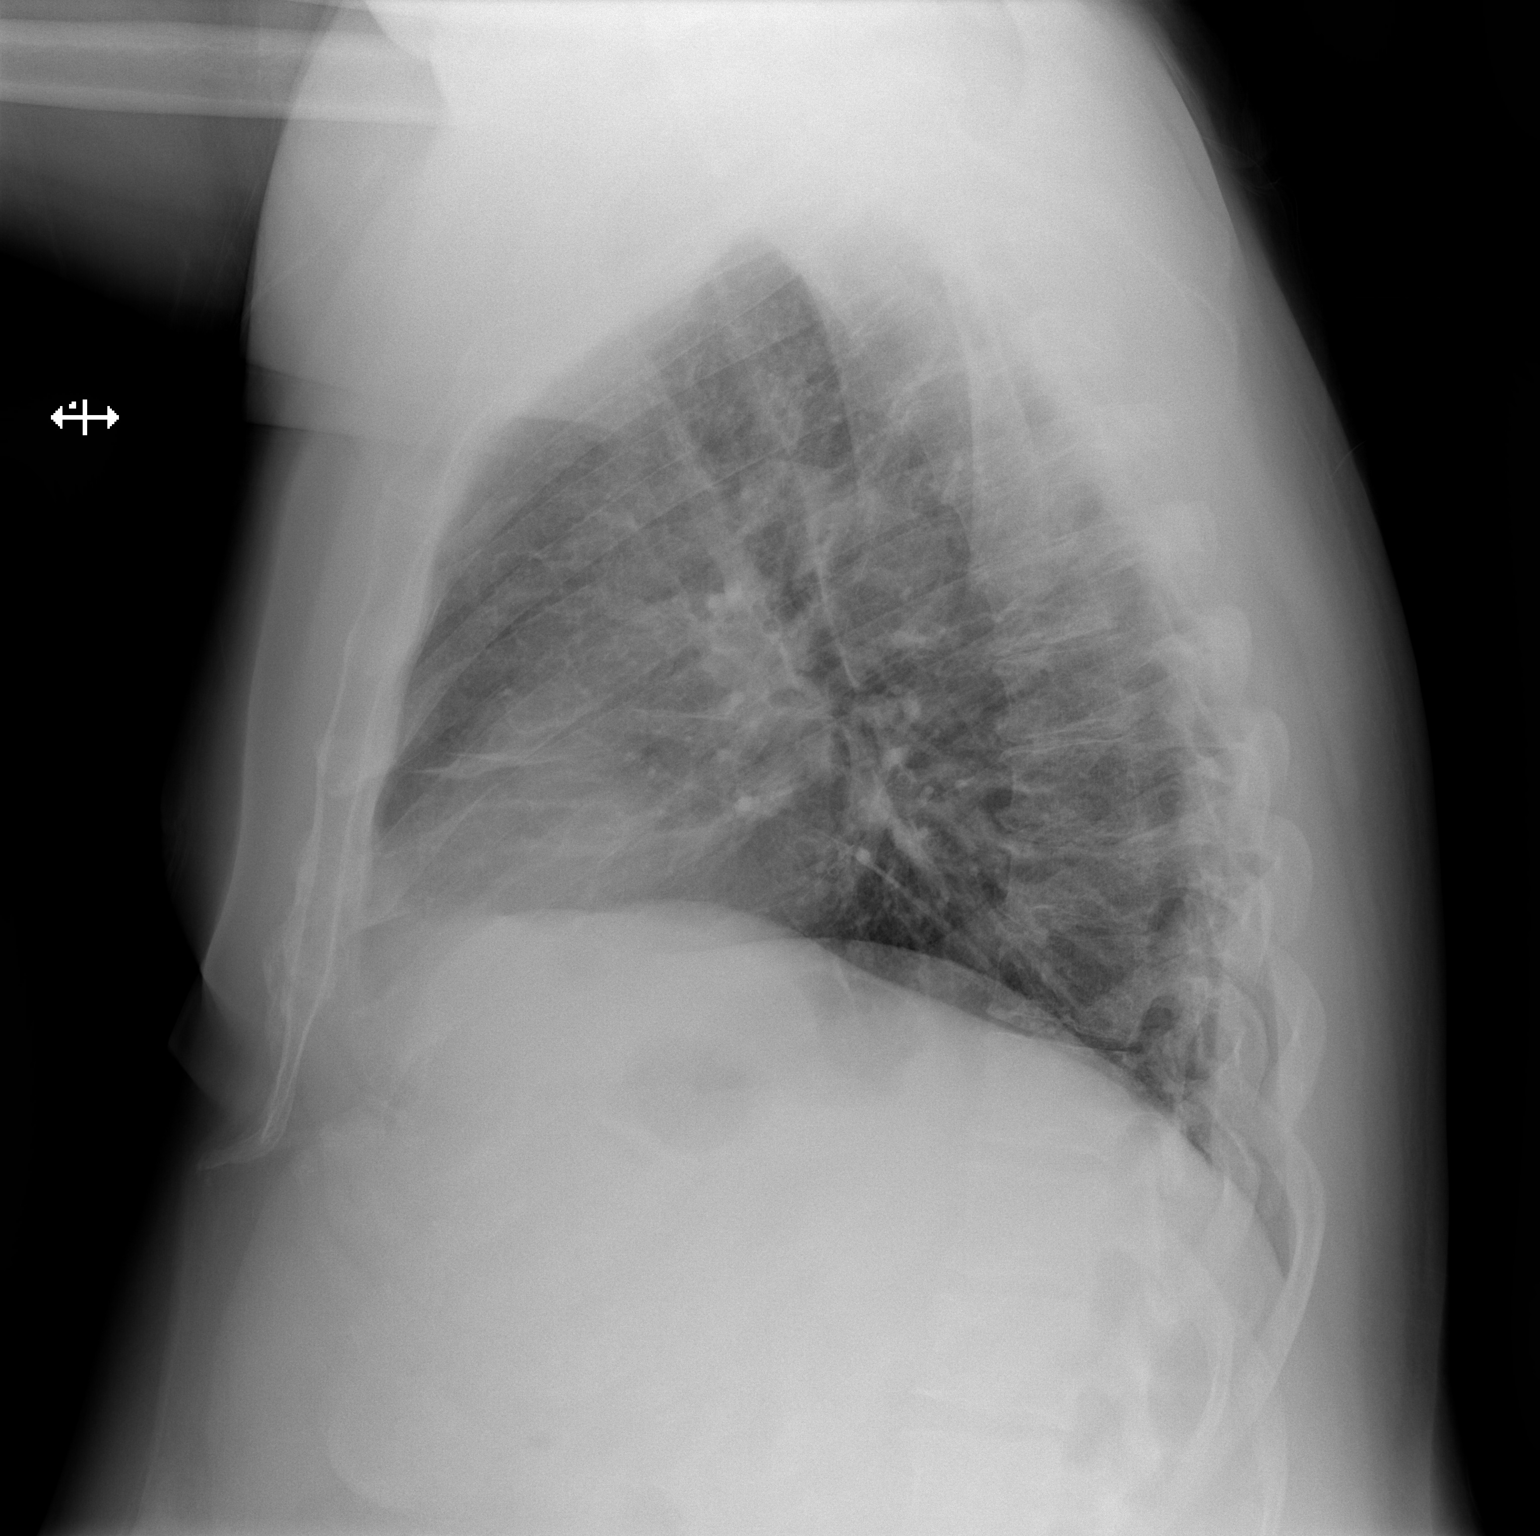

[2 of 2 positions shown; findings below may reference images not displayed]

FINDINGS: Stable levoscoliosis of the thoracic spine. Normal heart size and
vascularity. Chronic fissural scarring/ atelectasis in the right
midlung. No superimposed pneumonia, collapse or consolidation.
Negative for edema, effusion, or pneumothorax. Lower cervical fusion
hardware noted. Trachea is midline.
IMPRESSION: Stable exam. Chronic right midlung scarring. No superimposed acute
process.

## 2015-04-27 IMAGING — CR DG LUMBAR SPINE 2-3V
2 series · 2 of 2 positions shown · non-contrast
Comparison: None.

CLINICAL DATA: Posterior spinal fusion

EXAM:
LUMBAR SPINE - 2-3 VIEW

[xtable lateral (1 of 2)]
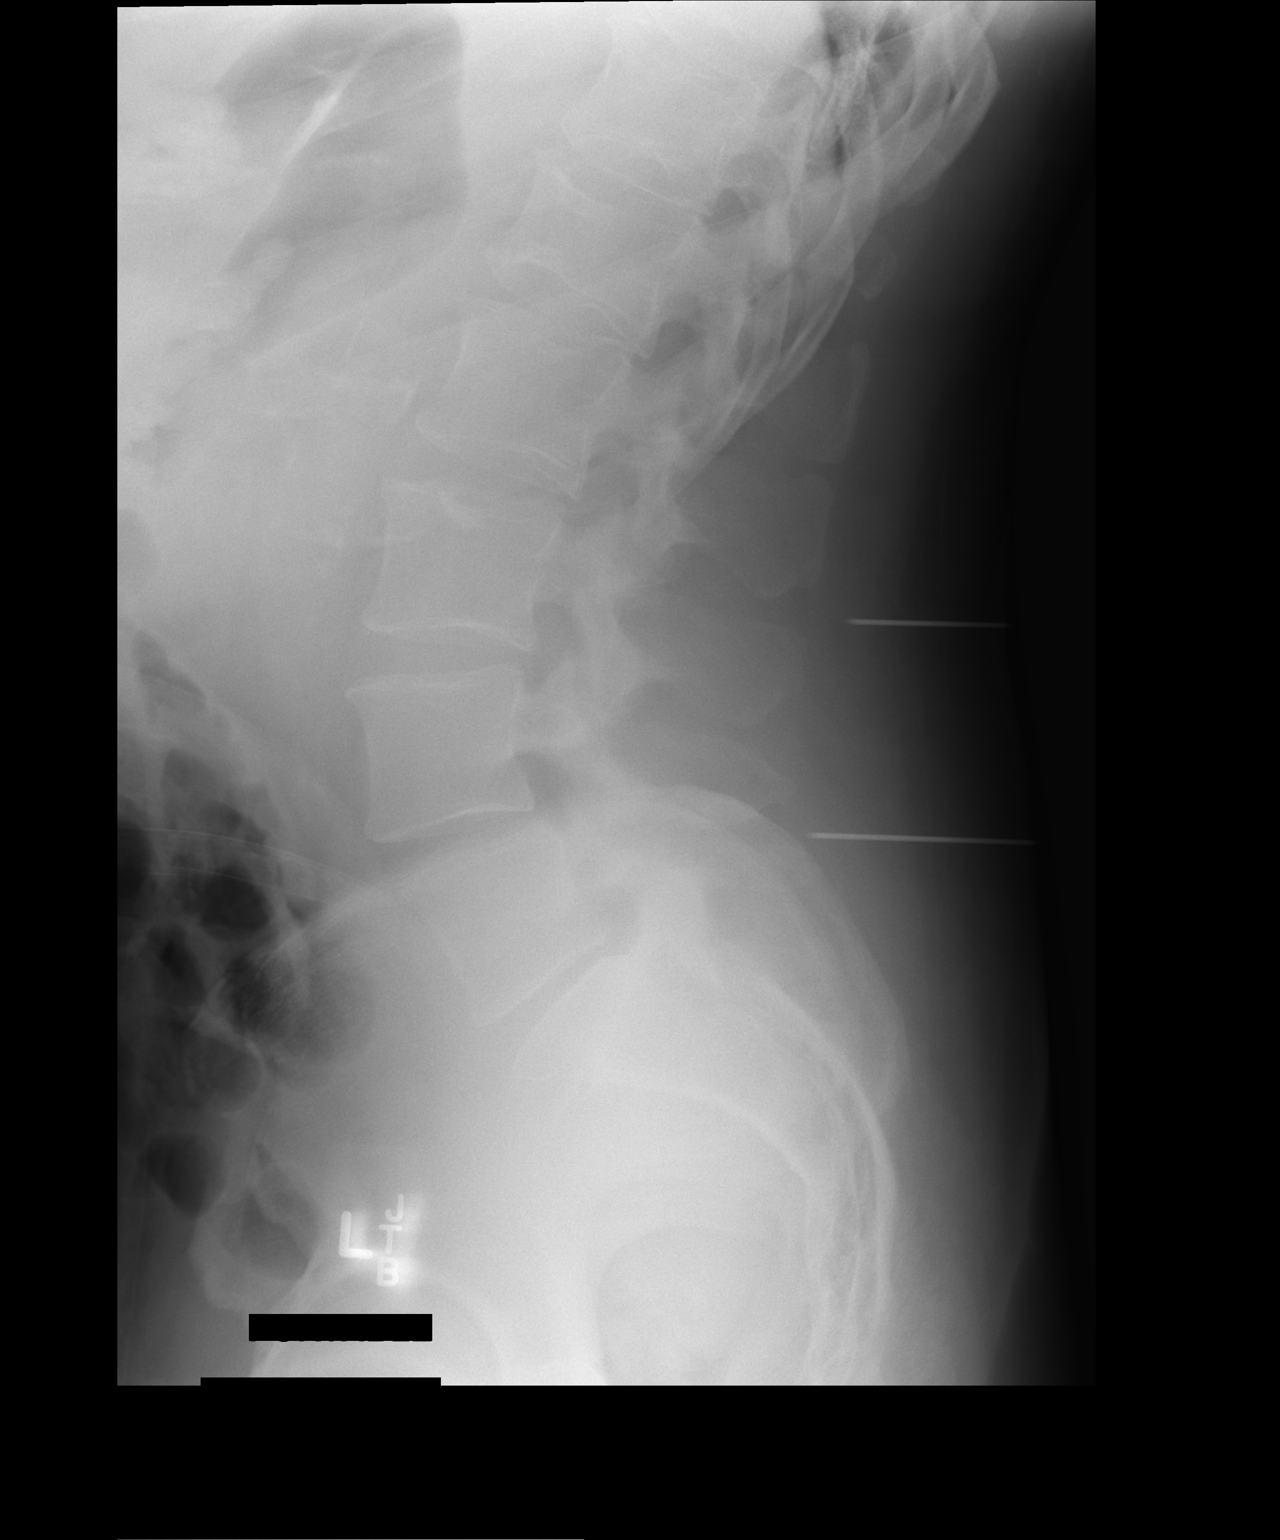

[xtable lateral (2 of 2)]
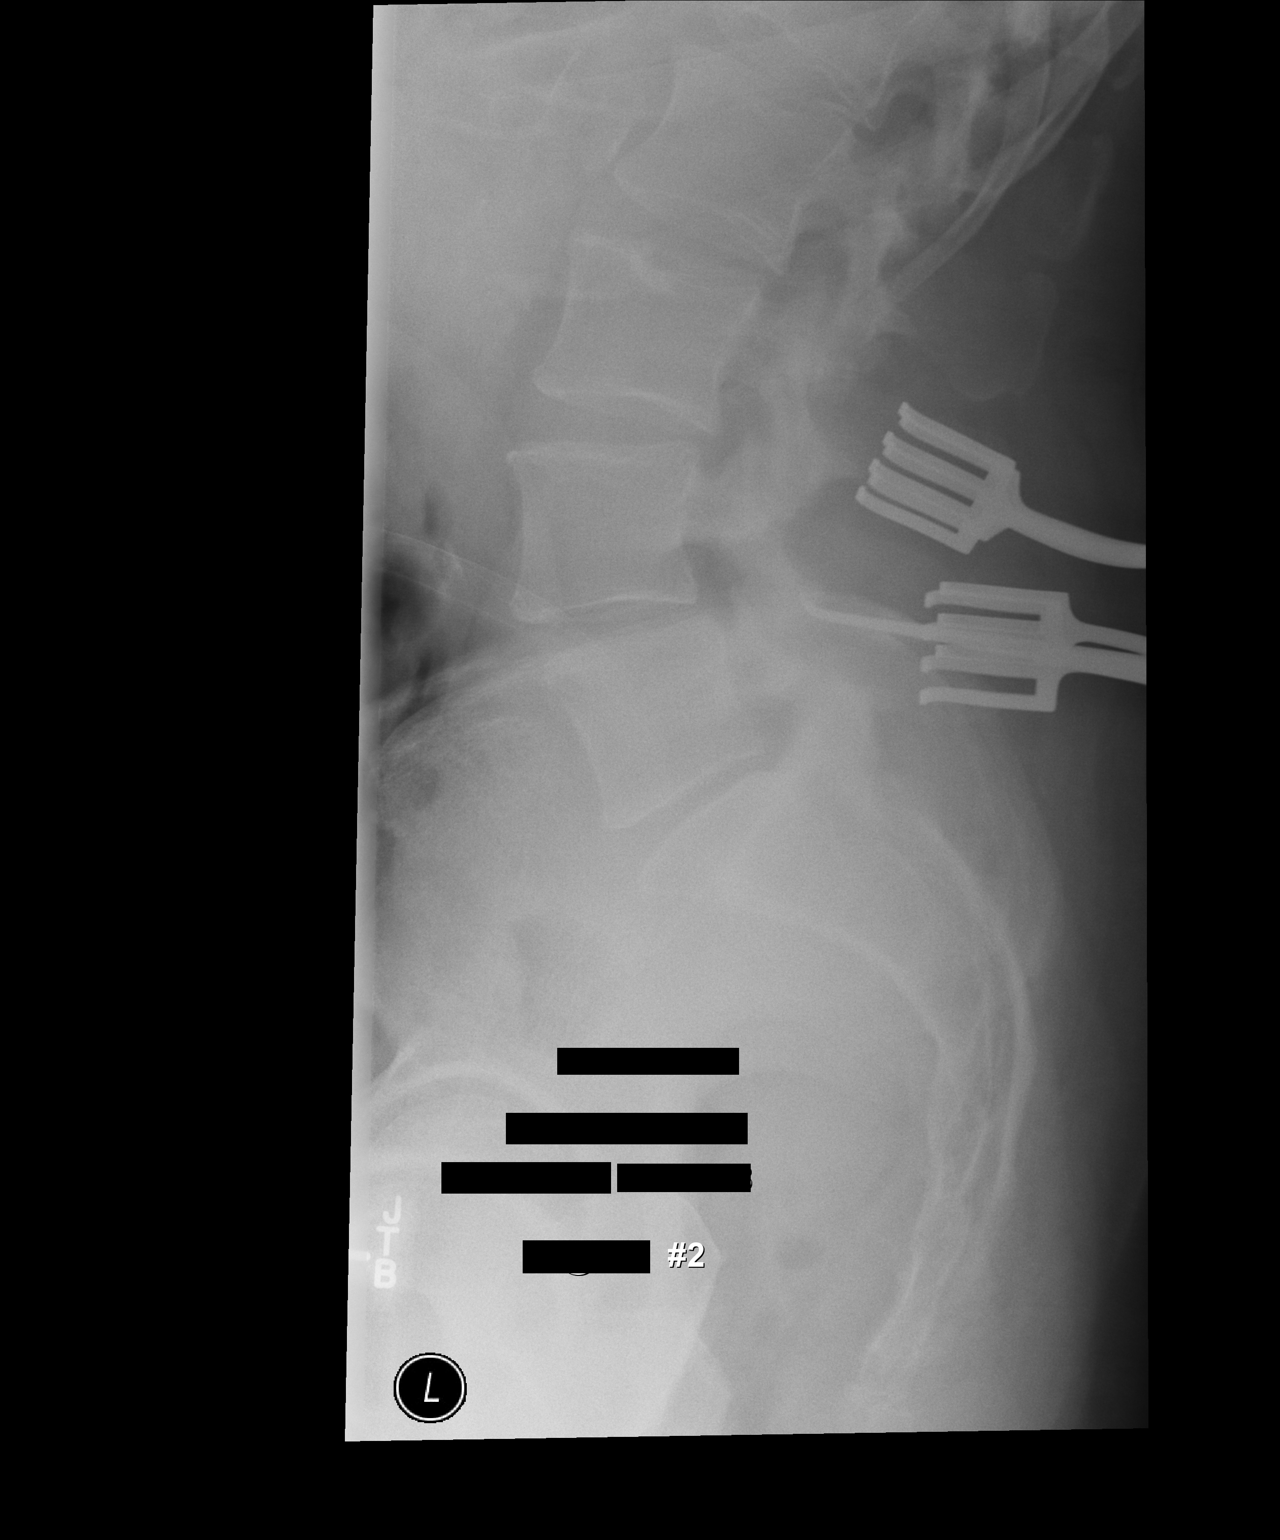

[2 of 2 positions shown; findings below may reference images not displayed]

FINDINGS: Two cross-table lateral images obtained. On the first image, there
is a metallic probe with the tip posterior to the inferior aspect of
the L3 vertebral body. A second probe is posterior to the mid L5
vertebral body. On the second submitted image, there is a metallic
probe tip posterior to the L4-5 interspace. There is no fracture.
There is minimal anterolisthesis of L4 on L5. No other
spondylolisthesis. There is moderate disc space narrowing at all
levels.
IMPRESSION: Metallic probe tip is posterior to L4-5 on the second submitted
cross-table lateral image. Disc space narrowing is noted at multiple
levels. There is minimal anterolisthesis of L4 on L5. No fracture.

## 2015-11-17 ENCOUNTER — Other Ambulatory Visit: Payer: Self-pay | Admitting: Orthopedic Surgery

## 2015-11-17 ENCOUNTER — Ambulatory Visit
Admission: RE | Admit: 2015-11-17 | Discharge: 2015-11-17 | Disposition: A | Discharge status: Home / Self Care | Payer: Worker's Compensation | Source: Ambulatory Visit | Attending: Orthopedic Surgery | Admitting: Orthopedic Surgery

## 2015-11-17 DIAGNOSIS — M79604 Pain in right leg: Secondary | ICD-10-CM

## 2015-11-17 DIAGNOSIS — M7989 Other specified soft tissue disorders: Secondary | ICD-10-CM

## 2017-05-08 ENCOUNTER — Other Ambulatory Visit: Payer: Self-pay | Admitting: Family Medicine

## 2017-05-08 DIAGNOSIS — R7401 Elevation of levels of liver transaminase levels: Secondary | ICD-10-CM

## 2017-05-08 DIAGNOSIS — R740 Nonspecific elevation of levels of transaminase and lactic acid dehydrogenase [LDH]: Principal | ICD-10-CM

## 2017-05-16 ENCOUNTER — Ambulatory Visit
Admission: RE | Admit: 2017-05-16 | Discharge: 2017-05-16 | Disposition: A | Discharge status: Home / Self Care | Payer: 59 | Source: Ambulatory Visit | Attending: Family Medicine | Admitting: Family Medicine

## 2017-05-16 DIAGNOSIS — R740 Nonspecific elevation of levels of transaminase and lactic acid dehydrogenase [LDH]: Principal | ICD-10-CM

## 2017-05-16 DIAGNOSIS — R7401 Elevation of levels of liver transaminase levels: Secondary | ICD-10-CM

## 2017-05-18 ENCOUNTER — Inpatient Hospital Stay (HOSPITAL_COMMUNITY): Admission: RE | Admit: 2017-05-18 | Payer: Self-pay | Source: Ambulatory Visit

## 2017-05-29 ENCOUNTER — Inpatient Hospital Stay: Admit: 2017-05-29 | Payer: Self-pay | Admitting: Orthopedic Surgery

## 2017-05-29 SURGERY — ARTHROPLASTY, KNEE, TOTAL
Anesthesia: General | Laterality: Left

## 2017-08-08 ENCOUNTER — Encounter (HOSPITAL_COMMUNITY): Payer: Self-pay | Admitting: Physician Assistant

## 2017-08-08 DIAGNOSIS — I4891 Unspecified atrial fibrillation: Secondary | ICD-10-CM | POA: Diagnosis present

## 2017-08-08 DIAGNOSIS — K5790 Diverticulosis of intestine, part unspecified, without perforation or abscess without bleeding: Secondary | ICD-10-CM | POA: Diagnosis present

## 2017-08-08 DIAGNOSIS — M1712 Unilateral primary osteoarthritis, left knee: Secondary | ICD-10-CM | POA: Diagnosis present

## 2017-08-11 ENCOUNTER — Encounter (HOSPITAL_COMMUNITY)
Admission: RE | Admit: 2017-08-11 | Discharge: 2017-08-11 | Disposition: A | Discharge status: Home / Self Care | Payer: 59 | Source: Ambulatory Visit | Attending: Orthopedic Surgery | Admitting: Orthopedic Surgery

## 2017-08-11 ENCOUNTER — Encounter (HOSPITAL_COMMUNITY): Payer: Self-pay

## 2017-08-11 DIAGNOSIS — Z87891 Personal history of nicotine dependence: Secondary | ICD-10-CM | POA: Insufficient documentation

## 2017-08-11 DIAGNOSIS — Z79899 Other long term (current) drug therapy: Secondary | ICD-10-CM | POA: Diagnosis not present

## 2017-08-11 DIAGNOSIS — M1712 Unilateral primary osteoarthritis, left knee: Secondary | ICD-10-CM | POA: Insufficient documentation

## 2017-08-11 DIAGNOSIS — Z01812 Encounter for preprocedural laboratory examination: Secondary | ICD-10-CM | POA: Insufficient documentation

## 2017-08-11 DIAGNOSIS — Z0183 Encounter for blood typing: Secondary | ICD-10-CM | POA: Insufficient documentation

## 2017-08-11 DIAGNOSIS — Z9049 Acquired absence of other specified parts of digestive tract: Secondary | ICD-10-CM | POA: Insufficient documentation

## 2017-08-11 DIAGNOSIS — Z981 Arthrodesis status: Secondary | ICD-10-CM | POA: Insufficient documentation

## 2017-08-11 DIAGNOSIS — Z01818 Encounter for other preprocedural examination: Secondary | ICD-10-CM | POA: Insufficient documentation

## 2017-08-11 LAB — CBC
HCT: 46.4 % (ref 39.0–52.0)
Hemoglobin: 15.9 g/dL (ref 13.0–17.0)
MCH: 31.3 pg (ref 26.0–34.0)
MCHC: 34.3 g/dL (ref 30.0–36.0)
MCV: 91.3 fL (ref 78.0–100.0)
Platelets: 206 10*3/uL (ref 150–400)
RBC: 5.08 MIL/uL (ref 4.22–5.81)
RDW: 13.8 % (ref 11.5–15.5)
WBC: 10.1 10*3/uL (ref 4.0–10.5)

## 2017-08-11 LAB — BASIC METABOLIC PANEL
Anion gap: 10 (ref 5–15)
BUN: 28 mg/dL — ABNORMAL HIGH (ref 6–20)
CO2: 24 mmol/L (ref 22–32)
Calcium: 9.5 mg/dL (ref 8.9–10.3)
Chloride: 106 mmol/L (ref 101–111)
Creatinine, Ser: 0.94 mg/dL (ref 0.61–1.24)
GFR calc Af Amer: >60 mL/min (ref >60)
GFR calc non Af Amer: >60 mL/min (ref >60)
Glucose, Bld: 108 mg/dL — ABNORMAL HIGH (ref 65–99)
Potassium: 4.9 mmol/L (ref 3.5–5.1)
Sodium: 140 mmol/L (ref 135–145)

## 2017-08-11 LAB — SURGICAL PCR SCREEN
MRSA, PCR: NEGATIVE
Staphylococcus aureus: POSITIVE — AB

## 2017-08-11 LAB — TYPE AND SCREEN
ABO/RH(D): O POS
Antibody Screen: NEGATIVE

## 2017-08-11 NOTE — Pre-Procedure Instructions (Signed)
RAYE BICKHART  08/11/2017      [facility] [address] [place] - [address] [place] [address] Phone: [phone] Fax: [phone]  [facility] [address] - [place], [place] - [address] AT [facility] [address] [place] [address] Phone: [phone] Fax: [phone]    Your procedure is scheduled on August 27  Report to [facility] Admitting at 0700 A.M.  Call this number if you have problems the morning of surgery:  [phone]   Remember:  Do not eat food or drink liquids after midnight.  Continue all other medications as directed by your physician except follow these instructions about you medications   Take these medicines the morning of surgery with A SIP OF WATER acetaminophen (TYLENOL),  atenolol (TENORMIN) , methocarbamol (ROBAXIN),  traMADol (ULTRAM)   7 days prior to surgery STOP taking any Aspirin, Aleve, Naproxen, Ibuprofen, Motrin, Advil, Goody's, BC's, all herbal medications, fish oil, and all vitamins diclofenac (VOLTAREN)     Do not wear jewelry  Do not wear lotions, powders, or cologne, or deoderant.  Men may shave face and neck.  Do not bring valuables to the hospital.  [facility] is not responsible for any belongings or valuables.  Contacts, dentures or bridgework may not be worn into surgery.  Leave your suitcase in the car.  After surgery it may be brought to your room.  For patients admitted to the hospital, discharge time will be determined by your treatment team.  Patients discharged the day of surgery will not be allowed to drive home.   Special instructions:   [facility]- Preparing For Surgery  Before surgery, you can play an important role. Because skin is not sterile, your skin needs to be as free of germs as possible. You can reduce the number of germs on your skin by washing with CHG (chlorahexidine gluconate) Soap before  surgery.  CHG is an antiseptic cleaner which kills germs and bonds with the skin to continue killing germs even after washing.  Please do not use if you have an allergy to CHG or antibacterial soaps. If your skin becomes reddened/irritated stop using the CHG.  Do not shave (including legs and underarms) for at least 48 hours prior to first CHG shower. It is OK to shave your face.  Please follow these instructions carefully.   1. Shower the NIGHT BEFORE SURGERY and the MORNING OF SURGERY with CHG.   2. If you chose to wash your hair, wash your hair first as usual with your normal shampoo.  3. After you shampoo, rinse your hair and body thoroughly to remove the shampoo.  4. Use CHG as you would any other liquid soap. You can apply CHG directly to the skin and wash gently with a scrungie or a clean washcloth.   5. Apply the CHG Soap to your body ONLY FROM THE NECK DOWN.  Do not use on open wounds or open sores. Avoid contact with your eyes, ears, mouth and genitals (private parts). Wash genitals (private parts) with your normal soap.  6. Wash thoroughly, paying special attention to the area where your surgery will be performed.  7. Thoroughly rinse your body with warm water from the neck down.  8. DO NOT shower/wash with your normal soap after using and rinsing off the CHG Soap.  9. Pat yourself dry with a CLEAN TOWEL.   10. Wear CLEAN PAJAMAS  11. Place CLEAN SHEETS on your bed the night of your first shower and DO NOT SLEEP WITH PETS.    Day of Surgery: Do not apply any deodorants/lotions. Please wear clean clothes to the hospital/surgery center.      Please read over the following fact sheets that you were given.

## 2017-08-11 NOTE — Progress Notes (Signed)
Prescription called in at [facility] on [address], patient notified of result and prescription

## 2017-08-11 NOTE — Progress Notes (Signed)
PCP - [name]  Cardiologist - Viann Fish  Chest x-ray - not needed EKG - requesting Stress Test - requesting ECHO - 10-15 years ago Cardiac Cath - denies   Patient denies shortness of breath, fever, cough and chest pain at PAT appointment   Patient verbalized understanding of instructions that were given to them at the PAT appointment. Patient was also instructed that they will need to review over the PAT instructions again at home before surgery.

## 2017-08-12 LAB — URINE CULTURE: Culture: NO GROWTH

## 2017-08-14 ENCOUNTER — Encounter (HOSPITAL_COMMUNITY): Payer: Self-pay

## 2017-08-14 NOTE — Progress Notes (Signed)
Anesthesia Chart Review: Patient is a 56 year old male scheduled for left TKA on 08/21/17 by Dr. Salvatore Marvel.   History includes former smoker (quit '13), afib/flutter/PAF (diagnosed 05/2004; required adenosine 01/09/06), sigmoid diverticulitis with abscess s/p sigmoid colectomy with colostomy 12/18/06 (s/p takedown 05/04/07), SBO s/p LOA and repair of ventral hernias 10/22/07, C5-7 ACDF 04/20/10, L4-5 PLIF 11/19/14, right thumb reconstruction 11/25/08 (follwoing sheet metal laceration).   - PCP is Dr. [name]. - Cardiologist is Dr. Viann Fish. Last visit 07/18/17. He wrote, "from a cardiovascular viewpoint may proceed with the planned knee replacement. His cardiovascular risk should be average at this time...twelve-lead EKG personally reviewed by me shows sinus rhythm with PACs. He will be at some risk of atrial arrhythmias surgery and he should continue his current medications with a sip of water the morning of surgery."  Meds include atenolol, green tea extract, Robaxin, milk thistle, Zanaflex, tramadol, turmeric.  BP 125/74   Pulse 71   Temp 36.8 C   Resp 20   Ht 5\' 8"  (1.727 m)   Wt 245 lb 8 oz (111.4 kg)   SpO2 95%   BMI 37.33 kg/m   EKG 07/18/17 (Dr. Donnie Aho): SR with PACs.   According to 07/18/17 office note by Dr. Donnie Aho: "Cardiology Procedures-Noninvasive: echocardiogram 2000, holter monitor February 2005, adenosine cardiolite October 2007, event monitor July 2008; LVEF of 65% documented via echocardiogram on 07/12/2004."  (Copy of 10/19/06 Cardiolite done at Dr. York Spaniel office received and did show apical ischemia, EF 56%. There is no documentation indicating that patient ever was referred for cardiac cath--and Dr. Donnie Aho has been following him since 12/2005. Patient did, however, go on to have multiple surgeries for sigmoid diverticulitis within the 12 months following his abnormal stress test. More recently he underwent ACDF [2011] and lumbar fusion [2015]. 2011 and 2015  notes by Dr. Donnie Aho note patient's history of abnormal stress test.)  Preoperative labs noted. Cr 0.94. Glucose 108. CBC WNL.Urine culture showed no growth.  Patient has had recent evaluation with clearance for surgery by his cardiologist. If no acute changes then I would anticipate that he can proceed as planned.  Velna Ochs [facility] Stay [facility]/Anesthesiology Phone [phone] 08/14/2017 4:09 PM

## 2017-08-16 NOTE — H&P (Signed)
TOTAL KNEE ADMISSION H&P  Patient is being admitted for left total knee arthroplasty.  Subjective:  Chief Complaint:left knee pain.  HPI: [name], 56 y.o. male, has a history of pain and functional disability in the left knee due to arthritis and has failed non-surgical conservative treatments for greater than 12 weeks to includeNSAID's and/or analgesics, corticosteriod injections, viscosupplementation injections, flexibility and strengthening excercises, use of assistive devices, weight reduction as appropriate and activity modification.  Onset of symptoms was gradual, starting 10 years ago with gradually worsening course since that time. The patient noted prior procedures on the knee to include  arthroscopy, menisectomy and ACL reconstruction on the left knee(s).  Patient currently rates pain in the left knee(s) at 10 out of 10 with activity. Patient has night pain, worsening of pain with activity and weight bearing, pain that interferes with activities of daily living, crepitus and joint swelling.  Patient has evidence of subchondral sclerosis, periarticular osteophytes and joint space narrowing by imaging studies.  There is no active infection.  Patient Active Problem List   Diagnosis Date Noted  . Atrial fibrillation (HCC) 08/08/2017  . Diverticular disease   . Primary localized osteoarthritis of left knee   . Spinal stenosis 11/19/2014  . Diarrhea 07/26/2012   Past Medical History:  Diagnosis Date  . Arthritis   . Atrial fibrillation (HCC)    diagnosed 05/2004 (PAF/afib-flutter)  . Diverticular disease   . Dysrhythmia    PAF  . Primary localized osteoarthritis of left knee     Past Surgical History:  Procedure Laterality Date  . COLON SURGERY  12/07, 5/08, 10/08  . COLONOSCOPY    . COLOSTOMY  2007  . COLOSTOMY REVERSAL    . EYE SURGERY Bilateral    cataracts  . INCISIONAL HERNIA REPAIR  2008   with mesh  . KNEE ARTHROSCOPY Left 5/10   x2  . MULTIPLE TOOTH  EXTRACTIONS    . neck fusion  5/11  . REPLANTATION THUMB Right   . TONSILLECTOMY    . WISDOM TOOTH EXTRACTION      No current facility-administered medications for this encounter.   Current Outpatient Prescriptions:  .  acetaminophen (TYLENOL) 500 MG tablet, Take 1,000 mg by mouth 2 (two) times daily as needed for mild pain or moderate pain., Disp: , Rfl:  .  atenolol (TENORMIN) 50 MG tablet, Take 50 mg by mouth daily., Disp: , Rfl:  .  diclofenac (VOLTAREN) 75 MG EC tablet, Take 75 mg by mouth 2 (two) times daily., Disp: , Rfl:  .  diphenhydrAMINE (BENADRYL) 25 MG tablet, Take 25 mg by mouth every 6 (six) hours as needed for allergies or sleep., Disp: , Rfl:  .  docusate sodium (COLACE) 100 MG capsule, Take 100 mg by mouth daily as needed for mild constipation. Takes Equate Stool  [place] , Disp: , Rfl:  .  Glucosamine HCl 1500 MG TABS, Take 1,500 mg by mouth daily., Disp: , Rfl:  .  Green Tea, Camillia sinensis, (GREEN TEA EXTRACT PO), Take 500 mg by mouth daily., Disp: , Rfl:  .  methocarbamol (ROBAXIN) 500 MG tablet, Take 500 mg by mouth daily as needed for muscle spasms. , Disp: , Rfl:  .  milk thistle 175 MG tablet, Take 175 mg by mouth daily., Disp: , Rfl:  .  Multiple Vitamin (MULTIVITAMIN WITH MINERALS) TABS tablet, Take 1 tablet by mouth daily. For Men's, Disp: , Rfl:  .  tiZANidine (ZANAFLEX) 4 MG tablet, Take 4 mg  by mouth at bedtime as needed (sleep)., Disp: , Rfl:  .  traMADol (ULTRAM) 50 MG tablet, Take 100 mg by mouth every 6 (six) hours. , Disp: , Rfl:  .  TURMERIC PO, Take 285 mg by mouth daily., Disp: , Rfl:  .  OxyCODONE (OXYCONTIN) 20 mg T12A 12 hr tablet, Take 1 tablet (20 mg total) by mouth every 12 (twelve) hours. (Patient not taking: Reported on 08/09/2017), Disp: 20 tablet, Rfl: 0 Allergies  Allergen Reactions  . Percocet [Oxycodone-Acetaminophen] Rash    Turned orange. Patient states this occurred two days after medication was started, has had Percocet this that  time. Dr. Yevette Edwards and Dr. Janee Morn are in agreement to trust Oxycontin for patient and if any rash occurs, medication will be stopped. Pharmacy notified.  Marland Kitchen Z-Pak [Azithromycin]     Severe stomach cramps   . Mobic [Meloxicam] Other (See Comments)    Lower back and kidney pain   . Prednisone Hives and Other (See Comments)    Makes him anxious  . Pregabalin Other (See Comments)    Dry mouth and out-of-body feeling  . Red Dye Other (See Comments)    Turns red Feels like skin on fire  . Erythromycin Other (See Comments)    Abdominal cramps   . Hydrocodone-Acetaminophen Other (See Comments)    Makes him feel "high"  . Morphine And Related Other (See Comments)    Abdominal cramping  Migraine headaches  . Penicillins Rash    Has patient had a PCN reaction causing immediate rash, facial/tongue/throat swelling, SOB or lightheadedness with hypotension: No Has patient had a PCN reaction causing severe rash involving mucus membranes or skin necrosis: No Has patient had a PCN reaction that required hospitalization: No Has patient had a PCN reaction occurring within the last 10 years: No If all of the above answers are "NO", then may proceed with Cephalosporin use.      Social History  Substance Use Topics  . Smoking status: Former Smoker    Years: 2.00    Types: Cigarettes    Quit date: 08/11/2012  . Smokeless tobacco: Never Used  . Alcohol use 8.4 oz/week    14 Cans of beer per week     Comment: 2 beers/ day    No family history on file.   Review of Systems  Constitutional: Negative.   HENT: Negative.   Eyes: Negative.   Respiratory: Negative.   Cardiovascular: Negative.   Gastrointestinal: Negative.   Genitourinary: Negative.   Musculoskeletal: Positive for back pain and joint pain.  Skin: Negative.   Neurological: Negative.   Endo/Heme/Allergies: Negative.   Psychiatric/Behavioral: Negative.     Objective:  Physical Exam  Constitutional: He is oriented to person,  place, and time. He appears well-nourished.  HENT:  Head: Normocephalic and atraumatic.  Eyes: Pupils are equal, round, and reactive to light. Conjunctivae are normal.  Neck: Neck supple.  Cardiovascular: Normal rate.   Irregular rhythm (afib rate controlled)  Respiratory: Effort normal.  GI: Soft.  Genitourinary:  Genitourinary Comments: Not pertinent to current symptomatology therefore not examined.  Musculoskeletal:  Examination of his left knee reveals pain medially and laterally. There is 1+ effusion. Mild valgus deformity. The range of motion is 0-120 degrees. The knee is stable with normal patellar tracking. Examination of the right knee reveals a full  range of motion without pain, swelling weakness or instability. Vascular exam: pulses 2+ and symmetric. Neurologic, distal motor and sensory examinations within normal limits.  X-RAYS:   Neurological: He is alert and oriented to person, place, and time.  Skin: Skin is warm.  Psychiatric: He has a normal mood and affect.    Vital signs in last 24 hours: Temp:  [97.9 F (36.6 C)] 97.9 F (36.6 C) (08/22 2000) Pulse Rate:  [69] 69 (08/22 2000) BP: (121)/(80) 121/80 (08/22 2000) SpO2:  [95 %] 95 % (08/22 2000) Weight:  [112.7 kg (248 lb 6.4 oz)] 112.7 kg (248 lb 6.4 oz) (08/22 2000)  Labs:   Estimated body mass index is 37.77 kg/m as calculated from the following:   Height as of this encounter: 5\' 8"  (1.727 m).   Weight as of this encounter: 112.7 kg (248 lb 6.4 oz).   Imaging Review Plain radiographs demonstrate severe degenerative joint disease of the left knee(s). The overall alignment issignificant varus. The bone quality appears to be good for age and reported activity level.  Assessment/Plan:  End stage arthritis, left knee  Principal Problem:   Primary localized osteoarthritis of left knee Active Problems:   Spinal stenosis   Atrial fibrillation (HCC)   Diverticular disease   The patient history,  physical examination, clinical judgment of the provider and imaging studies are consistent with end stage degenerative joint disease of the left knee(s) and total knee arthroplasty is deemed medically necessary. The treatment options including medical management, injection therapy arthroscopy and arthroplasty were discussed at length. The risks and benefits of total knee arthroplasty were presented and reviewed. The risks due to aseptic loosening, infection, stiffness, patella tracking problems, thromboembolic complications and other imponderables were discussed. The patient acknowledged the explanation, agreed to proceed with the plan and consent was signed. Patient is being admitted for inpatient treatment for surgery, pain control, PT, OT, prophylactic antibiotics, VTE prophylaxis, progressive ambulation and ADL's and discharge planning. The patient is planning to be discharged home with home health services

## 2017-08-23 ENCOUNTER — Inpatient Hospital Stay (HOSPITAL_COMMUNITY): Payer: 59 | Admitting: Vascular Surgery

## 2017-08-23 ENCOUNTER — Encounter (HOSPITAL_COMMUNITY)
Admission: RE | Disposition: A | Discharge status: Home Health | Payer: Self-pay | Source: Ambulatory Visit | Attending: Orthopedic Surgery

## 2017-08-23 ENCOUNTER — Encounter (HOSPITAL_COMMUNITY): Payer: Self-pay | Admitting: *Deleted

## 2017-08-23 ENCOUNTER — Inpatient Hospital Stay (HOSPITAL_COMMUNITY)
Admission: RE | Admit: 2017-08-23 | Discharge: 2017-08-24 | DRG: 470 | Disposition: A | Discharge status: Home Health | Payer: 59 | Source: Ambulatory Visit | Attending: Orthopedic Surgery | Admitting: Orthopedic Surgery

## 2017-08-23 ENCOUNTER — Inpatient Hospital Stay (HOSPITAL_COMMUNITY): Payer: 59 | Admitting: Anesthesiology

## 2017-08-23 DIAGNOSIS — K5790 Diverticulosis of intestine, part unspecified, without perforation or abscess without bleeding: Secondary | ICD-10-CM | POA: Diagnosis present

## 2017-08-23 DIAGNOSIS — Z79891 Long term (current) use of opiate analgesic: Secondary | ICD-10-CM | POA: Diagnosis not present

## 2017-08-23 DIAGNOSIS — Z888 Allergy status to other drugs, medicaments and biological substances status: Secondary | ICD-10-CM

## 2017-08-23 DIAGNOSIS — M4800 Spinal stenosis, site unspecified: Secondary | ICD-10-CM | POA: Diagnosis present

## 2017-08-23 DIAGNOSIS — M1712 Unilateral primary osteoarthritis, left knee: Principal | ICD-10-CM | POA: Diagnosis present

## 2017-08-23 DIAGNOSIS — Z9102 Food additives allergy status: Secondary | ICD-10-CM | POA: Diagnosis not present

## 2017-08-23 DIAGNOSIS — I480 Paroxysmal atrial fibrillation: Secondary | ICD-10-CM | POA: Diagnosis present

## 2017-08-23 DIAGNOSIS — Z885 Allergy status to narcotic agent status: Secondary | ICD-10-CM

## 2017-08-23 DIAGNOSIS — Z79899 Other long term (current) drug therapy: Secondary | ICD-10-CM

## 2017-08-23 DIAGNOSIS — Z881 Allergy status to other antibiotic agents status: Secondary | ICD-10-CM

## 2017-08-23 DIAGNOSIS — Z87891 Personal history of nicotine dependence: Secondary | ICD-10-CM | POA: Diagnosis not present

## 2017-08-23 DIAGNOSIS — M25762 Osteophyte, left knee: Secondary | ICD-10-CM | POA: Diagnosis present

## 2017-08-23 DIAGNOSIS — M21062 Valgus deformity, not elsewhere classified, left knee: Secondary | ICD-10-CM | POA: Diagnosis present

## 2017-08-23 DIAGNOSIS — Z981 Arthrodesis status: Secondary | ICD-10-CM

## 2017-08-23 DIAGNOSIS — M25562 Pain in left knee: Secondary | ICD-10-CM | POA: Diagnosis not present

## 2017-08-23 DIAGNOSIS — Z880 Allergy status to penicillin: Secondary | ICD-10-CM

## 2017-08-23 DIAGNOSIS — K5730 Diverticulosis of large intestine without perforation or abscess without bleeding: Secondary | ICD-10-CM | POA: Diagnosis present

## 2017-08-23 DIAGNOSIS — I4891 Unspecified atrial fibrillation: Secondary | ICD-10-CM | POA: Diagnosis present

## 2017-08-23 HISTORY — PX: TOTAL KNEE ARTHROPLASTY: SHX125

## 2017-08-23 HISTORY — DX: Unilateral primary osteoarthritis, left knee: M17.12

## 2017-08-23 LAB — CBC
HCT: 40.9 % (ref 39.0–52.0)
Hemoglobin: 13.6 g/dL (ref 13.0–17.0)
MCH: 30.9 pg (ref 26.0–34.0)
MCHC: 33.3 g/dL (ref 30.0–36.0)
MCV: 93 fL (ref 78.0–100.0)
Platelets: 181 10*3/uL (ref 150–400)
RBC: 4.4 MIL/uL (ref 4.22–5.81)
RDW: 14.4 % (ref 11.5–15.5)
WBC: 12.5 10*3/uL — ABNORMAL HIGH (ref 4.0–10.5)

## 2017-08-23 LAB — CREATININE, SERUM
Creatinine, Ser: 0.77 mg/dL (ref 0.61–1.24)
GFR calc Af Amer: >60 mL/min (ref >60)
GFR calc non Af Amer: >60 mL/min (ref >60)

## 2017-08-23 SURGERY — ARTHROPLASTY, KNEE, TOTAL
Anesthesia: Monitor Anesthesia Care | Site: Knee | Laterality: Left

## 2017-08-23 MED ORDER — POVIDONE-IODINE 7.5 % EX SOLN
Freq: Once | CUTANEOUS | Status: DC
Start: 2017-08-23 — End: 2017-08-23

## 2017-08-23 MED ORDER — METHOCARBAMOL 500 MG PO TABS
500.0000 mg | ORAL_TABLET | Freq: Three times a day (TID) | ORAL | Status: DC | PRN
  Administered 2017-08-23 – 2017-08-24 (×3): 500 mg via ORAL
  Filled 2017-08-23 (×3): qty 1

## 2017-08-23 MED ORDER — NEOSTIGMINE METHYLSULFATE 5 MG/5ML IV SOSY
PREFILLED_SYRINGE | INTRAVENOUS | Status: AC
  Filled 2017-08-23: qty 5

## 2017-08-23 MED ORDER — HYDROMORPHONE HCL 1 MG/ML IJ SOLN
1.0000 mg | INTRAMUSCULAR | Status: DC | PRN
  Administered 2017-08-23 (×3): 1 mg via INTRAVENOUS
  Filled 2017-08-23 (×2): qty 1

## 2017-08-23 MED ORDER — ACETAMINOPHEN 325 MG PO TABS
650.0000 mg | ORAL_TABLET | Freq: Four times a day (QID) | ORAL | Status: DC | PRN
Start: 2017-08-23 — End: 2017-08-24

## 2017-08-23 MED ORDER — PROPOFOL 1000 MG/100ML IV EMUL
INTRAVENOUS | Status: AC
  Filled 2017-08-23: qty 100

## 2017-08-23 MED ORDER — ALUM & MAG HYDROXIDE-SIMETH 200-200-20 MG/5ML PO SUSP: 30.0000 mL | ORAL | Status: DC | PRN

## 2017-08-23 MED ORDER — CHLORHEXIDINE GLUCONATE 4 % EX LIQD: 60.0000 mL | Freq: Once | CUTANEOUS | Status: DC

## 2017-08-23 MED ORDER — ROPIVACAINE HCL 7.5 MG/ML IJ SOLN
INTRAMUSCULAR | Status: DC | PRN
  Administered 2017-08-23: 20 mL via PERINEURAL

## 2017-08-23 MED ORDER — ATENOLOL 50 MG PO TABS
50.0000 mg | ORAL_TABLET | Freq: Every day | ORAL | Status: DC
  Administered 2017-08-24: 50 mg via ORAL
  Filled 2017-08-23: qty 1

## 2017-08-23 MED ORDER — BUPIVACAINE IN DEXTROSE 0.75-8.25 % IT SOLN
INTRATHECAL | Status: DC | PRN
  Administered 2017-08-23: 1.8 mL via INTRATHECAL

## 2017-08-23 MED ORDER — MIDAZOLAM HCL 2 MG/2ML IJ SOLN
INTRAMUSCULAR | Status: AC
  Filled 2017-08-23: qty 2

## 2017-08-23 MED ORDER — MIDAZOLAM HCL 2 MG/2ML IJ SOLN
2.0000 mg | Freq: Once | INTRAMUSCULAR | Status: AC
  Administered 2017-08-23: 2 mg via INTRAVENOUS

## 2017-08-23 MED ORDER — ACETAMINOPHEN 500 MG PO TABS
1000.0000 mg | ORAL_TABLET | Freq: Four times a day (QID) | ORAL | Status: DC
  Administered 2017-08-23 – 2017-08-24 (×2): 1000 mg via ORAL
  Filled 2017-08-23 (×3): qty 2

## 2017-08-23 MED ORDER — MIDAZOLAM HCL 5 MG/5ML IJ SOLN
INTRAMUSCULAR | Status: DC | PRN
  Administered 2017-08-23 (×2): 1 mg via INTRAVENOUS

## 2017-08-23 MED ORDER — 0.9 % SODIUM CHLORIDE (POUR BTL) OPTIME
TOPICAL | Status: DC | PRN
  Administered 2017-08-23: 1000 mL

## 2017-08-23 MED ORDER — SODIUM CHLORIDE 0.9 % IJ SOLN
INTRAMUSCULAR | Status: AC
  Filled 2017-08-23: qty 10

## 2017-08-23 MED ORDER — POTASSIUM CHLORIDE IN NACL 20-0.9 MEQ/L-% IV SOLN: INTRAVENOUS | Status: DC

## 2017-08-23 MED ORDER — GABAPENTIN 300 MG PO CAPS
300.0000 mg | ORAL_CAPSULE | Freq: Three times a day (TID) | ORAL | Status: DC
  Administered 2017-08-23 – 2017-08-24 (×4): 300 mg via ORAL
  Filled 2017-08-23 (×4): qty 1

## 2017-08-23 MED ORDER — PROPOFOL 10 MG/ML IV BOLUS
INTRAVENOUS | Status: DC | PRN
  Administered 2017-08-23: 20 mg via INTRAVENOUS

## 2017-08-23 MED ORDER — PROPOFOL 500 MG/50ML IV EMUL
INTRAVENOUS | Status: DC | PRN
  Administered 2017-08-23: 100 ug/kg/min via INTRAVENOUS

## 2017-08-23 MED ORDER — ONDANSETRON HCL 4 MG/2ML IJ SOLN
INTRAMUSCULAR | Status: AC
  Filled 2017-08-23: qty 2

## 2017-08-23 MED ORDER — DOCUSATE SODIUM 100 MG PO CAPS
100.0000 mg | ORAL_CAPSULE | Freq: Two times a day (BID) | ORAL | Status: DC
  Administered 2017-08-23 – 2017-08-24 (×2): 100 mg via ORAL
  Filled 2017-08-23 (×2): qty 1

## 2017-08-23 MED ORDER — PROPOFOL 500 MG/50ML IV EMUL
INTRAVENOUS | Status: AC
  Filled 2017-08-23: qty 50

## 2017-08-23 MED ORDER — MENTHOL 3 MG MT LOZG: 1.0000 | LOZENGE | OROMUCOSAL | Status: DC | PRN

## 2017-08-23 MED ORDER — BUPIVACAINE-EPINEPHRINE 0.25% -1:200000 IJ SOLN
INTRAMUSCULAR | Status: DC | PRN
  Administered 2017-08-23: 30 mL

## 2017-08-23 MED ORDER — BUPIVACAINE-EPINEPHRINE (PF) 0.25% -1:200000 IJ SOLN
INTRAMUSCULAR | Status: AC
  Filled 2017-08-23: qty 30

## 2017-08-23 MED ORDER — TIZANIDINE HCL 4 MG PO TABS: 4.0000 mg | ORAL_TABLET | Freq: Every evening | ORAL | Status: DC | PRN

## 2017-08-23 MED ORDER — FENTANYL CITRATE (PF) 100 MCG/2ML IJ SOLN
INTRAMUSCULAR | Status: AC
  Filled 2017-08-23: qty 2

## 2017-08-23 MED ORDER — SODIUM CHLORIDE 0.9 % IR SOLN
Status: DC | PRN
  Administered 2017-08-23: 3000 mL

## 2017-08-23 MED ORDER — PHENOL 1.4 % MT LIQD: 1.0000 | OROMUCOSAL | Status: DC | PRN

## 2017-08-23 MED ORDER — LIDOCAINE 2% (20 MG/ML) 5 ML SYRINGE
INTRAMUSCULAR | Status: AC
  Filled 2017-08-23: qty 5

## 2017-08-23 MED ORDER — POLYETHYLENE GLYCOL 3350 17 G PO PACK
17.0000 g | PACK | Freq: Two times a day (BID) | ORAL | Status: DC
  Administered 2017-08-24: 17 g via ORAL
  Filled 2017-08-23: qty 1

## 2017-08-23 MED ORDER — ONDANSETRON HCL 4 MG PO TABS: 4.0000 mg | ORAL_TABLET | Freq: Four times a day (QID) | ORAL | Status: DC | PRN

## 2017-08-23 MED ORDER — METOCLOPRAMIDE HCL 5 MG/ML IJ SOLN
5.0000 mg | Freq: Three times a day (TID) | INTRAMUSCULAR | Status: DC | PRN
Start: 2017-08-23 — End: 2017-08-24

## 2017-08-23 MED ORDER — OXYCODONE HCL 5 MG PO TABS
ORAL_TABLET | ORAL | Status: AC
  Filled 2017-08-23: qty 2

## 2017-08-23 MED ORDER — FENTANYL CITRATE (PF) 250 MCG/5ML IJ SOLN
INTRAMUSCULAR | Status: AC
  Filled 2017-08-23: qty 5

## 2017-08-23 MED ORDER — ONDANSETRON HCL 4 MG/2ML IJ SOLN: 4.0000 mg | Freq: Four times a day (QID) | INTRAMUSCULAR | Status: DC | PRN

## 2017-08-23 MED ORDER — DEXAMETHASONE SODIUM PHOSPHATE 10 MG/ML IJ SOLN
10.0000 mg | Freq: Three times a day (TID) | INTRAMUSCULAR | Status: DC
  Administered 2017-08-23 – 2017-08-24 (×2): 10 mg via INTRAVENOUS
  Filled 2017-08-23 (×2): qty 1

## 2017-08-23 MED ORDER — HYDROMORPHONE HCL 1 MG/ML IJ SOLN
INTRAMUSCULAR | Status: AC
Start: 2017-08-23 — End: 2017-08-24
  Filled 2017-08-23: qty 1

## 2017-08-23 MED ORDER — HYDROMORPHONE HCL 1 MG/ML IJ SOLN
1.0000 mg | INTRAMUSCULAR | Status: DC | PRN
  Administered 2017-08-23: 2 mg via INTRAVENOUS
  Administered 2017-08-23 – 2017-08-24 (×2): 1 mg via INTRAVENOUS
  Administered 2017-08-24: 2 mg via INTRAVENOUS
  Administered 2017-08-24 (×2): 1 mg via INTRAVENOUS
  Filled 2017-08-23 (×5): qty 1
  Filled 2017-08-23: qty 2
  Filled 2017-08-23: qty 1

## 2017-08-23 MED ORDER — DIPHENHYDRAMINE HCL 12.5 MG/5ML PO ELIX: 12.5000 mg | ORAL_SOLUTION | ORAL | Status: DC | PRN

## 2017-08-23 MED ORDER — ACETAMINOPHEN 650 MG RE SUPP: 650.0000 mg | Freq: Four times a day (QID) | RECTAL | Status: DC | PRN

## 2017-08-23 MED ORDER — METOCLOPRAMIDE HCL 5 MG PO TABS: 5.0000 mg | ORAL_TABLET | Freq: Three times a day (TID) | ORAL | Status: DC | PRN

## 2017-08-23 MED ORDER — FENTANYL CITRATE (PF) 100 MCG/2ML IJ SOLN
50.0000 ug | Freq: Once | INTRAMUSCULAR | Status: AC
  Administered 2017-08-23: 50 ug via INTRAVENOUS

## 2017-08-23 MED ORDER — PHENYLEPHRINE HCL 10 MG/ML IJ SOLN
INTRAVENOUS | Status: DC | PRN
  Administered 2017-08-23: 20 ug/min via INTRAVENOUS

## 2017-08-23 MED ORDER — ONDANSETRON HCL 4 MG/2ML IJ SOLN
INTRAMUSCULAR | Status: DC | PRN
Start: 2017-08-23 — End: 2017-08-23
  Administered 2017-08-23: 4 mg via INTRAVENOUS

## 2017-08-23 MED ORDER — OXYCODONE HCL 5 MG PO TABS
5.0000 mg | ORAL_TABLET | ORAL | Status: DC | PRN
  Administered 2017-08-23 (×2): 10 mg via ORAL
  Filled 2017-08-23: qty 2

## 2017-08-23 MED ORDER — FENTANYL CITRATE (PF) 100 MCG/2ML IJ SOLN
INTRAMUSCULAR | Status: DC | PRN
  Administered 2017-08-23 (×5): 50 ug via INTRAVENOUS

## 2017-08-23 MED ORDER — CEFAZOLIN SODIUM-DEXTROSE 2-4 GM/100ML-% IV SOLN
2.0000 g | Freq: Four times a day (QID) | INTRAVENOUS | Status: AC
  Administered 2017-08-23 (×2): 2 g via INTRAVENOUS
  Filled 2017-08-23 (×2): qty 100

## 2017-08-23 MED ORDER — PROPOFOL 10 MG/ML IV BOLUS
INTRAVENOUS | Status: AC
  Filled 2017-08-23: qty 20

## 2017-08-23 MED ORDER — OXYCODONE HCL 5 MG PO TABS
5.0000 mg | ORAL_TABLET | ORAL | Status: DC | PRN
Start: 2017-08-23 — End: 2017-08-24
  Administered 2017-08-24 (×5): 15 mg via ORAL
  Filled 2017-08-23 (×6): qty 3

## 2017-08-23 MED ORDER — LACTATED RINGERS IV SOLN
INTRAVENOUS | Status: DC
  Administered 2017-08-23 (×3): via INTRAVENOUS

## 2017-08-23 MED ORDER — CEFAZOLIN SODIUM-DEXTROSE 2-4 GM/100ML-% IV SOLN
2.0000 g | INTRAVENOUS | Status: AC
  Administered 2017-08-23: 2 g via INTRAVENOUS
  Filled 2017-08-23: qty 100

## 2017-08-23 MED ORDER — ENOXAPARIN SODIUM 30 MG/0.3ML ~~LOC~~ SOLN
30.0000 mg | Freq: Two times a day (BID) | SUBCUTANEOUS | Status: DC
  Administered 2017-08-24: 30 mg via SUBCUTANEOUS
  Filled 2017-08-23: qty 0.3

## 2017-08-23 MED ORDER — PHENYLEPHRINE 40 MCG/ML (10ML) SYRINGE FOR IV PUSH (FOR BLOOD PRESSURE SUPPORT)
PREFILLED_SYRINGE | INTRAVENOUS | Status: DC | PRN
  Administered 2017-08-23 (×2): 80 ug via INTRAVENOUS

## 2017-08-23 SURGICAL SUPPLY — 69 items
APL SKNCLS STERI-STRIP NONHPOA (GAUZE/BANDAGES/DRESSINGS) ×1
BANDAGE ESMARK 6X9 LF (GAUZE/BANDAGES/DRESSINGS) ×1 IMPLANT
BENZOIN TINCTURE PRP APPL 2/3 (GAUZE/BANDAGES/DRESSINGS) ×2 IMPLANT
BLADE 10 SAFETY STRL DISP (BLADE) ×2 IMPLANT
BLADE SAGITTAL 25.0X1.19X90 (BLADE) ×4 IMPLANT
BLADE SAW SGTL 13X75X1.27 (BLADE) ×2 IMPLANT
BLADE SURG 10 STRL SS (BLADE) ×4 IMPLANT
BNDG CMPR 9X6 STRL LF SNTH (GAUZE/BANDAGES/DRESSINGS) ×1
BNDG CMPR MED 15X6 ELC VLCR LF (GAUZE/BANDAGES/DRESSINGS) ×1
BNDG ELASTIC 6X15 VLCR STRL LF (GAUZE/BANDAGES/DRESSINGS) ×2 IMPLANT
BNDG ESMARK 6X9 LF (GAUZE/BANDAGES/DRESSINGS) ×2
BOWL SMART MIX CTS (DISPOSABLE) ×2 IMPLANT
CAPT KNEE TOTAL 3 ATTUNE ×1 IMPLANT
CEMENT HV SMART SET (Cement) ×4 IMPLANT
COVER SURGICAL LIGHT HANDLE (MISCELLANEOUS) ×2 IMPLANT
CUFF TOURNIQUET SINGLE 34IN LL (TOURNIQUET CUFF) ×2 IMPLANT
CUFF TOURNIQUET SINGLE 44IN (TOURNIQUET CUFF) IMPLANT
DECANTER SPIKE VIAL GLASS SM (MISCELLANEOUS) IMPLANT
DRAPE EXTREMITY T 121X128X90 (DRAPE) ×2 IMPLANT
DRAPE HALF SHEET 40X57 (DRAPES) ×4 IMPLANT
DRAPE INCISE IOBAN 66X45 STRL (DRAPES) ×1 IMPLANT
DRAPE U-SHAPE 47X51 STRL (DRAPES) ×2 IMPLANT
DRSG AQUACEL AG ADV 3.5X14 (GAUZE/BANDAGES/DRESSINGS) ×2 IMPLANT
DURAPREP 26ML APPLICATOR (WOUND CARE) ×4 IMPLANT
ELECT CAUTERY BLADE 6.4 (BLADE) ×2 IMPLANT
ELECT REM PT RETURN 9FT ADLT (ELECTROSURGICAL) ×2
ELECTRODE REM PT RTRN 9FT ADLT (ELECTROSURGICAL) ×1 IMPLANT
FACESHIELD WRAPAROUND (MASK) ×2 IMPLANT
GLOVE BIO SURGEON STRL SZ7 (GLOVE) ×2 IMPLANT
GLOVE BIOGEL PI IND STRL 7.0 (GLOVE) ×1 IMPLANT
GLOVE BIOGEL PI IND STRL 7.5 (GLOVE) ×1 IMPLANT
GLOVE BIOGEL PI INDICATOR 7.0 (GLOVE) ×1
GLOVE BIOGEL PI INDICATOR 7.5 (GLOVE) ×1
GLOVE SS BIOGEL STRL SZ 7.5 (GLOVE) ×1 IMPLANT
GLOVE SUPERSENSE BIOGEL SZ 7.5 (GLOVE) ×1
GOWN STRL REUS W/ TWL LRG LVL3 (GOWN DISPOSABLE) ×1 IMPLANT
GOWN STRL REUS W/ TWL XL LVL3 (GOWN DISPOSABLE) ×3 IMPLANT
GOWN STRL REUS W/TWL LRG LVL3 (GOWN DISPOSABLE) ×2
GOWN STRL REUS W/TWL XL LVL3 (GOWN DISPOSABLE) ×6
HANDPIECE INTERPULSE COAX TIP (DISPOSABLE) ×2
HOOD PEEL AWAY FACE SHEILD DIS (HOOD) ×4 IMPLANT
IMMOBILIZER KNEE 22 UNIV (SOFTGOODS) ×2 IMPLANT
KIT BASIN OR (CUSTOM PROCEDURE TRAY) ×2 IMPLANT
KIT ROOM TURNOVER OR (KITS) ×2 IMPLANT
MANIFOLD NEPTUNE II (INSTRUMENTS) ×2 IMPLANT
MARKER SKIN DUAL TIP RULER LAB (MISCELLANEOUS) ×4 IMPLANT
NDL 18GX1X1/2 (RX/OR ONLY) (NEEDLE) ×1 IMPLANT
NEEDLE 18GX1X1/2 (RX/OR ONLY) (NEEDLE) ×2 IMPLANT
NS IRRIG 1000ML POUR BTL (IV SOLUTION) ×2 IMPLANT
PACK TOTAL JOINT (CUSTOM PROCEDURE TRAY) ×2 IMPLANT
PAD ARMBOARD 7.5X6 YLW CONV (MISCELLANEOUS) ×4 IMPLANT
SET HNDPC FAN SPRY TIP SCT (DISPOSABLE) ×1 IMPLANT
STRIP CLOSURE SKIN 1/2X4 (GAUZE/BANDAGES/DRESSINGS) ×2 IMPLANT
SUCTION FRAZIER HANDLE 10FR (MISCELLANEOUS) ×1
SUCTION TUBE FRAZIER 10FR DISP (MISCELLANEOUS) ×1 IMPLANT
SUT MNCRL AB 3-0 PS2 18 (SUTURE) ×2 IMPLANT
SUT VIC AB 0 CT1 27 (SUTURE) ×6
SUT VIC AB 0 CT1 27XBRD ANBCTR (SUTURE) ×3 IMPLANT
SUT VIC AB 1 CT1 27 (SUTURE) ×2
SUT VIC AB 1 CT1 27XBRD ANBCTR (SUTURE) ×1 IMPLANT
SUT VIC AB 2-0 CT1 27 (SUTURE) ×4
SUT VIC AB 2-0 CT1 TAPERPNT 27 (SUTURE) ×2 IMPLANT
SYR 30ML LL (SYRINGE) ×2 IMPLANT
TOWEL OR 17X24 6PK STRL BLUE (TOWEL DISPOSABLE) ×2 IMPLANT
TOWEL OR 17X26 10 PK STRL BLUE (TOWEL DISPOSABLE) ×2 IMPLANT
TRAY CATH 16FR W/PLASTIC CATH (SET/KITS/TRAYS/PACK) IMPLANT
TRAY FOLEY CATH SILVER 16FR (SET/KITS/TRAYS/PACK) ×1 IMPLANT
TUBE CONNECTING 12X1/4 (SUCTIONS) ×2 IMPLANT
YANKAUER SUCT BULB TIP NO VENT (SUCTIONS) ×2 IMPLANT

## 2017-08-23 NOTE — Transfer of Care (Signed)
Immediate Anesthesia Transfer of Care Note  Patient: [name]  Procedure(s) Performed: Procedure(s): LEFT TOTAL KNEE ARTHROPLASTY (Left)  Patient Location: PACU  Anesthesia Type:MAC, Regional and Spinal  Level of Consciousness: awake, alert , oriented and sedated  Airway & Oxygen Therapy: Patient Spontanous Breathing and Patient connected to nasal cannula oxygen  Post-op Assessment: Report given to RN, Post -op Vital signs reviewed and stable and Patient moving all extremities  Post vital signs: Reviewed and stable  Last Vitals:  Vitals:   08/23/17 1040 08/23/17 1409  BP: 139/78 105/72  Pulse:  73  Resp:  15  Temp:  36.5 C  SpO2:  95%    Last Pain:  Vitals:   08/23/17 0936  TempSrc:   PainSc: 10-Worst pain ever         Complications: No apparent anesthesia complications

## 2017-08-23 NOTE — Anesthesia Procedure Notes (Signed)
Anesthesia Regional Block: Adductor canal block   Pre-Anesthetic Checklist: ,, timeout performed, Correct Patient, Correct Site, Correct Laterality, Correct Procedure, Correct Position, site marked, Risks and benefits discussed, pre-op evaluation,  At surgeon's request and post-op pain management  Laterality: Left  Prep: chloraprep       Needles:   Needle Type: Echogenic Needle     Needle Length: 9cm  Needle Gauge: 21     Additional Needles:   Procedures: ultrasound guided,,,,,,,,  Narrative:  Start time: 08/23/2017 10:30 AM End time: 08/23/2017 10:36 AM Injection made incrementally with aspirations every 5 mL. Anesthesiologist: Cristela Blue

## 2017-08-23 NOTE — Anesthesia Procedure Notes (Signed)
Spinal  Patient location during procedure: OR Start time: 08/23/2017 11:38 AM End time: 08/23/2017 11:43 AM Staffing Anesthesiologist: Leslye Peer E Performed: anesthesiologist  Preanesthetic Checklist Completed: patient identified, surgical consent, pre-op evaluation, timeout performed, IV checked, risks and benefits discussed and monitors and equipment checked Spinal Block Patient position: sitting Prep: DuraPrep Patient monitoring: heart rate, cardiac monitor, continuous pulse ox and blood pressure Approach: midline Location: L2-3 Injection technique: single-shot Needle Needle type: Pencan  Needle gauge: 24 G Additional Notes Functioning IV was confirmed and monitors were applied. Sterile prep and drape, including hand hygiene, mask, and sterile gloves were used. The patient was positioned and the spine was prepped. The skin was anesthetized with lidocaine. Free flow of clear CSF was obtained prior to injecting local anesthetic into the CSF. The spinal needle aspirated freely following injection. The needle was carefully withdrawn. The patient tolerated the procedure well. Consent was obtained prior to the procedure with all questions answered and concerns addressed. Risks including, but not limited to, bleeding, infection, nerve damage, paralysis, failed block, inadequate analgesia, allergic reaction, high spinal, itching, and headache were discussed and the patient wished to proceed.  Leslye Peer, MD

## 2017-08-23 NOTE — Anesthesia Procedure Notes (Signed)
Procedure Name: MAC Date/Time: 08/23/2017 11:45 AM Performed by: Scheryl Darter Pre-anesthesia Checklist: Patient being monitored, Patient identified, Emergency Drugs available, Suction available and Timeout performed Oxygen Delivery Method: Simple face mask Placement Confirmation: positive ETCO2

## 2017-08-23 NOTE — Anesthesia Postprocedure Evaluation (Signed)
Anesthesia Post Note  Patient: [name]  Procedure(s) Performed: Procedure(s) (LRB): LEFT TOTAL KNEE ARTHROPLASTY (Left)     Patient location during evaluation: PACU Anesthesia Type: Regional Level of consciousness: oriented and awake and alert Pain management: pain level controlled Vital Signs Assessment: post-procedure vital signs reviewed and stable Respiratory status: spontaneous breathing, respiratory function stable and patient connected to nasal cannula oxygen Cardiovascular status: blood pressure returned to baseline and stable Postop Assessment: no headache and no backache Anesthetic complications: no    Last Vitals:  Vitals:   08/23/17 1500 08/23/17 1509  BP:  110/68  Pulse: 66 62  Resp: 17 13  Temp:    SpO2: 97% 97%    Last Pain:  Vitals:   08/23/17 1500  TempSrc:   PainSc: 0-No pain                 Beryle Lathe

## 2017-08-23 NOTE — Progress Notes (Signed)
Orthopedic Tech Progress Note Patient Details:  KHAMAR STANDARD Apr 20, 1961 [id number] Pt exceeds weight limit for overhead frame. CPM Left Knee CPM Left Knee: On Left Knee Flexion (Degrees): 90 Left Knee Extension (Degrees): 0  Ortho Devices Ortho Device/Splint Location: footsie roll Ortho Device/Splint Interventions: Ordered, Application, Adjustment   Jennye Moccasin 08/23/2017, 2:52 PM

## 2017-08-23 NOTE — Op Note (Signed)
MRN:     [id number] DOB/AGE:    [birth year] / 56 y.o.       OPERATIVE REPORT    DATE OF PROCEDURE:  08/23/2017       PREOPERATIVE DIAGNOSIS:   Primary localized OA left knee      Estimated body mass index is 37.77 kg/m as calculated from the following:   Height as of this encounter: 5\' 8"  (1.727 m).   Weight as of this encounter: 112.7 kg (248 lb 6.4 oz).                                                        POSTOPERATIVE DIAGNOSIS:   same                                                                 PROCEDURE:  Procedure(s): LEFT TOTAL KNEE ARTHROPLASTY Using Depuy Attune RP implants #6 Femur, #6Tibia, 8mm  RP bearing, 35 Patella     SURGEON: Jaedah Lords A    ASSISTANT:  Kirstin Shepperson PA-C   (Present and scrubbed throughout the case, critical for assistance with exposure, retraction, instrumentation, and closure.)         ANESTHESIA: Spinal with Adductor Nerve Block     TOURNIQUET TIME:   COMPLICATIONS:  None     SPECIMENS: None   INDICATIONS FOR PROCEDURE: The patient has  djd left knee, valgus deformities, XR shows bone on bone arthritis. Patient has failed all conservative measures including anti-inflammatory medicines, narcotics, attempts at  exercise and weight loss, cortisone injections and viscosupplementation.  Risks and benefits of surgery have been discussed, questions answered.   DESCRIPTION OF PROCEDURE: The patient identified by armband, received  right femoral nerve block and IV antibiotics, in the holding area at [facility]. Patient taken to the operating room, appropriate anesthetic  monitors were attached Spinal anesthesia induced with  the patient in supine position, Foley catheter was inserted. Tourniquet  applied high to the operative thigh. Lateral post and foot positioner  applied to the table, the lower extremity was then prepped and draped  in usual sterile fashion from the ankle to the tourniquet. Time-out procedure was performed.  The limb was wrapped with an Esmarch bandage and the tourniquet inflated to 365 mmHg. We began the operation by making the anterior midline incision starting at handbreadth above the patella going over the patella 1 cm medial to and  4 cm distal to the tibial tubercle. Small bleeders in the skin and the  subcutaneous tissue identified and cauterized. Transverse retinaculum was incised and reflected medially and a medial parapatellar arthrotomy was accomplished. the patella was everted and theprepatellar fat pad resected. The superficial medial collateral  ligament was then elevated from anterior to posterior along the proximal  flare of the tibia and anterior half of the menisci resected. The knee was hyperflexed exposing bone on bone arthritis. Peripheral and notch osteophytes as well as the cruciate ligaments were then resected. We continued to  work our way around posteriorly along the proximal tibia, and externally  rotated the tibia subluxing it out from underneath  the femur. A McHale  retractor was placed through the notch and a lateral Hohmann retractor  placed, and we then drilled through the proximal tibia in line with the  axis of the tibia followed by an intramedullary guide rod and 2-degree  posterior slope cutting guide. The tibial cutting guide was pinned into place  allowing resection of 6 mm of bone medially and about 4 mm of bone  laterally because of her valgus deformity. Satisfied with the tibial resection, we then  entered the distal femur 2 mm anterior to the PCL origin with the  intramedullary guide rod and applied the distal femoral cutting guide  set at 56mm, with 5 degrees of valgus. This was pinned along the  epicondylar axis. At this point, the distal femoral cut was accomplished without difficulty. We then sized for a #6 femoral component and pinned the guide in 3 degrees of external rotation.The chamfer cutting guide was pinned into place. The anterior, posterior, and  chamfer cuts were accomplished without difficulty followed by  the  RP box cutting guide and the box cut. We also removed posterior osteophytes from the posterior femoral condyles. At this  time, the knee was brought into full extension. We checked our  extension and flexion gaps and found them symmetric at 51mm.  The patella thickness measured at 25 mm. We set the cutting guide at 15 and removed the posterior 9.5-10 mm  of the patella sized for 35 button and drilled the lollipop. The knee  was then once again hyperflexed exposing the proximal tibia. We sized for a #6 tibial base plate, applied the smokestack and the conical reamer followed by the the Delta fin keel punch. We then hammered into place the  RP trial femoral component, inserted a 1 trial bearing, trial patellar button, and took the knee through range of motion from 0-130 degrees. No thumb pressure was required for patellar  tracking. At this point, all trial components were removed, a double batch of DePuy HV cement  was mixed and applied to all bony metallic mating surfaces except for the posterior condyles of the femur itself. In order, we  hammered into place the tibial tray and removed excess cement, the femoral component and removed excess cement, a 11mm  RP bearing  was inserted, and the knee brought to full extension with compression.  The patellar button was clamped into place, and excess cement  removed. While the cement cured the wound was irrigated out with normal saline solution pulse lavage.. Ligament stability and patellar tracking were checked and found to be excellent.. The parapatellar arthrotomy was closed with  #1 Vicryl suture. The subcutaneous tissue with 0 and 2-0 undyed  Vicryl suture, and 4-0 Monocryl.. A dressing of Aquaseal,  4 x 4, dressing sponges, Webril, and Ace wrap applied. Needle and sponge count were correct times 2.The patient awakened, extubated, and taken to recovery room without difficulty. Vascular  status was normal, pulses 2+ and symmetric.   Boneta Standre A 08/23/2017, 1:41 PM

## 2017-08-23 NOTE — Interval H&P Note (Signed)
History and Physical Interval Note:  08/23/2017 11:25 AM  [name]  has presented today for surgery, with the diagnosis of djd left knee  The various methods of treatment have been discussed with the patient and family. After consideration of risks, benefits and other options for treatment, the patient has consented to  Procedure(s): LEFT TOTAL KNEE ARTHROPLASTY (Left) as a surgical intervention .  The patient's history has been reviewed, patient examined, no change in status, stable for surgery.  I have reviewed the patient's chart and labs.  Questions were answered to the patient's satisfaction.     Salvatore Marvel A

## 2017-08-23 NOTE — Progress Notes (Signed)
Pt admitted to the unit from pacu; pt A&O x4; IV intact and transfusing; foley intact and unclamped; left knee incision with compression wrap clean, dry and intact; no stain or drainage noted; knee immobilizer on; VSS; pt oriented to the unit and room; fall/safety precaution and prevention education completed with pt and spouse at bedside; both voices understanding and denies any questions. Pt report decreased sensation numbness and pain to LLE. Call light within reach and will closely monitor pt. Dionne Bucy RN

## 2017-08-23 NOTE — Anesthesia Procedure Notes (Signed)
Spinal

## 2017-08-23 NOTE — Anesthesia Preprocedure Evaluation (Addendum)
Anesthesia Evaluation  Patient identified by MRN, date of birth, ID band Patient awake    Reviewed: Allergy & Precautions, H&P , Patient's Chart, lab work & pertinent test results  Airway Mallampati: II  TM Distance: >3 FB Neck ROM: full    Dental no notable dental hx.    Pulmonary former smoker,    Pulmonary exam normal breath sounds clear to auscultation       Cardiovascular Exercise Tolerance: Good  Rhythm:regular Rate:Normal     Neuro/Psych    GI/Hepatic   Endo/Other    Renal/GU      Musculoskeletal   Abdominal   Peds  Hematology   Anesthesia Other Findings No anti coags for intermittent AFib  EF of 65% documented via echocardiogram on 07/12/2004."  (Copy of 10/19/06 Cardiolite done at Dr. York Spaniel office received and did show apical ischemia, EF 56%.following him since 12/2005. Patient did have multiple surgeries for sigmoid diverticulitis within the 12 months following his abnormal stress test. underwent ACDF (2011) and lumbar fusion (2015). 2011 and 2015 notes by Dr. Donnie Aho note patient's history of abnormal stress test.)   Reproductive/Obstetrics                            Anesthesia Physical Anesthesia Plan  ASA: III  Anesthesia Plan: Spinal   Post-op Pain Management:    Induction:   PONV Risk Score and Plan:   Airway Management Planned:   Additional Equipment:   Intra-op Plan:   Post-operative Plan:   Informed Consent: I have reviewed the patients History and Physical, chart, labs and discussed the procedure including the risks, benefits and alternatives for the proposed anesthesia with the patient or authorized representative who has indicated his/her understanding and acceptance.     Plan Discussed with:   Anesthesia Plan Comments: (  )       Anesthesia Quick Evaluation

## 2017-08-24 ENCOUNTER — Encounter (HOSPITAL_COMMUNITY): Payer: Self-pay | Admitting: Orthopedic Surgery

## 2017-08-24 LAB — BASIC METABOLIC PANEL
Anion gap: 7 (ref 5–15)
BUN: 10 mg/dL (ref 6–20)
CO2: 26 mmol/L (ref 22–32)
Calcium: 8.7 mg/dL — ABNORMAL LOW (ref 8.9–10.3)
Chloride: 103 mmol/L (ref 101–111)
Creatinine, Ser: 0.78 mg/dL (ref 0.61–1.24)
GFR calc Af Amer: >60 mL/min (ref >60)
GFR calc non Af Amer: >60 mL/min (ref >60)
Glucose, Bld: 223 mg/dL — ABNORMAL HIGH (ref 65–99)
Potassium: 4.9 mmol/L (ref 3.5–5.1)
Sodium: 136 mmol/L (ref 135–145)

## 2017-08-24 LAB — CBC
HCT: 39.2 % (ref 39.0–52.0)
Hemoglobin: 12.6 g/dL — ABNORMAL LOW (ref 13.0–17.0)
MCH: 29.9 pg (ref 26.0–34.0)
MCHC: 32.1 g/dL (ref 30.0–36.0)
MCV: 93.1 fL (ref 78.0–100.0)
Platelets: 190 10*3/uL (ref 150–400)
RBC: 4.21 MIL/uL — ABNORMAL LOW (ref 4.22–5.81)
RDW: 14.2 % (ref 11.5–15.5)
WBC: 10.9 10*3/uL — ABNORMAL HIGH (ref 4.0–10.5)

## 2017-08-24 MED ORDER — ASPIRIN 325 MG PO TABS
325.0000 mg | ORAL_TABLET | Freq: Every day | ORAL | Status: DC
  Administered 2017-08-24: 325 mg via ORAL
  Filled 2017-08-24: qty 1

## 2017-08-24 MED ORDER — OXYCODONE HCL ER 20 MG PO T12A
20.0000 mg | EXTENDED_RELEASE_TABLET | Freq: Two times a day (BID) | ORAL | Status: DC
  Administered 2017-08-24: 20 mg via ORAL
  Filled 2017-08-24: qty 1

## 2017-08-24 MED ORDER — OXYCODONE HCL 5 MG PO TABS: 5.0000 mg | ORAL_TABLET | ORAL | 0 refills | Status: DC | PRN

## 2017-08-24 MED ORDER — METHOCARBAMOL 500 MG PO TABS: 500.0000 mg | ORAL_TABLET | Freq: Four times a day (QID) | ORAL | Status: DC | PRN

## 2017-08-24 MED ORDER — ACETAMINOPHEN 500 MG PO TABS: 1000.0000 mg | ORAL_TABLET | Freq: Three times a day (TID) | ORAL | 0 refills | Status: DC | PRN

## 2017-08-24 MED ORDER — DICLOFENAC SODIUM 75 MG PO TBEC
75.0000 mg | DELAYED_RELEASE_TABLET | Freq: Two times a day (BID) | ORAL | Status: DC
  Administered 2017-08-24: 75 mg via ORAL
  Filled 2017-08-24 (×2): qty 1

## 2017-08-24 MED ORDER — POLYETHYLENE GLYCOL 3350 17 G PO PACK: PACK | ORAL | 0 refills | Status: DC

## 2017-08-24 MED ORDER — DOCUSATE SODIUM 100 MG PO CAPS: ORAL_CAPSULE | ORAL | 0 refills | Status: DC

## 2017-08-24 MED ORDER — OXYCODONE HCL ER 20 MG PO T12A: EXTENDED_RELEASE_TABLET | ORAL | 0 refills | Status: DC

## 2017-08-24 MED ORDER — ASPIRIN 325 MG PO TABS: ORAL_TABLET | ORAL | 0 refills | Status: DC

## 2017-08-24 NOTE — Care Management Note (Signed)
Case Management Note  Patient Details  Name: WHITTEN ARNEY MRN: [id number] Date of Birth: 1961-11-16  Subjective/Objective:  56 yr old gentleman s/p left total knee arthroplasty.                 Action/Plan:  Case manager spoke with patient concerning discharge plan and DME . Patient was preoperatively setup with [facility], no changes. He has RW and 3in1.   Expected Discharge Date:  08/24/17               Expected Discharge Plan:  Home w Home Health Services  In-House Referral:  NA  Discharge planning Services  CM Consult  Post Acute Care Choice:  NA, Home Health Choice offered to:  Patient  DME Arranged:  CPM (Has rolling walker) DME Agency:  [employer]  HH Arranged:  PT HH Agency:  [facility] (formerly [facility])  Status of Service:  Completed, signed off  If discussed at [employer], dates discussed:    Additional Comments:  Durenda Guthrie, RN 08/24/2017, 3:01 PM

## 2017-08-24 NOTE — Progress Notes (Signed)
Orthopedic Tech Progress Note Patient Details:  [name] December 22, 1961 [id number]  Patient ID: [name], male   DOB: 08-08-1961, 56 y.o.   MRN: [id number] Pt will call when ready for cpm   Trinna Post 08/24/2017, 6:33 AM

## 2017-08-24 NOTE — Progress Notes (Signed)
Inpatient Diabetes Program Recommendations  AACE/ADA: New Consensus Statement on Inpatient Glycemic Control (2015)  Target Ranges:  Prepandial:   less than 140 mg/dL      Peak postprandial:   less than 180 mg/dL (1-2 hours)      Critically ill patients:  140 - 180 mg/dL   Results for [name], [name] (MRN [id number]) as of 08/24/2017 10:01  Ref. Range 08/24/2017 02:42  Glucose Latest Ref Range: 65 - 99 mg/dL 409 (H)    Review of Glycemic Control  Diabetes history: No Outpatient Diabetes medications: NA Current orders for Inpatient glycemic control: None  Inpatient Diabetes Program Recommendations: Correction (SSI): While inpatient and ordered steroids, please consider ordering CBGs with Novolog correction ACHS. HgbA1C: Please consider ordering an A1C to evaluate glycemic control over the past 2-3 months.   NOTE: Patient is currently ordered Decadron 10 mg Q8H which is likely cause of hyperglycemia.  Thanks, Orlando Penner, RN, MSN, CDE Diabetes Coordinator Inpatient Diabetes Program [phone] (Team Pager from 8am to 5pm)

## 2017-08-24 NOTE — Plan of Care (Signed)
Problem: Pain Managment: Goal: General experience of comfort will improve Outcome: Progressing Patient voices understanding of pain scale and calls for pain medication when needed   

## 2017-08-24 NOTE — Progress Notes (Signed)
Subjective: 1 Day Post-Op Procedure(s) (LRB): LEFT TOTAL KNEE ARTHROPLASTY (Left) Patient reports pain as 9 on 0-10 scale.    Objective: Vital signs in last 24 hours: Temp:  [97.2 F (36.2 C)-99.1 F (37.3 C)] 99.1 F (37.3 C) (08/30 0414) Pulse Rate:  [55-99] 99 (08/30 0414) Resp:  [12-24] 18 (08/30 0414) BP: (96-141)/(60-80) 129/67 (08/30 0414) SpO2:  [91 %-99 %] 97 % (08/30 0414)  Intake/Output from previous day: 08/29 0701 - 08/30 0700 In: 1910 [P.O.:360; I.V.:1450; IV Piggyback:100] Out: 1850 [Urine:1800; Blood:50] Intake/Output this shift: No intake/output data recorded.   Recent Labs  08/23/17 1833 08/24/17 0242  HGB 13.6 12.6*    Recent Labs  08/23/17 1833 08/24/17 0242  WBC 12.5* 10.9*  RBC 4.40 4.21*  HCT 40.9 39.2  PLT 181 190    Recent Labs  08/23/17 1833 08/24/17 0242  NA  --  136  K  --  4.9  CL  --  103  CO2  --  26  BUN  --  10  CREATININE 0.77 0.78  GLUCOSE  --  223*  CALCIUM  --  8.7*   No results for input(s): LABPT, INR in the last 72 hours.  ABD soft Neurovascular intact Sensation intact distally Dorsiflexion/Plantar flexion intact Incision: dressing C/D/I  Assessment/Plan: 1 Day Post-Op Procedure(s) (LRB): LEFT TOTAL KNEE ARTHROPLASTY (Left)  Principal Problem:   Primary localized osteoarthritis of left knee Active Problems:   Spinal stenosis   Atrial fibrillation [facility])   Diverticular disease Patient frustrated that his pain med is not schedule but is PRN.  Overnight ortho called doubled all pain meds and added Robaxin.  Patient declined his CPM because he wanted to eat breakfast.  He states he is behind here at the hospital because his pain is not controlled.  With this in mind I have added Oxycontin 20mg  in hopes of weaning him off of his IV Dilaudid for discharge tomorrow.   Advance diet Up with therapy Plan for discharge tomorrow Discharge home with home health  Pascal Lux 08/24/2017, 9:58 AM

## 2017-08-24 NOTE — Discharge Instructions (Signed)
Total Knee Replacement, Care After Refer to this sheet in the next few weeks. These instructions provide you with information about caring for yourself after your procedure. Your health care provider may also give you more specific instructions. Your treatment has been planned according to current medical practices, but problems sometimes occur. Call your health care provider if you have any problems or questions after your procedure. What can I expect after the procedure? After the procedure, it is common to have:  Pain and swelling.  A small amount of blood or clear fluid coming from your incision.  Limited range of motion.  Follow these instructions at home: Medicines  Take over-the-counter and prescription medicines only as told by your health care provider.  If you were prescribed an antibiotic medicine, take it as told by your health care provider. Do not stop taking the antibiotic even if you start to feel better.  If you were prescribed a blood thinner (anticoagulant), take it as told by your health care provider. If you have a splint or brace:  Wear the immobilizer as told by your health care provider. Remove it only as told by your health care provider.  Loosen the immobilizer if your toes tingle, become numb, or turn cold and blue.  Do not let your immobilizer get wet if it is not waterproof.  Keep the immobilizer clean. Bathing   Do not take baths, swim, or use a hot tub until your health care provider approves. Ask your health care provider if you can take showers. You may only be allowed to take sponge baths for bathing.  If you have an immobilizer that is not waterproof, cover it with a watertight covering when you take a bath or shower.  Keep your bandage (dressing) dry until your health care provider says it can be removed. Incision care and drain care  Check your incision area and drain site every day for signs of infection. Check for: ? More redness,  swelling, or pain. ? More fluid or blood. ? Warmth. ? Pus or a bad smell.  Follow instructions from your health care provider about how to take care of your incision. Make sure you: ? Wash your hands with soap and water before you change your dressing. If soap and water are not available, use hand sanitizer. ? Change your dressing as told by your health care provider. ? Leave stitches (sutures), skin glue, or adhesive strips in place. These skin closures may need to stay in place for 2 weeks or longer. If adhesive strip edges start to loosen and curl up, you may trim the loose edges. Do not remove adhesive strips completely unless your health care provider tells you to do that.  If you have a drain, follow instructions from your health care provider about caring for it. Do not remove the drain tube or any dressings around the tube opening unless your health care provider approves. Managing pain, stiffness, and swelling  If directed, put ice on your knee. ? Put ice in a plastic bag. ? Place a towel between your skin and the bag. ? Leave the ice on for 20 minutes, 2-3 times per day.  If directed, apply heat to the affected area as often as told by your health care provider. Use the heat source that your health care provider recommends, such as a moist heat pack or a heating pad. ? Place a towel between your skin and the heat source. ? Leave the heat on for 20-30 minutes. ?   Remove the heat if your skin turns bright red. This is especially important if you are unable to feel pain, heat, or cold. You may have a greater risk of getting burned.  Move your toes often to avoid stiffness and to lessen swelling.  Raise (elevate) your knee above the level of your heart while you are sitting or lying down.  Wear elastic knee support for as long as told by your health care provider. Driving   Do not drive until your health care provider approves. Ask your health care provider when it is safe to  drive if you have an immobilizer on your knee.  Do not drive or operate heavy machinery while taking prescription pain medicine.  Do not drive for 24 hours if you received a sedative. Activity  Do not lift anything that is heavier than 10 lb (4.5 kg) until your health care provider approves.  Do not play contact sports until your health care provider approves.  Avoid high-impact activities, including running, jumping rope, and jumping jacks.  Avoid sitting for a long time without moving. Get up and move around at least every few hours.  If physical therapy was prescribed, do exercises as told by your health care provider.  Return to your normal activities as told by your health care provider. Ask your health care provider what activities are safe for you. Safety  Do not use your leg to support your body weight until your health care provider approves. Use crutches or a walker as told by your health care provider. General instructions   Do not have any dental work done for at least 3 months after your surgery. When you do have dental work done, tell your dentist about your joint replacement.  Do not use any tobacco products, such as cigarettes, chewing tobacco, or e-cigarettes. If you need help quitting, ask your health care provider.  Wear compression stockings as told by your health care provider. These stockings help to prevent blood clots and reduce swelling in your legs.  If you have been sent home with a continuous passive motion machine, use it as told by your health care provider.  Drink enough fluid to keep your urine clear or pale yellow.  If you have been instructed to lose weight, follow instructions from your health care provider about how to do this safely.  Keep all follow-up visits as told by your health care provider. This is important. Contact a health care provider if:  You have more redness, swelling, or pain around your incision or drain.  You have more  fluid or blood coming from your incision or drain.  Your incision or drain site feels warm to the touch.  You have pus or a bad smell coming from your incision or drain.  You have a fever.  Your incision breaks open after your health care provider removes your sutures, skin glue, or adhesive tape.  Your prosthesis feels loose.  You have knee pain that does not go away. Get help right away if:  You have a rash.  You have pain or swelling in your calf or thigh.  You have shortness of breath or difficulty breathing.  You have chest pain.  Your range of motion in your knee is getting worse. This information is not intended to replace advice given to you by your health care provider. Make sure you discuss any questions you have with your health care provider. Document Released: 07/01/2005 Document Revised: 08/15/2016 Document Reviewed: 11/18/2015 Elsevier Interactive Patient   Education  2017 [employer]  

## 2017-08-24 NOTE — Evaluation (Signed)
Physical Therapy Evaluation Patient Details Name: [name] MRN: [id number] DOB: [birth year] Today's Date: 08/24/2017   History of Present Illness  Pt is a 56 y.o. male now s/p L TKA on 08/23/17. Pertinent PMH includes a-fib, arthritis, diverticulitis, cervical fusion.   Clinical Impression  Pt presents with pain and an overall decrease in functional mobility secondary to above. PTA, pt indep with mobility and lives with wife who can provide 24/7 supervision if needed. Educ on precautions, positioning, therex, and importance of mobility. Today, pt transferred and amb 100' with RW and min guard. Pt would benefit from continued acute PT services to maximize functional mobility and independence prior to d/c with HHPT.     Follow Up Recommendations DC plan and follow up therapy as arranged by surgeon;Home health PT    Equipment Recommendations  None recommended by PT (Owns all necessary equipment)    Recommendations for Other Services OT consult     Precautions / Restrictions Precautions Precautions: Knee Precaution Booklet Issued: No Precaution Comments: Precautions reviewed Restrictions Weight Bearing Restrictions: Yes LLE Weight Bearing: Weight bearing as tolerated      Mobility  Bed Mobility Overal bed mobility: Needs Assistance Bed Mobility: Supine to Sit     Supine to sit: Supervision;HOB elevated     General bed mobility comments: Able to don shorts sitting EOB with supervision  Transfers Overall transfer level: Needs assistance Equipment used: Rolling walker (2 wheeled) Transfers: Sit to/from Stand Sit to Stand: Min guard         General transfer comment: Min guard for safety first time standing.   Ambulation/Gait Ambulation/Gait assistance: Min guard Ambulation Distance (Feet): 100 Feet Assistive device: Rolling walker (2 wheeled) Gait Pattern/deviations: Step-to pattern;Step-through pattern;Decreased stride length;Antalgic;Decreased weight shift to  left Gait velocity: Decreased Gait velocity interpretation: <1.8 ft/sec, indicative of risk for recurrent falls General Gait Details: Amb with RW and min guard for safety. Cues for step-through and heel-to-toe gait pattern; further mobility limited by pain and fatigue.  Stairs            Wheelchair Mobility    Modified Rankin (Stroke Patients Only)       Balance Overall balance assessment: Needs assistance Sitting-balance support: No upper extremity supported;Feet supported Sitting balance-Leahy Scale: Good     Standing balance support: Bilateral upper extremity supported;Single extremity supported;During functional activity Standing balance-Leahy Scale: Fair                               Pertinent Vitals/Pain Pain Assessment: 0-10 Pain Score: 7  Pain Location: L knee Pain Descriptors / Indicators: Discomfort;Guarding;Sore Pain Intervention(s): Limited activity within patient's tolerance;Monitored during session;Patient requesting pain meds-RN notified    Home Living Family/patient expects to be discharged to:: Private residence Living Arrangements: Spouse/significant other Available Help at Discharge: Family;Available 24 hours/day Type of Home: House Home Access: Level entry     Home Layout: Two level;Able to live on main level with bedroom/bathroom Home Equipment: Dan Humphreys - 2 wheels;Bedside commode      Prior Function Level of Independence: Independent         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        Extremity/Trunk Assessment   Upper Extremity Assessment Upper Extremity Assessment: Overall WFL for tasks assessed    Lower Extremity Assessment Lower Extremity Assessment: LLE deficits/detail LLE Deficits / Details: Hip flex 3/5, knee flex/ext 3/5 LLE: Unable to fully assess due  to pain       Communication   Communication: No difficulties  Cognition Arousal/Alertness: Awake/alert Behavior During Therapy: WFL for  tasks assessed/performed Overall Cognitive Status: Within Functional Limits for tasks assessed                                        General Comments      Exercises Total Joint Exercises Ankle Circles/Pumps: AROM;Both;10 reps Straight Leg Raises: AROM;Left;5 reps;Supine Long Arc Quad: AROM;Left;10 reps;Seated Knee Flexion: AAROM;Left;10 reps;Seated (with washcloth & assist from RLE) Goniometric ROM: L knee flexion grossly 10-90'   Assessment/Plan    PT Assessment Patient needs continued PT services  PT Problem List Decreased strength;Decreased range of motion;Decreased activity tolerance;Decreased balance;Decreased mobility;Decreased knowledge of use of DME;Pain       PT Treatment Interventions DME instruction;Gait training;Stair training;Functional mobility training;Therapeutic activities;Therapeutic exercise;Balance training;Patient/family education    PT Goals (Current goals can be found in the Care Plan section)  Acute Rehab PT Goals Patient Stated Goal: Decreased pain to return home PT Goal Formulation: With patient Time For Goal Achievement: 09/07/17 Potential to Achieve Goals: Good    Frequency 7X/week   Barriers to discharge        Co-evaluation               AM-PAC PT "6 Clicks" Daily Activity  Outcome Measure Difficulty turning over in bed (including adjusting bedclothes, sheets and blankets)?: A Little Difficulty moving from lying on back to sitting on the side of the bed? : A Little Difficulty sitting down on and standing up from a chair with arms (e.g., wheelchair, bedside commode, etc,.)?: A Little Help needed moving to and from a bed to chair (including a wheelchair)?: A Little Help needed walking in hospital room?: A Little Help needed climbing 3-5 steps with a railing? : A Little 6 Click Score: 18    End of Session Equipment Utilized During Treatment: Gait belt Activity Tolerance: Patient tolerated treatment well Patient  left: in chair;with call bell/phone within reach Nurse Communication: Mobility status;Patient requests pain meds;Other (comment) (request for maintenance for [facility] unit) PT Visit Diagnosis: Other abnormalities of gait and mobility (R26.89);Pain Pain - Right/Left: Left Pain - part of body: Knee    Time: 7096-4383 PT Time Calculation (min) (ACUTE ONLY): 33 min   Charges:   PT Evaluation $PT Eval Moderate Complexity: 1 Mod PT Treatments $Therapeutic Exercise: 8-22 mins   PT G Codes:       Ina Homes, PT, DPT Acute Rehab Services  Pager: [phone]  Malachy Chamber 08/24/2017, 9:28 AM

## 2017-08-24 NOTE — Discharge Summary (Signed)
Patient ID: [name] MRN: [id number] DOB/AGE: Apr 04, 1961 56 y.o.  Admit date: 08/23/2017 Discharge date: 08/24/2017  Admission Diagnoses:  Principal Problem:   Primary localized osteoarthritis of left knee Active Problems:   Spinal stenosis   Atrial fibrillation [facility])   Diverticular disease   Discharge Diagnoses:  Same  Past Medical History:  Diagnosis Date  . Arthritis   . Atrial fibrillation (HCC)    diagnosed 05/2004 (PAF/afib-flutter)  . Diverticular disease   . Dysrhythmia    PAF  . Primary localized osteoarthritis of left knee     Surgeries: Procedure(s): LEFT TOTAL KNEE ARTHROPLASTY on 08/23/2017   Consultants:   Discharged Condition: Improved  Hospital Course: [name] is an 56 y.o. male who was admitted 08/23/2017 for operative treatment ofPrimary localized osteoarthritis of left knee. Patient has severe unremitting pain that affects sleep, daily activities, and work/hobbies. After pre-op clearance the patient was taken to the operating room on 08/23/2017 and underwent  Procedure(s): LEFT TOTAL KNEE ARTHROPLASTY.    Patient was given perioperative antibiotics: Anti-infectives    Start     Dose/Rate Route Frequency Ordered Stop   08/23/17 1700  ceFAZolin (ANCEF) IVPB 2g/100 mL premix     2 g 200 mL/hr over 30 Minutes Intravenous Every 6 hours 08/23/17 1650 08/23/17 2357   08/23/17 0911  ceFAZolin (ANCEF) IVPB 2g/100 mL premix     2 g 200 mL/hr over 30 Minutes Intravenous On call to O.R. 08/23/17 0911 08/23/17 1138       Patient was given sequential compression devices, early ambulation, and chemoprophylaxis to prevent DVT.  Patient benefited maximally from hospital stay and there were no complications.    Recent vital signs: Patient Vitals for the past 24 hrs:  BP Temp Temp src Pulse Resp SpO2  08/24/17 1425 138/87 98.6 F (37 C) Oral 68 18 99 %  08/24/17 0414 129/67 99.1 F (37.3 C) Oral 99 18 97 %  08/23/17 2049 117/64 99 F (37.2 C)  Oral 86 18 94 %  08/23/17 1654 (!) 141/80 (!) 97.5 F (36.4 C) Oral 82 (!) 22 97 %  08/23/17 1624 114/70 - - 61 (!) 24 99 %  08/23/17 1615 - - - 66 18 99 %  08/23/17 1609 96/80 - - 60 (!) 24 99 %  08/23/17 1600 - (!) 97.2 F (36.2 C) - 65 15 99 %  08/23/17 1554 115/75 - - 62 15 99 %  08/23/17 1545 - - - 61 13 98 %  08/23/17 1539 108/71 - - 63 16 99 %  08/23/17 1530 - - - 70 14 97 %  08/23/17 1524 113/68 - - 64 13 97 %  08/23/17 1515 - - - (!) 55 13 97 %  08/23/17 1509 110/68 - - 62 13 97 %  08/23/17 1500 - - - 66 17 97 %  08/23/17 1454 107/72 - - 68 15 97 %  08/23/17 1445 - - - 65 (!) 21 97 %     Recent laboratory studies:  Recent Labs  08/23/17 1833 08/24/17 0242  WBC 12.5* 10.9*  HGB 13.6 12.6*  HCT 40.9 39.2  PLT 181 190  NA  --  136  K  --  4.9  CL  --  103  CO2  --  26  BUN  --  10  CREATININE 0.77 0.78  GLUCOSE  --  223*  CALCIUM  --  8.7*     Discharge Medications:   Allergies as of 08/24/2017  Reactions   Erythromycin Other (See Comments)   Abdominal cramps   Mobic [meloxicam] Other (See Comments)   Lower back and kidney pain    Morphine And Related Other (See Comments)   Abdominal cramping  Migraine headaches   Prednisone Hives, Anxiety   Makes him anxious   Pregabalin Other (See Comments)   Dry mouth and out-of-body feeling   Red Dye Rash, Other (See Comments)   Turns red Feels like skin on fire   Z-pak [azithromycin] Nausea Only   Severe stomach cramps   Penicillins Rash   Has patient had a PCN reaction causing immediate rash, facial/tongue/throat swelling, SOB or lightheadedness with hypotension: No Has patient had a PCN reaction causing severe rash involving mucus membranes or skin necrosis: No Has patient had a PCN reaction that required hospitalization: No Has patient had a PCN reaction occurring within the last 10 years: No If all of the above answers are "NO", then may proceed with Cephalosporin use.      Medication List    STOP  taking these medications   tiZANidine 4 MG tablet Commonly known as:  ZANAFLEX   traMADol 50 MG tablet Commonly known as:  ULTRAM   TURMERIC PO     TAKE these medications   acetaminophen 500 MG tablet Commonly known as:  TYLENOL Take 2 tablets (1,000 mg total) by mouth every 8 (eight) hours as needed for mild pain or moderate pain. What changed:  when to take this   aspirin 325 MG tablet 1 tab a day for the next 30 days to prevent blood clots   atenolol 50 MG tablet Commonly known as:  TENORMIN Take 50 mg by mouth daily.   diclofenac 75 MG EC tablet Commonly known as:  VOLTAREN Take 75 mg by mouth 2 (two) times daily.   diphenhydrAMINE 25 MG tablet Commonly known as:  BENADRYL Take 25 mg by mouth every 6 (six) hours as needed for allergies or sleep.   docusate sodium 100 MG capsule Commonly known as:  COLACE 1 tab 2 times a day while on narcotics.  STOOL SOFTENERTakes Equate Stool  Softner What changed:  how much to take  how to take this  when to take this  reasons to take this  additional instructions   Glucosamine HCl 1500 MG Tabs Take 1,500 mg by mouth daily.   GREEN TEA EXTRACT PO Take 500 mg by mouth daily.   methocarbamol 500 MG tablet Commonly known as:  ROBAXIN Take 500 mg by mouth daily as needed for muscle spasms.   milk thistle 175 MG tablet Take 175 mg by mouth daily.   multivitamin with minerals Tabs tablet Take 1 tablet by mouth daily. For Men's   oxyCODONE 20 mg 12 hr tablet Commonly known as:  OXYCONTIN 1 po AT 10 AM AND 10 PM.  LONG ACTING PAIN MEDS What changed:  how much to take  how to take this  when to take this  additional instructions   oxyCODONE 5 MG immediate release tablet Commonly known as:  Oxy IR/ROXICODONE Take 1-3 tablets (5-15 mg total) by mouth every 4 (four) hours as needed for breakthrough pain. Must wait at least 3 hrs after oxycontin dose prior to taking this What changed:  You were already taking a  medication with the same name, and this prescription was added. Make sure you understand how and when to take each.   polyethylene glycol packet Commonly known as:  MIRALAX / GLYCOLAX 17grams in 6 oz  of water twice a day until bowel movement.  LAXITIVE.  Restart if two days since last bowel movement            Discharge Care Instructions        Start     Ordered   08/24/17 0000  acetaminophen (TYLENOL) 500 MG tablet  Every 8 hours PRN    Question:  Supervising Provider  Answer:  Salvatore Marvel   08/24/17 1440   08/24/17 0000  aspirin 325 MG tablet    Question:  Supervising Provider  Answer:  Salvatore Marvel   08/24/17 1440   08/24/17 0000  docusate sodium (COLACE) 100 MG capsule    Question:  Supervising Provider  Answer:  Salvatore Marvel   08/24/17 1440   08/24/17 0000  oxyCODONE (OXYCONTIN) 20 mg 12 hr tablet    Question:  Supervising Provider  Answer:  Salvatore Marvel   08/24/17 1440   08/24/17 0000  oxyCODONE (OXY IR/ROXICODONE) 5 MG immediate release tablet  Every 4 hours PRN    Question:  Supervising Provider  Answer:  Salvatore Marvel   08/24/17 1440   08/24/17 0000  polyethylene glycol (MIRALAX / Ethelene Hal) packet    Question:  Supervising Provider  Answer:  Salvatore Marvel   08/24/17 1440   08/24/17 0000  Call MD / Call 911    Comments:  If you experience chest pain or shortness of breath, CALL 911 and be transported to the hospital emergency room.  If you develope a fever above 101 F, pus (white drainage) or increased drainage or redness at the wound, or calf pain, call your surgeon's office.   08/24/17 1440   08/24/17 0000  Discharge instructions    Comments:  INSTRUCTIONS AFTER JOINT REPLACEMENT   Remove items at home which could result in a fall. This includes throw rugs or furniture in walking pathways ICE to the affected joint every three hours while awake for 30 minutes at a time, for at least the first 3-5 days, and then as needed for pain and swelling.  Continue to  use ice for pain and swelling. You may notice swelling that will progress down to the foot and ankle.  This is normal after surgery.  Elevate your leg when you are not up walking on it.   Continue to use the breathing machine you got in the hospital (incentive spirometer) which will help keep your temperature down.  It is common for your temperature to cycle up and down following surgery, especially at night when you are not up moving around and exerting yourself.  The breathing machine keeps your lungs expanded and your temperature down.   DIET:  As you were doing prior to hospitalization, we recommend a well-balanced diet.  DRESSING / WOUND CARE / SHOWERING  Keep the surgical dressing until follow up.  The dressing is water proof, so you can shower without any extra covering.  IF THE DRESSING FALLS OFF or the wound gets wet inside, change the dressing with sterile gauze.  Please use good hand washing techniques before changing the dressing.  Do not use any lotions or creams on the incision until instructed by your surgeon.    ACTIVITY  Increase activity slowly as tolerated, but follow the weight bearing instructions below.   No driving for 6 weeks or until further direction given by your physician.  You cannot drive while taking narcotics.  No lifting or carrying greater than 10 lbs. until further directed by your surgeon. Avoid periods of  inactivity such as sitting longer than an hour when not asleep. This helps prevent blood clots.  You may return to work once you are authorized by your doctor.     WEIGHT BEARING   Weight bearing as tolerated with assist device (walker, cane, etc) as directed, use it as long as suggested by your surgeon or therapist, typically at least 2-3 weeks.   EXERCISES  Results after joint replacement surgery are often greatly improved when you follow the exercise, range of motion and muscle strengthening exercises prescribed by your doctor. Safety measures are  also important to protect the joint from further injury. Any time any of these exercises cause you to have increased pain or swelling, decrease what you are doing until you are comfortable again and then slowly increase them. If you have problems or questions, call your caregiver or physical therapist for advice.   Rehabilitation is important following a joint replacement. After just a few days of immobilization, the muscles of the leg can become weakened and shrink (atrophy).  These exercises are designed to build up the tone and strength of the thigh and leg muscles and to improve motion. Often times heat used for twenty to thirty minutes before working out will loosen up your tissues and help with improving the range of motion but do not use heat for the first two weeks following surgery (sometimes heat can increase post-operative swelling).   These exercises can be done on a training (exercise) mat, on the floor, on a table or on a bed. Use whatever works the best and is most comfortable for you.    Use music or television while you are exercising so that the exercises are a pleasant break in your day. This will make your life better with the exercises acting as a break in your routine that you can look forward to.   Perform all exercises about fifteen times, three times per day or as directed.  You should exercise both the operative leg and the other leg as well.   Exercises include:  Quad Sets - Tighten up the muscle on the front of the thigh (Quad) and hold for 5-10 seconds.   Straight Leg Raises - With your knee straight (if you were given a brace, keep it on), lift the leg to 60 degrees, hold for 3 seconds, and slowly lower the leg.  Perform this exercise against resistance later as your leg gets stronger.  Leg Slides: Lying on your back, slowly slide your foot toward your buttocks, bending your knee up off the floor (only go as far as is comfortable). Then slowly slide your foot back down until  your leg is flat on the floor again.  Angel Wings: Lying on your back spread your legs to the side as far apart as you can without causing discomfort.  Hamstring Strength:  Lying on your back, push your heel against the floor with your leg straight by tightening up the muscles of your buttocks.  Repeat, but this time bend your knee to a comfortable angle, and push your heel against the floor.  You may put a pillow under the heel to make it more comfortable if necessary.   A rehabilitation program following joint replacement surgery can speed recovery and prevent re-injury in the future due to weakened muscles. Contact your doctor or a physical therapist for more information on knee rehabilitation.    CONSTIPATION  Constipation is defined medically as fewer than three stools per week and severe constipation  as less than one stool per week.  Even if you have a regular bowel pattern at home, your normal regimen is likely to be disrupted due to multiple reasons following surgery.  Combination of anesthesia, postoperative narcotics, change in appetite and fluid intake all can affect your bowels.   YOU MUST use at least one of the following options; they are listed in order of increasing strength to get the job done.  They are all available over the counter, and you may need to use some, POSSIBLY even all of these options:    Drink plenty of fluids (prune juice may be helpful) and high fiber foods Colace 100 mg by mouth twice a day  Senokot for constipation as directed and as needed Dulcolax (bisacodyl), take with full glass of water  Miralax (polyethylene glycol) once or twice a day as needed.  If you have tried all these things and are unable to have a bowel movement in the first 3-4 days after surgery call either your surgeon or your primary doctor.    If you experience loose stools or diarrhea, hold the medications until you stool forms back up.  If your symptoms do not get better within 1 week or  if they get worse, check with your doctor.  If you experience "the worst abdominal pain ever" or develop nausea or vomiting, please contact the office immediately for further recommendations for treatment.   ITCHING:  If you experience itching with your medications, try taking only a single pain pill, or even half a pain pill at a time.  You can also use Benadryl over the counter for itching or also to help with sleep.   TED HOSE STOCKINGS:  Use stockings on both legs until for at least 2 weeks or as directed by physician office. They may be removed at night for sleeping.  MEDICATIONS:  See your medication summary on the "After Visit Summary" that nursing will review with you.  You may have some home medications which will be placed on hold until you complete the course of blood thinner medication.  It is important for you to complete the blood thinner medication as prescribed.  PRECAUTIONS:  If you experience chest pain or shortness of breath - call 911 immediately for transfer to the hospital emergency department.   If you develop a fever greater that 101 F, purulent drainage from wound, increased redness or drainage from wound, foul odor from the wound/dressing, or calf pain - CONTACT YOUR SURGEON.                                                   FOLLOW-UP APPOINTMENTS:  If you do not already have a post-op appointment, please call the office for an appointment to be seen by your surgeon.  Guidelines for how soon to be seen are listed in your "After Visit Summary", but are typically between 1-4 weeks after surgery.  OTHER INSTRUCTIONS:   Knee Replacement:  Do not place pillow under knee, focus on keeping the knee straight while resting. CPM instructions: 0-90 degrees, 2 hours in the morning, 2 hours in the afternoon, and 2 hours in the evening. Place foam block, curve side up under heel at all times except when in CPM or when walking.  DO NOT modify, tear, cut, or change the foam block in any  way.  MAKE SURE YOU:  Understand these instructions.  Get help right away if you are not doing well or get worse.    Thank you for letting us be a part of your medical care team.  It is a privilege we respect greatly.  We hope these instructions will help you stay on track for a fast and full recovery!   08/24/17 1440   08/24/17 0000  Diet - low sodium heart healthy     08/24/17 1440   08/24/17 0000  Constipation Prevention    Comments:  Drink plenty of fluids.  Prune juice may be helpful.  You may use a stool softener, such as Colace (over the counter) 100 mg twice a day.  Use MiraLax (over the counter) for constipation as needed.   08/24/17 1440   08/24/17 0000  Increase activity slowly as tolerated     08/24/17 1440   08/24/17 0000  Patient may shower    Comments:  Aquacel dressing is water proof    Wash over it and the whole leg with soap and water at the end of your shower   08/24/17 1440   08/24/17 0000  CPM    Comments:  Continuous passive motion machine (CPM):      Use the CPM from 0 to 90 for 6 hours per day.       You may break it up into 2 or 3 sessions per day.      Use CPM for 2 weeks or until you are told to stop.   08/24/17 1440   08/24/17 0000  TED hose    Comments:  Use stockings (TED hose) for 2 weeks on both leg(s).  You may remove them at night for sleeping.   08/24/17 1440   08/24/17 0000  Change dressing    Comments:  DO NOT REMOVE BANDAGE OVER SURGICAL INCISION.  WASH WHOLE LEG INCLUDING OVER THE WATERPROOF BANDAGE WITH SOAP AND WATER EVERY DAY.   08/24/17 1440   08/24/17 0000  Do not put a pillow under the knee. Place it under the heel.    Comments:  Place gray foam block, curve side up under heel at all times except when in CPM or when walking.  DO NOT modify, tear, cut, or change in any way the gray foam block.   08/24/17 1440      Diagnostic Studies: No results found.  Disposition: 06-Home-Health Care Svc  Discharge Instructions    CPM    Complete  by:  As directed    Continuous passive motion machine (CPM):      Use the CPM from 0 to 90 for 6 hours per day.       You may break it up into 2 or 3 sessions per day.      Use CPM for 2 weeks or until you are told to stop.   Call MD / Call 911    Complete by:  As directed    If you experience chest pain or shortness of breath, CALL 911 and be transported to the hospital emergency room.  If you develope a fever above 101 F, pus (white drainage) or increased drainage or redness at the wound, or calf pain, call your surgeon's office.   Change dressing    Complete by:  As directed    DO NOT REMOVE BANDAGE OVER SURGICAL INCISION.  WASH WHOLE LEG INCLUDING OVER THE WATERPROOF BANDAGE WITH SOAP AND WATER EVERY DAY.   Constipation Prevention  Complete by:  As directed    Drink plenty of fluids.  Prune juice may be helpful.  You may use a stool softener, such as Colace (over the counter) 100 mg twice a day.  Use MiraLax (over the counter) for constipation as needed.   Diet - low sodium heart healthy    Complete by:  As directed    Discharge instructions    Complete by:  As directed    INSTRUCTIONS AFTER JOINT REPLACEMENT   Remove items at home which could result in a fall. This includes throw rugs or furniture in walking pathways ICE to the affected joint every three hours while awake for 30 minutes at a time, for at least the first 3-5 days, and then as needed for pain and swelling.  Continue to use ice for pain and swelling. You may notice swelling that will progress down to the foot and ankle.  This is normal after surgery.  Elevate your leg when you are not up walking on it.   Continue to use the breathing machine you got in the hospital (incentive spirometer) which will help keep your temperature down.  It is common for your temperature to cycle up and down following surgery, especially at night when you are not up moving around and exerting yourself.  The breathing machine keeps your lungs  expanded and your temperature down.   DIET:  As you were doing prior to hospitalization, we recommend a well-balanced diet.  DRESSING / WOUND CARE / SHOWERING  Keep the surgical dressing until follow up.  The dressing is water proof, so you can shower without any extra covering.  IF THE DRESSING FALLS OFF or the wound gets wet inside, change the dressing with sterile gauze.  Please use good hand washing techniques before changing the dressing.  Do not use any lotions or creams on the incision until instructed by your surgeon.    ACTIVITY  Increase activity slowly as tolerated, but follow the weight bearing instructions below.   No driving for 6 weeks or until further direction given by your physician.  You cannot drive while taking narcotics.  No lifting or carrying greater than 10 lbs. until further directed by your surgeon. Avoid periods of inactivity such as sitting longer than an hour when not asleep. This helps prevent blood clots.  You may return to work once you are authorized by your doctor.     WEIGHT BEARING   Weight bearing as tolerated with assist device (walker, cane, etc) as directed, use it as long as suggested by your surgeon or therapist, typically at least 2-3 weeks.   EXERCISES  Results after joint replacement surgery are often greatly improved when you follow the exercise, range of motion and muscle strengthening exercises prescribed by your doctor. Safety measures are also important to protect the joint from further injury. Any time any of these exercises cause you to have increased pain or swelling, decrease what you are doing until you are comfortable again and then slowly increase them. If you have problems or questions, call your caregiver or physical therapist for advice.   Rehabilitation is important following a joint replacement. After just a few days of immobilization, the muscles of the leg can become weakened and shrink (atrophy).  These exercises are  designed to build up the tone and strength of the thigh and leg muscles and to improve motion. Often times heat used for twenty to thirty minutes before working out will loosen up your tissues and  help with improving the range of motion but do not use heat for the first two weeks following surgery (sometimes heat can increase post-operative swelling).   These exercises can be done on a training (exercise) mat, on the floor, on a table or on a bed. Use whatever works the best and is most comfortable for you.    Use music or television while you are exercising so that the exercises are a pleasant break in your day. This will make your life better with the exercises acting as a break in your routine that you can look forward to.   Perform all exercises about fifteen times, three times per day or as directed.  You should exercise both the operative leg and the other leg as well.   Exercises include:  Quad Sets - Tighten up the muscle on the front of the thigh (Quad) and hold for 5-10 seconds.   Straight Leg Raises - With your knee straight (if you were given a brace, keep it on), lift the leg to 60 degrees, hold for 3 seconds, and slowly lower the leg.  Perform this exercise against resistance later as your leg gets stronger.  Leg Slides: Lying on your back, slowly slide your foot toward your buttocks, bending your knee up off the floor (only go as far as is comfortable). Then slowly slide your foot back down until your leg is flat on the floor again.  Angel Wings: Lying on your back spread your legs to the side as far apart as you can without causing discomfort.  Hamstring Strength:  Lying on your back, push your heel against the floor with your leg straight by tightening up the muscles of your buttocks.  Repeat, but this time bend your knee to a comfortable angle, and push your heel against the floor.  You may put a pillow under the heel to make it more comfortable if necessary.   A rehabilitation program  following joint replacement surgery can speed recovery and prevent re-injury in the future due to weakened muscles. Contact your doctor or a physical therapist for more information on knee rehabilitation.    CONSTIPATION  Constipation is defined medically as fewer than three stools per week and severe constipation as less than one stool per week.  Even if you have a regular bowel pattern at home, your normal regimen is likely to be disrupted due to multiple reasons following surgery.  Combination of anesthesia, postoperative narcotics, change in appetite and fluid intake all can affect your bowels.   YOU MUST use at least one of the following options; they are listed in order of increasing strength to get the job done.  They are all available over the counter, and you may need to use some, POSSIBLY even all of these options:    Drink plenty of fluids (prune juice may be helpful) and high fiber foods Colace 100 mg by mouth twice a day  Senokot for constipation as directed and as needed Dulcolax (bisacodyl), take with full glass of water  Miralax (polyethylene glycol) once or twice a day as needed.  If you have tried all these things and are unable to have a bowel movement in the first 3-4 days after surgery call either your surgeon or your primary doctor.    If you experience loose stools or diarrhea, hold the medications until you stool forms back up.  If your symptoms do not get better within 1 week or if they get worse, check with your doctor.  If you experience "the worst abdominal pain ever" or develop nausea or vomiting, please contact the office immediately for further recommendations for treatment.   ITCHING:  If you experience itching with your medications, try taking only a single pain pill, or even half a pain pill at a time.  You can also use Benadryl over the counter for itching or also to help with sleep.   TED HOSE STOCKINGS:  Use stockings on both legs until for at least 2 weeks  or as directed by physician office. They may be removed at night for sleeping.  MEDICATIONS:  See your medication summary on the "After Visit Summary" that nursing will review with you.  You may have some home medications which will be placed on hold until you complete the course of blood thinner medication.  It is important for you to complete the blood thinner medication as prescribed.  PRECAUTIONS:  If you experience chest pain or shortness of breath - call 911 immediately for transfer to the hospital emergency department.   If you develop a fever greater that 101 F, purulent drainage from wound, increased redness or drainage from wound, foul odor from the wound/dressing, or calf pain - CONTACT YOUR SURGEON.                                                   FOLLOW-UP APPOINTMENTS:  If you do not already have a post-op appointment, please call the office for an appointment to be seen by your surgeon.  Guidelines for how soon to be seen are listed in your "After Visit Summary", but are typically between 1-4 weeks after surgery.  OTHER INSTRUCTIONS:   Knee Replacement:  Do not place pillow under knee, focus on keeping the knee straight while resting. CPM instructions: 0-90 degrees, 2 hours in the morning, 2 hours in the afternoon, and 2 hours in the evening. Place foam block, curve side up under heel at all times except when in CPM or when walking.  DO NOT modify, tear, cut, or change the foam block in any way.  MAKE SURE YOU:  Understand these instructions.  Get help right away if you are not doing well or get worse.    Thank you for letting us be a part of your medical care team.  It is a privilege we respect greatly.  We hope these instructions will help you stay on track for a fast and full recovery!   Do not put a pillow under the knee. Place it under the heel.    Complete by:  As directed    Place gray foam block, curve side up under heel at all times except when in CPM or when walking.   DO NOT modify, tear, cut, or change in any way the gray foam block.   Increase activity slowly as tolerated    Complete by:  As directed    Patient may shower    Complete by:  As directed    Aquacel dressing is water proof    Wash over it and the whole leg with soap and water at the end of your shower   TED hose    Complete by:  As directed    Use stockings (TED hose) for 2 weeks on both leg(s).  You may remove them at night for sleeping.  Follow-up Information    Salvatore Marvel, MD Follow up on 09/04/2017.   Specialty:  Orthopedic Surgery Why:  appt time 3:30 pm Contact information: [address] [place] [address] [phone]            Signed: Pascal Lux 08/24/2017, 2:41 PM

## 2017-08-24 NOTE — Evaluation (Signed)
Occupational Therapy Evaluation and Discharge Patient Details Name: [name] MRN: [id number] DOB: Mar 01, 1961 Today's Date: 08/24/2017    History of Present Illness Pt is a 56 y.o. male now s/p L TKA on 08/23/17. Pertinent PMH includes a-fib, arthritis, diverticulitis, cervical fusion.    Clinical Impression   Pt reports he was independent with ADL PTA. Currently pt requires supervision for seated ADL and functional mobility but requires min assist for LB ADL. All knee, safety, and ADL education completed with pt and wife. Pt planning to d/c home with 24/7 supervision from family. No further acute OT needs identified; signing off at this time. Please re-consult if needs change. Thank you for this referral.    Follow Up Recommendations  No OT follow up    Equipment Recommendations  Toilet riser    Recommendations for Other Services       Precautions / Restrictions Precautions Precautions: Knee Precaution Booklet Issued: No Precaution Comments: Educated pt on no pillow under knee and use of bone foam Restrictions Weight Bearing Restrictions: Yes LLE Weight Bearing: Weight bearing as tolerated      Mobility Bed Mobility      General bed mobility comments: Pt OOB in chair upon arrival  Transfers Overall transfer level: Needs assistance Equipment used: Rolling walker (2 wheeled) Transfers: Sit to/from Stand Sit to Stand: Supervision         General transfer comment: supervision for safety. good hand placement and technique    Balance Overall balance assessment: Needs assistance Sitting-balance support: Feet supported;No upper extremity supported Sitting balance-Leahy Scale: Good     Standing balance support: No upper extremity supported Standing balance-Leahy Scale: Fair Standing balance comment: static standing                           ADL either performed or assessed with clinical judgement   ADL Overall ADL's : Needs  assistance/impaired Eating/Feeding: Set up;Sitting   Grooming: Supervision/safety   Upper Body Bathing: Set up;Supervision/ safety;Sitting   Lower Body Bathing: Minimal assistance;Sit to/from stand   Upper Body Dressing : Set up;Supervision/safety;Sitting   Lower Body Dressing: Minimal assistance;Sit to/from stand Lower Body Dressing Details (indicate cue type and reason): to don L sock. Educated on L leg into clothing first. wife to assist as needed Toilet Transfer: Supervision/safety;Ambulation;RW Toilet Transfer Details (indicate cue type and reason): simulated by sit to stand from chair with functional mobility in room         Functional mobility during ADLs: Supervision/safety;Rolling walker       Vision         Perception     Praxis      Pertinent Vitals/Pain Pain Assessment: 0-10 Pain Score: 7  Pain Location: LLE muscles Pain Descriptors / Indicators: Aching;Tightness Pain Intervention(s): Monitored during session;Repositioned;Patient requesting pain meds-RN notified;RN gave pain meds during session;Ice applied     Hand Dominance     Extremity/Trunk Assessment Upper Extremity Assessment Upper Extremity Assessment: Overall WFL for tasks assessed   Lower Extremity Assessment Lower Extremity Assessment: Defer to PT evaluation    Cervical / Trunk Assessment Cervical / Trunk Assessment: Normal   Communication Communication Communication: No difficulties   Cognition Arousal/Alertness: Awake/alert Behavior During Therapy: WFL for tasks assessed/performed Overall Cognitive Status: Within Functional Limits for tasks assessed  General Comments       Exercises    Shoulder Instructions      Home Living Family/patient expects to be discharged to:: Private residence Living Arrangements: Spouse/significant other Available Help at Discharge: Family;Available 24 hours/day Type of Home: House Home Access:  Level entry     Home Layout: Two level;Able to live on main level with bedroom/bathroom     Bathroom Shower/Tub: Producer, television/film/video: Standard     Home Equipment: Environmental consultant - 2 wheels          Prior Functioning/Environment Level of Independence: Independent        Comments: Retired from work        OT Problem List:        OT Treatment/Interventions:      OT Goals(Current goals can be found in the care plan section) Acute Rehab OT Goals Patient Stated Goal: Decreased pain to return home OT Goal Formulation: All assessment and education complete, DC therapy  OT Frequency:     Barriers to D/C:            Co-evaluation              AM-PAC PT "6 Clicks" Daily Activity     Outcome Measure Help from another person eating meals?: None Help from another person taking care of personal grooming?: A Little Help from another person toileting, which includes using toliet, bedpan, or urinal?: A Little Help from another person bathing (including washing, rinsing, drying)?: A Little Help from another person to put on and taking off regular upper body clothing?: None Help from another person to put on and taking off regular lower body clothing?: A Little 6 Click Score: 20   End of Session Equipment Utilized During Treatment: Rolling walker CPM Left Knee CPM Left Knee: Off Nurse Communication: Mobility status;Other (comment) (pt requesting muscle relaxer)  Activity Tolerance: Patient tolerated treatment well Patient left: in chair;with call bell/phone within reach;with family/visitor present  OT Visit Diagnosis: Other abnormalities of gait and mobility (R26.89);Pain Pain - Right/Left: Left Pain - part of body: Leg                Time: 8657-8469 OT Time Calculation (min): 26 min Charges:  OT General Charges $OT Visit: 1 Visit OT Evaluation $OT Eval Moderate Complexity: 1 Mod OT Treatments $Self Care/Home Management : 8-22 mins G-Codes:     Demarri Elie  A. Brett Albino, M.S., OTR/L Pager: [phone]  Gaye Alken 08/24/2017, 12:02 PM

## 2017-08-24 NOTE — Progress Notes (Addendum)
Physical Therapy Treatment Patient Details Name: [name] MRN: [id number] DOB: 1961-01-08 Today's Date: 08/24/2017    History of Present Illness Pt is a 56 y.o. male now s/p L TKA on 08/23/17. Pertinent PMH includes a-fib, arthritis, diverticulitis, cervical fusion.    PT Comments    Pt progressing well with mobility. Mod indep with RW for transfers, amb 250', and ascend/descend stairs. Reviewed therex, precautions, positioning, CPM use, and importance of mobility. Wife present throughout session. Encouraged pt to continue with therex and amb in hallway using RW. All education completed; pt has met acute PT goals. Still feel pt would benefit from HHPT at d/c. Will d/c acute PT.   Follow Up Recommendations  DC plan and follow up therapy as arranged by surgeon;Home health PT     Equipment Recommendations  None recommended by PT (owns all necessary DME)    Recommendations for Other Services OT consult     Precautions / Restrictions Precautions Precautions: Knee Precaution Booklet Issued: No Precaution Comments: Educated pt on no pillow under knee and use of bone foam Restrictions Weight Bearing Restrictions: Yes LLE Weight Bearing: Weight bearing as tolerated    Mobility  Bed Mobility Overal bed mobility: Needs Assistance Bed Mobility: Supine to Sit     Supine to sit: Supervision;HOB elevated     General bed mobility comments: Pt OOB in chair upon arrival  Transfers Overall transfer level: Modified independent Equipment used: Rolling walker (2 wheeled) Transfers: Sit to/from Stand Sit to Stand: Modified independent (Device/Increase time)         General transfer comment: Good hand placement and technique with RW  Ambulation/Gait Ambulation/Gait assistance: Modified independent (Device/Increase time) Ambulation Distance (Feet): 250 Feet Assistive device: Rolling walker (2 wheeled) Gait Pattern/deviations: Step-through pattern;Antalgic Gait velocity:  Decreased Gait velocity interpretation: <1.8 ft/sec, indicative of risk for recurrent falls General Gait Details: Cues for upright posture with amb, but pt reports this is baseline   Stairs Stairs: Yes   Stair Management: Two rails;Alternating pattern;Forwards Number of Stairs: 5 General stair comments: Educ to wife and husband for technique  Wheelchair Mobility    Modified Rankin (Stroke Patients Only)       Balance Overall balance assessment: Needs assistance Sitting-balance support: Feet supported;No upper extremity supported Sitting balance-Leahy Scale: Good     Standing balance support: No upper extremity supported Standing balance-Leahy Scale: Fair Standing balance comment: static standing                            Cognition Arousal/Alertness: Awake/alert Behavior During Therapy: WFL for tasks assessed/performed Overall Cognitive Status: Within Functional Limits for tasks assessed                                        Exercises Total Joint Exercises Ankle Circles/Pumps: AROM;Both;10 reps Straight Leg Raises: AROM;Left;5 reps;Supine Long Arc Quad: AROM;Left;10 reps;Seated Knee Flexion: AAROM;Left;10 reps;Seated (with washcloth & assist from RLE) Goniometric ROM: L knee flexion grossly 10-90'    General Comments        Pertinent Vitals/Pain Pain Assessment: Faces Pain Score: 7  Faces Pain Scale: Hurts little more Pain Location: LLE muscles Pain Descriptors / Indicators: Aching;Tightness Pain Intervention(s): Limited activity within patient's tolerance;Monitored during session;Repositioned    Home Living Family/patient expects to be discharged to:: Private residence Living Arrangements: Spouse/significant other Available Help at Discharge: Family;Available 24  hours/day Type of Home: House Home Access: Level entry   Home Layout: Two level;Able to live on main level with bedroom/bathroom Home Equipment: Walker - 2 wheels       Prior Function Level of Independence: Independent      Comments: Retired from work   PT Goals (current goals can now be found in the care plan section) Acute Rehab PT Goals Patient Stated Goal: Decreased pain to return home PT Goal Formulation: With patient Time For Goal Achievement: 09/07/17 Potential to Achieve Goals: Good Progress towards PT goals: Goals met/education completed, patient discharged from PT    Frequency    7X/week      PT Plan Current plan remains appropriate    Co-evaluation              AM-PAC PT "6 Clicks" Daily Activity  Outcome Measure  Difficulty turning over in bed (including adjusting bedclothes, sheets and blankets)?: None Difficulty moving from lying on back to sitting on the side of the bed? : None Difficulty sitting down on and standing up from a chair with arms (e.g., wheelchair, bedside commode, etc,.)?: None Help needed moving to and from a bed to chair (including a wheelchair)?: None Help needed walking in hospital room?: None Help needed climbing 3-5 steps with a railing? : None 6 Click Score: 24    End of Session Equipment Utilized During Treatment: Gait belt Activity Tolerance: Patient tolerated treatment well Patient left: in chair;with call bell/phone within reach;with family/visitor present Nurse Communication: Mobility status PT Visit Diagnosis: Other abnormalities of gait and mobility (R26.89);Pain Pain - Right/Left: Left Pain - part of body: Knee     Time: 7341-9379 PT Time Calculation (min) (ACUTE ONLY): 15 min  Charges:  $Gait Training: 8-22 mins                   G Codes:      Mabeline Caras, PT, DPT Acute Rehab Services  Pager: [place] 08/24/2017, 1:04 PM

## 2017-10-30 ENCOUNTER — Ambulatory Visit (INDEPENDENT_AMBULATORY_CARE_PROVIDER_SITE_OTHER): Payer: 59 | Admitting: Internal Medicine

## 2017-10-30 VITALS — BP 98/60 | HR 77 | Ht 69.0 in | Wt 251.8 lb

## 2017-10-30 DIAGNOSIS — R002 Palpitations: Secondary | ICD-10-CM | POA: Diagnosis not present

## 2017-10-30 DIAGNOSIS — E663 Overweight: Secondary | ICD-10-CM | POA: Diagnosis not present

## 2017-10-30 DIAGNOSIS — I482 Chronic atrial fibrillation, unspecified: Secondary | ICD-10-CM

## 2017-10-30 DIAGNOSIS — I480 Paroxysmal atrial fibrillation: Secondary | ICD-10-CM

## 2017-10-30 MED ORDER — ASPIRIN 325 MG PO TABS: ORAL_TABLET | ORAL | 0 refills | Status: DC

## 2017-10-30 MED ORDER — DILTIAZEM HCL 30 MG PO TABS: 30.0000 mg | ORAL_TABLET | Freq: Four times a day (QID) | ORAL | 1 refills | Status: DC | PRN

## 2017-10-30 NOTE — Patient Instructions (Signed)
Medication Instructions:  Your physician has recommended you make the following change in your medication:   1.) Start Cardizem 30mg  every 6 hours by mouth as needed for palpitations  2.) Decrease ASA to 81 mg   -- If you need a refill on your cardiac medications before your next appointment, please call your pharmacy. --  Labwork: None ordered  Testing/Procedures: None ordered  Follow-Up: Your physician wants you to follow-up in: 1 year with Dr. [name].  You will receive a reminder letter in the mail two months in advance. If you don't receive a letter, please call our office to schedule the follow-up appointment.  Thank you for choosing [facility]!!   Sigurd Sos, RN [phone]  Any Other Special Instructions Will Be Listed Below (If Applicable).

## 2017-10-30 NOTE — Progress Notes (Signed)
Electrophysiology Office Note   Date:  10/30/2017   ID:  [name], DOB [birth year], MRN [id number]  PCP:  [name], MD  Cardiologist:  Dr Donnie Aho Primary Electrophysiologist: Hillis Range, MD    Chief Complaint  Patient presents with  . Atrial Fibrillation     History of Present Illness: [name] is a 56 y.o. male who presents today for electrophysiology evaluation.   The patient is referred by Dr Cliffton Asters for EP consultation regarding his atrial fibrillation.  The patient reports having palpitations since his 67s.  He carries a diagnosis of atrial fibrillation and atrial flutter.  He also has very frequent PACs on his ekg.   He reports that his palpitations are typically short lived (30 seconds to several minutes) and occur in the setting of stress, poor sleep, or pain.  He also finds that heavy caffeine or drinking a very cold beverage really quickly will cause him to have an episode.  He finds that by breathing slowly and deeply that he can typically terminate events.  He is active.   Today, he denies symptoms of chest pain, shortness of breath, orthopnea, PND, lower extremity edema, claudication, dizziness, presyncope, syncope, bleeding, or neurologic sequela. The patient is tolerating medications without difficulties and is otherwise without complaint today.    Past Medical History:  Diagnosis Date  . Arthritis   . Atrial flutter (HCC)    diagnosed 2005  . Depression   . Diverticular disease   . GERD (gastroesophageal reflux disease)   . Obesity   . Paroxysmal atrial fibrillation (HCC)   . Primary localized osteoarthritis of left knee    Past Surgical History:  Procedure Laterality Date  . COLON SURGERY  12/07, 5/08, 10/08  . COLONOSCOPY    . COLOSTOMY  2007  . COLOSTOMY REVERSAL    . EYE SURGERY Bilateral    cataracts  . INCISIONAL HERNIA REPAIR  2008   with mesh  . KNEE ARTHROSCOPY Left 5/10   x2  . MULTIPLE TOOTH EXTRACTIONS    . neck fusion   5/11  . REPLANTATION THUMB Right   . TONSILLECTOMY    . TOTAL KNEE ARTHROPLASTY Left 08/23/2017  . WISDOM TOOTH EXTRACTION       Current Outpatient Medications  Medication Sig Dispense Refill  . acetaminophen (TYLENOL) 500 MG tablet Take 2 tablets (1,000 mg total) by mouth every 8 (eight) hours as needed for mild pain or moderate pain. 30 tablet 0  . aspirin EC 81 MG tablet Take 81 mg daily by mouth.    Marland Kitchen atenolol (TENORMIN) 50 MG tablet Take 50 mg by mouth daily.    . diclofenac (VOLTAREN) 75 MG EC tablet Take 75 mg by mouth 2 (two) times daily.    . diphenhydrAMINE (BENADRYL) 25 MG tablet Take 25 mg by mouth every 6 (six) hours as needed for allergies or sleep.    Marland Kitchen docusate sodium (COLACE) 100 MG capsule 1 tab 2 times a day while on narcotics.  STOOL SOFTENERTakes Equate Stool  Softner 60 capsule 0  . Glucosamine HCl 1500 MG TABS Take 1,500 mg by mouth daily.    Marland Kitchen ibuprofen (ADVIL,MOTRIN) 800 MG tablet Take 800 mg 3 (three) times daily by mouth.    . methocarbamol (ROBAXIN) 500 MG tablet Take 500 mg by mouth daily as needed for muscle spasms.     . Multiple Vitamin (MULTIVITAMIN WITH MINERALS) TABS tablet Take 1 tablet by mouth daily. For Men's    .  polyethylene glycol (MIRALAX / GLYCOLAX) packet 17grams in 6 oz of water twice a day until bowel movement.  LAXITIVE.  Restart if two days since last bowel movement 14 each 0  . traMADol (ULTRAM) 50 MG tablet Take 100 mg 3 (three) times daily by mouth.    Marland Kitchen aspirin 325 MG tablet 1 tab a day for the next 30 days to prevent blood clots (Patient not taking: Reported on 10/30/2017) 30 tablet 0  . diltiazem (CARDIZEM) 30 MG tablet Take 1 tablet (30 mg total) every 6 (six) hours as needed by mouth (palpitations). 30 tablet 1   No current facility-administered medications for this visit.     Allergies:   Erythromycin; Hydrocodone-acetaminophen; Mobic [meloxicam]; Morphine and related; Prednisone; Pregabalin; Red dye; Z-pak [azithromycin]; and  Penicillins   Social History:  The patient  reports that he quit smoking about 5 years ago. His smoking use included cigarettes. He quit after 2.00 years of use. he has never used smokeless tobacco. He reports that he drinks about 8.4 oz of alcohol per week. He reports that he does not use drugs.   Family History:  The patient's  family history includes CAD in his father and sister; Cancer in his paternal grandfather; Colon polyps in his father; Diabetes in his father; Hypertension in his father and mother.    ROS:  Please see the history of present illness.   All other systems are personally reviewed and negative.    PHYSICAL EXAM: VS:  BP 98/60   Pulse 77   Ht 5\' 9"  (1.753 m)   Wt 251 lb 12.8 oz (114.2 kg)   SpO2 94%   BMI 37.18 kg/m  , BMI Body mass index is 37.18 kg/m. GEN: Well nourished, well developed, in no acute distress  HEENT: normal  Neck: no JVD, carotid bruits, or masses Cardiac: RRR; no murmurs, rubs, or gallops,no edema  Respiratory:  clear to auscultation bilaterally, normal work of breathing GI: soft, nontender, nondistended, + BS MS: no deformity or atrophy  Skin: warm and dry  Neuro:  Strength and sensation are intact Psych: euthymic mood, full affect  EKG:  EKG is ordered today. The ekg ordered today is personally reviewed and shows sinus rhythm 77 bpm with PACs, otherwise normal ekg   Recent Labs: 08/24/2017: BUN 10; Creatinine, Ser 0.78; Hemoglobin 12.6; Platelets 190; Potassium 4.9; Sodium 136  personally reviewed   Lipid Panel     Component Value Date/Time   CHOL  10/29/2007 0424    100        ATP III CLASSIFICATION:  <200     mg/dL   Desirable  161-096  mg/dL   Borderline High  >=045    mg/dL   High   TRIG 409 81/19/1478 0410   personally reviewed   Wt Readings from Last 3 Encounters:  10/30/17 251 lb 12.8 oz (114.2 kg)  08/23/17 248 lb 6.4 oz (112.7 kg)  08/11/17 245 lb 8 oz (111.4 kg)      Other studies personally  reviewed: Additional studies/ records that were reviewed today include: Dr Tawana Scale last note, Dr Lucilla Lame notes Review of the above records today demonstrates: as above   ASSESSMENT AND PLAN:  1.  Palpitations multifactorial He carries a h/o paroxysmal atrial fibrillation, atrial flutter, and ectopy from Dr Donnie Aho.  I do not have any ekgs of his arrhythmias to review. His history sounds at times like PACs, PVCs as the cause. He finds that he is currently doing well with  atenolol daily. I will give cardizem 30mg  q6h prn for palpitations as well. Could consider 30 day monitor to better characterize his arrhythmia if his symptoms worsen. chads2vasc score is 0.  No indication for anticoagulation at this time. Reduce ASA to 81mg  daily (though he rarely takes this medicines)  2. Overweight Body mass index is 37.18 kg/m. Lifestyle modification encouraged  Follow-up:  Return in a year  Current medicines are reviewed at length with the patient today.   The patient does not have concerns regarding his medicines.  The following changes were made today:  none  Labs/ tests ordered today include:  Orders Placed This Encounter  Procedures  . EKG 12-Lead     Signed, Hillis RangeJames Ivannah Zody, MD  10/30/2017 9:05 AM     [facility] [address] [place] [address] [phone] (office) [phone] (fax)

## 2017-11-15 ENCOUNTER — Other Ambulatory Visit: Payer: Self-pay | Admitting: *Deleted

## 2017-11-15 MED ORDER — ATENOLOL 50 MG PO TABS: 50.0000 mg | ORAL_TABLET | Freq: Every day | ORAL | 11 refills | Status: DC

## 2017-11-15 NOTE — Telephone Encounter (Signed)
Ok to fill 

## 2017-11-15 NOTE — Telephone Encounter (Signed)
Patient called and stated that he is a new patient of Dr [name], referred by Dr Cliffton Asters for EP consult regarding his afib. Initial office visit, 10/30/17. He is requesting that Dr [name] refill the atenolol 50 mg qd that was refilled previously by another physician. Okay to refill? Please advise. Thanks, MI

## 2017-11-15 NOTE — Telephone Encounter (Signed)
Spoke with patient and he stated that Dr Donnie Aho turned his care over to Dr [name] so he will no longer see Dr Donnie Aho. Okay to refill? Please advise. Thanks, MI

## 2017-11-15 NOTE — Telephone Encounter (Signed)
Looks like Dr Donnie Aho is his general cardiologist.  Can he get from him?

## 2018-05-18 ENCOUNTER — Other Ambulatory Visit: Payer: Self-pay | Admitting: Family Medicine

## 2018-05-18 DIAGNOSIS — K824 Cholesterolosis of gallbladder: Secondary | ICD-10-CM

## 2018-05-29 ENCOUNTER — Other Ambulatory Visit: Payer: Self-pay

## 2018-10-16 ENCOUNTER — Encounter: Payer: Self-pay | Admitting: Internal Medicine

## 2018-10-18 ENCOUNTER — Encounter: Payer: 59 | Admitting: Cardiology

## 2018-10-18 VITALS — Ht 69.0 in

## 2018-10-18 NOTE — Progress Notes (Signed)
Erroneous

## 2018-10-22 ENCOUNTER — Encounter: Payer: Self-pay | Admitting: Cardiology

## 2018-10-22 ENCOUNTER — Telehealth: Payer: Self-pay

## 2018-10-22 ENCOUNTER — Ambulatory Visit (INDEPENDENT_AMBULATORY_CARE_PROVIDER_SITE_OTHER): Payer: 59 | Admitting: Cardiology

## 2018-10-22 VITALS — BP 116/80 | HR 131 | Ht 69.0 in | Wt 249.0 lb

## 2018-10-22 DIAGNOSIS — I4891 Unspecified atrial fibrillation: Secondary | ICD-10-CM | POA: Diagnosis not present

## 2018-10-22 MED ORDER — RIVAROXABAN 20 MG PO TABS: 20.0000 mg | ORAL_TABLET | Freq: Every day | ORAL | 3 refills | Status: DC

## 2018-10-22 MED ORDER — APIXABAN 5 MG PO TABS: 5.0000 mg | ORAL_TABLET | Freq: Two times a day (BID) | ORAL | 3 refills | Status: DC

## 2018-10-22 MED ORDER — DILTIAZEM HCL 120 MG PO TABS: 120.0000 mg | ORAL_TABLET | Freq: Every day | ORAL | 3 refills | Status: DC

## 2018-10-22 MED ORDER — METOPROLOL TARTRATE 50 MG PO TABS: 50.0000 mg | ORAL_TABLET | Freq: Two times a day (BID) | ORAL | 3 refills | Status: DC

## 2018-10-22 NOTE — Progress Notes (Signed)
10/22/2018 [name]   [birth year]  [id number]  Primary Physician David Montana, MD Primary Cardiologist: Dr. Donnie Soto  Electrophysiologist: Dr. Johney Soto  Reason for Visit/CC: Atrial Fibrillation   HPI:  [name] is a 57 y.o. male who presents for evaluation for tachypalpitations and exertional dyspnea.  Seen a year ago by Dr [name] 10/30/17 for evaluation for atrial fibrillation. Normally a Dr. Donnie Soto pt.   He has had prior history of cardioversion but has since been maintained on rate control therapy with atenolol.  Given CHADS VASC score of 0, there has been no indication for anticoagulation. ASA 81 mg previously advised.   He has been experiencing recent exertional dyspnea and increased HR. His PCP advised that he increase his atenolol to 50 mg BID but this has not resulted in any significant improvement. He also has PRN Diltiazem 30 mg tablets to take as needed.   EKG today shows rapid atrial fibrillation w/ RVR. He had not had any improvement with atenolol BID. BP is soft in the 110s systolic. He is asymptomatic at rest. Only SOB with activity. No CP. His PCP recently checked labs. TSH was WNL. H/H also normal as well as SCr and K.    Current Meds  Medication Sig  . acetaminophen (TYLENOL) 500 MG tablet Take 2 tablets (1,000 mg total) by mouth every 8 (eight) hours as needed for mild pain or moderate pain.  Marland Kitchen aspirin EC 81 MG tablet Take 81 mg daily by mouth.  Marland Kitchen atenolol (TENORMIN) 50 MG tablet Take 1 tablet (50 mg total) by mouth daily.  . diclofenac (VOLTAREN) 75 MG EC tablet Take 75 mg by mouth 2 (two) times daily.  Marland Kitchen diltiazem (CARDIZEM) 30 MG tablet Take 1 tablet (30 mg total) every 6 (six) hours as needed by mouth (palpitations).  . Glucosamine HCl 1500 MG TABS Take 1,500 mg by mouth daily.  . methocarbamol (ROBAXIN) 500 MG tablet Take 500 mg by mouth daily as needed for muscle spasms.   . Multiple Vitamin (MULTIVITAMIN WITH MINERALS) TABS tablet Take 1 tablet by  mouth daily. For Men's  . traMADol (ULTRAM) 50 MG tablet Take 100 mg 3 (three) times daily by mouth.   Allergies  Allergen Reactions  . Erythromycin Other (See Comments)    Abdominal cramps   . Hydrocodone-Acetaminophen Other (See Comments)    Makes him feel "high"  . Mobic [Meloxicam] Other (See Comments)    Lower back and kidney pain   . Morphine And Related Other (See Comments)    Abdominal cramping  Migraine headaches  . Prednisone Hives and Anxiety    Makes him anxious  . Pregabalin Other (See Comments)    Dry mouth and out-of-body feeling  . Red Dye Rash and Other (See Comments)    Turns red Feels like skin on fire  . Z-Pak [Azithromycin] Nausea Only    Severe stomach cramps   . Penicillins Rash    Has patient had a PCN reaction causing immediate rash, facial/tongue/throat swelling, SOB or lightheadedness with hypotension: No Has patient had a PCN reaction causing severe rash involving mucus membranes or skin necrosis: No Has patient had a PCN reaction that required hospitalization: No Has patient had a PCN reaction occurring within the last 10 years: No If all of the above answers are "NO", then may proceed with Cephalosporin use.     Past Medical History:  Diagnosis Date  . Arthritis   . Atrial flutter (HCC)    diagnosed 2005  .  Depression   . Diverticular disease   . GERD (gastroesophageal reflux disease)   . Obesity   . Paroxysmal atrial fibrillation (HCC)   . Primary localized osteoarthritis of left knee    Family History  Problem Relation Age of Onset  . Hypertension Mother   . CAD Father   . Hypertension Father   . Diabetes Father   . Colon polyps Father   . CAD Sister   . Cancer Paternal Grandfather        brain and lung   Past Surgical History:  Procedure Laterality Date  . COLON SURGERY  12/07, 5/08, 10/08  . COLONOSCOPY    . COLOSTOMY  2007  . COLOSTOMY REVERSAL    . EYE SURGERY Bilateral    cataracts  . INCISIONAL HERNIA REPAIR  2008     with mesh  . KNEE ARTHROSCOPY Left 5/10   x2  . MULTIPLE TOOTH EXTRACTIONS    . neck fusion  5/11  . REPLANTATION THUMB Right   . TONSILLECTOMY    . TOTAL KNEE ARTHROPLASTY Left 08/23/2017  . TOTAL KNEE ARTHROPLASTY Left 08/23/2017   Procedure: LEFT TOTAL KNEE ARTHROPLASTY;  Surgeon: Salvatore Marvel, MD;  Location: [facility];  Service: Orthopedics;  Laterality: Left;  . WISDOM TOOTH EXTRACTION     Social History   Socioeconomic History  . Marital status: Married    Spouse name: Not on file  . Number of children: Not on file  . Years of education: Not on file  . Highest education level: Not on file  Occupational History  . Not on file  Social Needs  . Financial resource strain: Not on file  . Food insecurity:    Worry: Not on file    Inability: Not on file  . Transportation needs:    Medical: Not on file    Non-medical: Not on file  Tobacco Use  . Smoking status: Former Smoker    Years: 2.00    Types: Cigarettes    Last attempt to quit: 08/11/2012    Years since quitting: 6.2  . Smokeless tobacco: Never Used  Substance and Sexual Activity  . Alcohol use: Yes    Alcohol/week: 14.0 standard drinks    Types: 14 Cans of beer per week    Comment: 2 beers/ day  . Drug use: No  . Sexual activity: Not on file    Comment: married ([name])  Lifestyle  . Physical activity:    Days per week: Not on file    Minutes per session: Not on file  . Stress: Not on file  Relationships  . Social connections:    Talks on phone: Not on file    Gets together: Not on file    Attends religious service: Not on file    Active member of club or organization: Not on file    Attends meetings of clubs or organizations: Not on file    Relationship status: Not on file  . Intimate partner violence:    Fear of current or ex partner: Not on file    Emotionally abused: Not on file    Physically abused: Not on file    Forced sexual activity: Not on file  Other Topics Concern  . Not on file   Social History Narrative  . Not on file     Review of Systems: General: negative for chills, fever, night sweats or weight changes.  Cardiovascular: negative for chest pain, dyspnea on exertion, edema, orthopnea, palpitations, paroxysmal nocturnal dyspnea  or shortness of breath Dermatological: negative for rash Respiratory: negative for cough or wheezing Urologic: negative for hematuria Abdominal: negative for nausea, vomiting, diarrhea, bright red blood per rectum, melena, or hematemesis Neurologic: negative for visual changes, syncope, or dizziness All other systems reviewed and are otherwise negative except as noted above.   Physical Exam:  Blood pressure 116/80, pulse (!) 131, height 5\' 9"  (1.753 m), weight 249 lb (112.9 kg), SpO2 98 %.  General appearance: alert, cooperative, no distress and moderately obese Neck: no carotid bruit and no JVD Lungs: clear to auscultation bilaterally Heart: irregularly irregular rhythm and tachy rate Extremities: extremities normal, atraumatic, no cyanosis or edema Pulses: 2+ and symmetric Skin: Skin color, texture, turgor normal. No rashes or lesions Neurologic: Grossly normal  EKG atrial fibrillation 131 bpm -- personally reviewed   ASSESSMENT AND PLAN:   1. Afib w/ RVR: Known history of paroxysmal atrial fibrillation.  He now appears to be in A. fib with rapid ventricular response in the 130s.  He is comfortable at rest but gets short of breath with physical activity.  He has had prior cardioversion in the past.  We are unsure how long he is actually been in atrial fibrillation.  He reports full compliance with atenolol and no improvement in heart rate or symptoms after increasing dose to 50 mg twice daily.  I discussed case with Dr. Excell Seltzer, DOD.  We will elect to stop atenolol and treat with metoprolol 50 mg twice daily.  We will also add Cardizem 120 mg daily.  We will also start anticoagulation and plan for possible outpatient direct-current  cardioversion if no spontaneous conversion back to sinus rhythm in 3 weeks.  His PCP recently checked labs and I have reviewed these.  Renal function and H&H within normal limits. TSH also normal. Given his relatively young age and no other significant medical problems, we initially considered once daily Xarelto however this is not covered by his insurance.  His co-pay will be almost $400 a month.  We will therefore treat with Eliquis 5 mg twice daily.  He will come in for a nursing visit at the end of this week to recheck blood pressure and heart rate.  He will keep his follow-up appointment with Dr. Johney Soto on November 11 and will get a repeat EKG.  If he remains in atrial fibrillation, can consider outpatient cardioversion. If symptoms worsen before then, he will call us.   Follow-Up Dr. Johney Soto, 11/05/18.   Brittainy Delmer Islam, M[facility] 10/22/2018 11:08 AM

## 2018-10-22 NOTE — Telephone Encounter (Signed)
Patient called to inform us that his Xarelto was unaffordable.  I told Robbie Lis and she then recommended Eliquis 5 mg bid.  I informed the patient and he will check on pricing.

## 2018-10-22 NOTE — Patient Instructions (Signed)
Medication Instructions:  STOP ASPIRIN/GOODY POWDER STOP Atenolol START Xarelto 20 mg daily START Cardizem 120 mg daily START Lopressor 50 mg twice per day If you need a refill on your cardiac medications before your next appointment, please call your pharmacy.   Lab work: none If you have labs (blood work) drawn today and your tests are completely normal, you will receive your results only by: Marland Kitchen MyChart Message (if you have MyChart) OR . A paper copy in the mail If you have any lab test that is abnormal or we need to change your treatment, we will call you to review the results.  Testing/Procedures: none  Follow-Up: At [facility], you and your health needs are our priority.  As part of our continuing mission to provide you with exceptional heart care, we have created designated Provider Care Teams.  These Care Teams include your primary Cardiologist (physician) and Advanced Practice Providers (APPs -  Physician Assistants and Nurse Practitioners) who all work together to provide you with the care you need, when you need it. .   Any Other Special Instructions Will Be Listed Below (If Applicable).  NURSE VISIT Friday: PLEASE SCHEDULE  BP CHECK EKG PULSE CHECK

## 2018-10-26 ENCOUNTER — Ambulatory Visit (INDEPENDENT_AMBULATORY_CARE_PROVIDER_SITE_OTHER): Payer: 59

## 2018-10-26 VITALS — BP 110/68 | HR 122 | Ht 69.0 in | Wt 245.0 lb

## 2018-10-26 DIAGNOSIS — I4891 Unspecified atrial fibrillation: Secondary | ICD-10-CM | POA: Diagnosis not present

## 2018-10-26 MED ORDER — DILTIAZEM HCL ER COATED BEADS 240 MG PO CP24: 240.0000 mg | ORAL_CAPSULE | Freq: Every day | ORAL | 3 refills | Status: DC

## 2018-10-26 NOTE — Progress Notes (Signed)
1.) Reason for visit: Patient presents to clinic for EKG for rhythm check and BP check.   2.) Name of MD requesting visit: Robbie Lis, PA  3.) H&P: Dr. Donnie Aho patient was seen by Robbie Lis, PA on 10/22/18 and was found to be in Afib with RVR 131 bpm. His ASA and atenolol were stopped and he was started on metoprolol tartrate 50 mg BID, diltiazem 120 mg QD, and an anticoagulant (originally Xarelto, but was unable to afford, so Eliquis 5 mg BID was ordered).   4.) ROS related to problem: Patient presents to clinic today and states that he is no longer SOB. He denies having any fatigue, and states that he has not felt any palpitations or irregular heartbeats. Patient states that he has not started his Eliquis. He states that he was afraid of the interaction with his Diclofenac. He states that he can not function with his Diclofenac. Patient states that he has been taking his metoprolol tartrate 50 mg BID and his diltiazem 120 mg QD. Patient already has an appointment with Dr. [name] on 11/11. Patient was educated on the importance of taking his anticoagulant to decrease risk for stroke.   5.) Assessment and plan per MD: Patient's BP 110/68 and EKG still shows Afib with RVR 122 bpm. Reviewed with DOD, Dr. Ladona Ridgel. Verbal orders given to have patient continue taking metoprolol tartrate 50 mg BID, increase diltiazem to 240 mg QD and take with AM dose of metoprolol with breakfast, and start Eliquis 5 mg BID and watch for S/Sx of bleeding with the Diclofenac. Patient will keep appointment with Dr. [name] on 11/11.

## 2018-10-26 NOTE — Patient Instructions (Addendum)
Medication Instructions:  Your physician has recommended you make the following change in your medication:   1. CONTINUE: metoprolol tartrate 50 mg by mouth twice a day  2. INCREASE: diltiazem to 240 mg by mouth in the morning with breakfast and with morning dose of metoprolol  3. START: Eliquis 5 mg BID and watch for signs or symptoms of bleeding with the Diclofenac   If you need a refill on your cardiac medications before your next appointment, please call your pharmacy.   Lab work: None Ordered  If you have labs (blood work) drawn today and your tests are completely normal, you will receive your results only by: Marland Kitchen MyChart Message (if you have MyChart) OR . A paper copy in the mail If you have any lab test that is abnormal or we need to change your treatment, we will call you to review the results.  Testing/Procedures: None ordered  Follow-Up: . Keep appointment with Dr. [name] opn 11/05/18 at 2:00 PM  Any Other Special Instructions Will Be Listed Below (If Applicable).

## 2018-10-27 ENCOUNTER — Telehealth: Payer: Self-pay | Admitting: Physician Assistant

## 2018-10-27 ENCOUNTER — Other Ambulatory Visit: Payer: Self-pay | Admitting: Physician Assistant

## 2018-10-27 MED ORDER — DILTIAZEM HCL ER COATED BEADS 120 MG PO CP24: 120.0000 mg | ORAL_CAPSULE | Freq: Every day | ORAL | 3 refills | Status: DC

## 2018-10-27 MED ORDER — METOPROLOL TARTRATE 50 MG PO TABS: 100.0000 mg | ORAL_TABLET | Freq: Two times a day (BID) | ORAL | 3 refills | Status: DC

## 2018-10-27 NOTE — Telephone Encounter (Signed)
    Eliquis 5mg  BID. Dilt CD 120mg  BID Lopressor 50mg  BID.    Patient was recently seen in the office by Boyce Medici for atrial fibrillation with RVR with heart rates in the 130s.  His atenolol was discontinued and he was put on Lopressor 50 mg twice daily and diltiazem CD 120 mg daily.  He was brought back in for a nursing care visit on 10/26/2018.  This showed persistence of A. fib with RVR with heart rates in the 120s.  His diltiazem CD was increased to 120 mg twice a day.  The patient has noticed a headache with the diltiazem that was able to be controlled with Tylenol and until the dose was doubled.  He now has an intractable headache which is not touched by Tylenol.  I have asked him to cut back on the diltiazem CD to only 120 mg daily and increase his Lopressor to 100 mg twice a day.  He understands to continue to watch his blood pressure and heart rate and call us if he develops any further issues.  He has an appointment with Dr. [name] on 11/05/2018 for follow-up of his atrial fibrillation.    Cline Crock PA-C  [facility]

## 2018-10-28 ENCOUNTER — Telehealth: Payer: Self-pay | Admitting: Physician Assistant

## 2018-10-28 NOTE — Telephone Encounter (Signed)
The patient called the answering service after-hours today. See phone note yesterday. Patient recently seen in office by Robbie Lis and meds adjusted for afib RVR. He called answering service yesterday due to headache which he has had in the past due to diltiazem. Diltiazem was cut to 120mg  daily and Lopressor increased to 100mg  BID. He decided to stop diltiazem altogether due to the headache which has improved. He felt great this AM. His HR was 74 this AM when he took metoprolol 100mg  BID. A while after, HR was 50 and he felt washed out. He is wondering how to handle PM dose of metoprolol. He seems sensitive to meds in general.   Given that he did not tolerate Lopresor 100mg  when HR was 74 around time of taking it, I told him to base his metoprolol dosing on HR around timing of next dose. If HR <85 he should take 50mg , but if HR >85 I want him to take 100mg  as scheduled. I asked him to call office tomorrow with update on how he is feeling as he may benefit from repeat EKG to re-eval if he is in NSR given that HR is quite improved from recent OV. I will defer to Robbie Lis who recently evaluated the patient in clinic. The patient verbalized understanding and gratitude. Dayna Dunn PA-C

## 2018-10-30 NOTE — Telephone Encounter (Signed)
[name], can you please touch base with pt to see how he is doing?

## 2018-10-30 NOTE — Telephone Encounter (Signed)
Spoke to patient who said that he is feeling much better today.  He stopped Diltiazem 11/3 and has not had a headache since. His HR is 66 bpm.  He is taking Metoprolol Succinate 50 mg bid, BP 115/72.  He is taking Eliquis 5 mg bid, but really wants to discontinue that.    He also informed me that Tramadol, started 10-15 years ago for his arthritis, causes his palpitations, especially in early morning.  This has been going on for years and was wondering about some other type of pain medication.

## 2018-11-05 ENCOUNTER — Ambulatory Visit (INDEPENDENT_AMBULATORY_CARE_PROVIDER_SITE_OTHER): Payer: 59 | Admitting: Internal Medicine

## 2018-11-05 ENCOUNTER — Encounter: Payer: Self-pay | Admitting: Internal Medicine

## 2018-11-05 VITALS — BP 114/78 | HR 135 | Ht 69.0 in | Wt 239.8 lb

## 2018-11-05 DIAGNOSIS — I4891 Unspecified atrial fibrillation: Secondary | ICD-10-CM | POA: Diagnosis not present

## 2018-11-05 DIAGNOSIS — E663 Overweight: Secondary | ICD-10-CM | POA: Diagnosis not present

## 2018-11-05 DIAGNOSIS — R002 Palpitations: Secondary | ICD-10-CM

## 2018-11-05 MED ORDER — APIXABAN 5 MG PO TABS: 5.0000 mg | ORAL_TABLET | Freq: Two times a day (BID) | ORAL | 11 refills | Status: DC

## 2018-11-05 NOTE — Patient Instructions (Addendum)
Medication Instructions:  Your physician has recommended you make the following change in your medication:   1.  Start taking Eliquis 5 mg--Take one tablet by mouth twice a day   Labwork: None ordered.  Testing/Procedures: None ordered.  Follow-Up: Your physician wants you to follow-up in: 3 weeks with Rudi Coco NP at the afib clinic.   Any Other Special Instructions Will Be Listed Below (If Applicable).  If you need a refill on your cardiac medications before your next appointment, please call your pharmacy.

## 2018-11-05 NOTE — Progress Notes (Signed)
PCP: [name], MD Primary Cardiologist: Dr Donnie Aho Primary EP: Dr [name]  [name] is a 57 y.o. male who presents today for routine electrophysiology followup.  Since last being seen in our clinic, the patient reports doing reasonably well.  He has ongoing palpitations as well as SOB with activity.  He denies symptoms at rest. His primary concern is with chronic pain issues. Today, he denies symptoms of  chest pain,  lower extremity edema, dizziness, presyncope, or syncope.  The patient is otherwise without complaint today.   Past Medical History:  Diagnosis Date  . Arthritis   . Atrial flutter (HCC)    diagnosed 2005  . Depression   . Diverticular disease   . GERD (gastroesophageal reflux disease)   . Obesity   . Paroxysmal atrial fibrillation (HCC)   . Primary localized osteoarthritis of left knee    Past Surgical History:  Procedure Laterality Date  . COLON SURGERY  12/07, 5/08, 10/08  . COLONOSCOPY    . COLOSTOMY  2007  . COLOSTOMY REVERSAL    . EYE SURGERY Bilateral    cataracts  . INCISIONAL HERNIA REPAIR  2008   with mesh  . KNEE ARTHROSCOPY Left 5/10   x2  . MULTIPLE TOOTH EXTRACTIONS    . neck fusion  5/11  . REPLANTATION THUMB Right   . TONSILLECTOMY    . TOTAL KNEE ARTHROPLASTY Left 08/23/2017  . TOTAL KNEE ARTHROPLASTY Left 08/23/2017   Procedure: LEFT TOTAL KNEE ARTHROPLASTY;  Surgeon: Salvatore Marvel, MD;  Location: [facility] OR;  Service: Orthopedics;  Laterality: Left;  . WISDOM TOOTH EXTRACTION      ROS- all systems are reviewed and negatives except as per HPI above  Current Outpatient Medications  Medication Sig Dispense Refill  . acetaminophen (TYLENOL) 500 MG tablet Take 2 tablets (1,000 mg total) by mouth every 8 (eight) hours as needed for mild pain or moderate pain. 30 tablet 0  . diclofenac (VOLTAREN) 75 MG EC tablet Take 75 mg by mouth 2 (two) times daily.    . Glucosamine HCl 1500 MG TABS Take 1,500 mg by mouth daily.    . methocarbamol  (ROBAXIN) 500 MG tablet Take 500 mg by mouth daily as needed for muscle spasms.     . Multiple Vitamin (MULTIVITAMIN WITH MINERALS) TABS tablet Take 1 tablet by mouth daily. For Men's    . traMADol (ULTRAM) 50 MG tablet Take 100 mg 3 (three) times daily by mouth.     No current facility-administered medications for this visit.     Physical Exam: Vitals:   11/05/18 1400  BP: 114/78  Pulse: (!) 135  SpO2: 98%  Weight: 239 lb 12.8 oz (108.8 kg)  Height: 5\' 9"  (1.753 m)    GEN- The patient is overweight appearing, alert and oriented x 3 today.   Head- normocephalic, atraumatic Eyes-  Sclera clear, conjunctiva pink Ears- hearing intact Oropharynx- clear Lungs- Clear to ausculation bilaterally, normal work of breathing Heart- irregular rate and rhythm, no murmurs, rubs or gallops, PMI not laterally displaced GI- soft, NT, ND, + BS Extremities- no clubbing, cyanosis, or edema  Wt Readings from Last 3 Encounters:  11/05/18 239 lb 12.8 oz (108.8 kg)  10/26/18 245 lb (111.1 kg)  10/22/18 249 lb (112.9 kg)    EKG tracing ordered today is personally reviewed and shows afib, V rate 135 bpm  Assessment and Plan:  1. Paroxysmal atrial fibrillation/ atrial flutter He has quit taking all medicines.  AF seems  to be more persistent now. chads2vasc score is 0.  He was recently started on eliquis with plans for cardioversion, though he quit eliquis so that he could take diclofenac. He stopped diltiazem due to headache. He feels that metoprolol makes him sluggish. I told him quite frankly today, that given his RVR and nonadherance to medicines that I worry about his risks of stroke and CHF. I have strongly advised that he restart eliquis 5mg  BID.  He will follow-up in the AF clinic in 3 weeks.  At that time, I think we should consider hospitalization for tikosyn or sotalol  (dependant on insurance).  I worry about his compliance with any of our medical therapies however.  As his afib is  relatively persistent, I would advise medical therapy rather than ablation.  Once in sinus rhythm, we should obtain an echo to evaluate for structural heart changes related to his afib.  2. Overweight Body mass index is 35.41 kg/m. Wt Readings from Last 3 Encounters:  11/05/18 239 lb 12.8 oz (108.8 kg)  10/26/18 245 lb (111.1 kg)  10/22/18 249 lb (112.9 kg)  He has made some progress with this  3. Pain He has extensive musculoskeletal pain concerns I have advised that on DOAC we should minimize NSAIDs however I think he will need to take diclofenac from time to time.  Hopefully once we establish sinus rhythm, perhaps he can eventually stop anticoagulation. He may benefit from adjustment of his pain medicine by his PCP or a pain management specialist.  AF clinic in 3 weeks.  Further therapy depending on his demonstration of compliance with eliquis at that time.  Hillis Range MD, [facility] 11/05/2018 2:05 PM

## 2018-11-17 ENCOUNTER — Telehealth: Payer: Self-pay | Admitting: Physician Assistant

## 2018-11-17 NOTE — Telephone Encounter (Signed)
Paged by answering service. Patient feeling heart racing after taking his metoprolol. This resolves in 2-5 minutes however today it continues for 2 hours. However vitals during my call is 130/89 with pulse of 47. Advised that his heart is actually slow not fast. He does not think he is out of rhythm. He does not want to take metoprolol 50mg  BID and will change to Atenolol 50mg  BID (was taking before). Compliant with Eliquis. He will give [place] call if no improvement.

## 2018-11-27 ENCOUNTER — Encounter (HOSPITAL_COMMUNITY): Payer: Self-pay | Admitting: Nurse Practitioner

## 2018-11-27 ENCOUNTER — Telehealth: Payer: Self-pay | Admitting: Pharmacist

## 2018-11-27 ENCOUNTER — Ambulatory Visit (HOSPITAL_COMMUNITY)
Admission: RE | Admit: 2018-11-27 | Discharge: 2018-11-27 | Disposition: A | Discharge status: Home / Self Care | Payer: 59 | Source: Ambulatory Visit | Attending: Nurse Practitioner | Admitting: Nurse Practitioner

## 2018-11-27 VITALS — BP 128/76 | HR 130 | Ht 69.0 in | Wt 250.0 lb

## 2018-11-27 DIAGNOSIS — Z881 Allergy status to other antibiotic agents status: Secondary | ICD-10-CM | POA: Insufficient documentation

## 2018-11-27 DIAGNOSIS — I4819 Other persistent atrial fibrillation: Secondary | ICD-10-CM | POA: Insufficient documentation

## 2018-11-27 DIAGNOSIS — I4891 Unspecified atrial fibrillation: Secondary | ICD-10-CM

## 2018-11-27 DIAGNOSIS — Z888 Allergy status to other drugs, medicaments and biological substances status: Secondary | ICD-10-CM | POA: Insufficient documentation

## 2018-11-27 DIAGNOSIS — R9431 Abnormal electrocardiogram [ECG] [EKG]: Secondary | ICD-10-CM | POA: Insufficient documentation

## 2018-11-27 DIAGNOSIS — Z87891 Personal history of nicotine dependence: Secondary | ICD-10-CM | POA: Insufficient documentation

## 2018-11-27 DIAGNOSIS — Z7901 Long term (current) use of anticoagulants: Secondary | ICD-10-CM | POA: Insufficient documentation

## 2018-11-27 DIAGNOSIS — Z96652 Presence of left artificial knee joint: Secondary | ICD-10-CM | POA: Diagnosis not present

## 2018-11-27 DIAGNOSIS — Z880 Allergy status to penicillin: Secondary | ICD-10-CM | POA: Insufficient documentation

## 2018-11-27 DIAGNOSIS — Z79899 Other long term (current) drug therapy: Secondary | ICD-10-CM | POA: Insufficient documentation

## 2018-11-27 DIAGNOSIS — Z885 Allergy status to narcotic agent status: Secondary | ICD-10-CM | POA: Diagnosis not present

## 2018-11-27 DIAGNOSIS — Z8249 Family history of ischemic heart disease and other diseases of the circulatory system: Secondary | ICD-10-CM | POA: Diagnosis not present

## 2018-11-27 LAB — BASIC METABOLIC PANEL
Anion gap: 9 (ref 5–15)
BUN: 15 mg/dL (ref 6–20)
CO2: 26 mmol/L (ref 22–32)
Calcium: 9.2 mg/dL (ref 8.9–10.3)
Chloride: 106 mmol/L (ref 98–111)
Creatinine, Ser: 1.07 mg/dL (ref 0.61–1.24)
GFR calc Af Amer: >60 mL/min (ref >60)
GFR calc non Af Amer: >60 mL/min (ref >60)
Glucose, Bld: 106 mg/dL — ABNORMAL HIGH (ref 70–99)
Potassium: 4.3 mmol/L (ref 3.5–5.1)
Sodium: 141 mmol/L (ref 135–145)

## 2018-11-27 LAB — MAGNESIUM: Magnesium: 2.1 mg/dL (ref 1.7–2.4)

## 2018-11-27 NOTE — Telephone Encounter (Signed)
Medication list reviewed in anticipation of upcoming Tikosyn initiation. Patient is not taking any contraindicated or QTc prolonging medications.   Patient is anticoagulated on Eliquis 5mg BID on the appropriate dose. Please ensure that patient has not missed any anticoagulation doses in the 3 weeks prior to Tikosyn initiation.   Patient will need to be counseled to avoid use of Benadryl while on Tikosyn and in the 2-3 days prior to Tikosyn initiation. 

## 2018-11-27 NOTE — Progress Notes (Signed)
Primary Care Physician: [name], MD Referring Physician:Dr.[name] is a 57 y.o. male with a h/o persistent afib that is in the afib clinic for discussion for admission for Tikosyn or sotalol. He had been started on BB/CCB and eliquis several weeks ago but did not tolerate lopressor for fatigue and higher doses of CCB. He is poorly rate controlled today and Dr. [name] wants to restore SR. Pt checked on the price of the dofetilide  while here and can afford at $19 a month. He would like to come into the hospital; next Tuesday for drug to be loaded. Has now been on eliquis x 3 weeks without interruption. No benadryl products.    Today, he denies symptoms of palpitations, chest pain, shortness of breath, orthopnea, PND, lower extremity edema, dizziness, presyncope, syncope, or neurologic sequela. The patient is tolerating medications without difficulties and is otherwise without complaint today.   Past Medical History:  Diagnosis Date  . Arthritis   . Atrial flutter (HCC)    diagnosed 2005  . Depression   . Diverticular disease   . GERD (gastroesophageal reflux disease)   . Obesity   . Paroxysmal atrial fibrillation (HCC)   . Primary localized osteoarthritis of left knee    Past Surgical History:  Procedure Laterality Date  . COLON SURGERY  12/07, 5/08, 10/08  . COLONOSCOPY    . COLOSTOMY  2007  . COLOSTOMY REVERSAL    . EYE SURGERY Bilateral    cataracts  . INCISIONAL HERNIA REPAIR  2008   with mesh  . KNEE ARTHROSCOPY Left 5/10   x2  . MULTIPLE TOOTH EXTRACTIONS    . neck fusion  5/11  . REPLANTATION THUMB Right   . TONSILLECTOMY    . TOTAL KNEE ARTHROPLASTY Left 08/23/2017  . TOTAL KNEE ARTHROPLASTY Left 08/23/2017   Procedure: LEFT TOTAL KNEE ARTHROPLASTY;  Surgeon: Salvatore Marvel, MD;  Location: [facility] OR;  Service: Orthopedics;  Laterality: Left;  . WISDOM TOOTH EXTRACTION      Current Outpatient Medications  Medication Sig Dispense Refill  .  acetaminophen (TYLENOL) 650 MG CR tablet Take 1,300 mg by mouth 2 (two) times daily.    Marland Kitchen apixaban (ELIQUIS) 5 MG TABS tablet Take 1 tablet (5 mg total) by mouth 2 (two) times daily. 60 tablet 11  . atenolol (TENORMIN) 50 MG tablet Take 50 mg by mouth 2 (two) times daily.    . diclofenac (VOLTAREN) 50 MG EC tablet Take 50 mg by mouth every other day.    . diltiazem (CARDIZEM LA) 240 MG 24 hr tablet Take 120 mg by mouth 2 (two) times daily.    Marland Kitchen gabapentin (NEURONTIN) 100 MG capsule Take 200 mg by mouth at bedtime.  5  . methocarbamol (ROBAXIN) 500 MG tablet Take 500 mg by mouth as needed for muscle spasms.    Marland Kitchen oxycodone-acetaminophen (PERCOCET) 2.5-325 MG tablet Take 1 tablet by mouth 3 (three) times daily as needed.  0  . traMADol (ULTRAM) 50 MG tablet Take 100 mg by mouth as needed.      No current facility-administered medications for this encounter.     Allergies  Allergen Reactions  . Erythromycin Other (See Comments)    Abdominal cramps   . Hydrocodone-Acetaminophen Other (See Comments)    Makes him feel "high"  . Mobic [Meloxicam] Other (See Comments)    Lower back and kidney pain   . Morphine And Related Other (See Comments)    Abdominal cramping  Migraine headaches  . Prednisone Hives and Anxiety    Makes him anxious  . Pregabalin Other (See Comments)    Dry mouth and out-of-body feeling  . Red Dye Rash and Other (See Comments)    Turns red Feels like skin on fire  . Z-Pak [Azithromycin] Nausea Only    Severe stomach cramps   . Lopressor [Metoprolol Tartrate]     swelling  . Penicillins Rash    Has patient had a PCN reaction causing immediate rash, facial/tongue/throat swelling, SOB or lightheadedness with hypotension: No Has patient had a PCN reaction causing severe rash involving mucus membranes or skin necrosis: No Has patient had a PCN reaction that required hospitalization: No Has patient had a PCN reaction occurring within the last 10 years: No If all of the  above answers are "NO", then may proceed with Cephalosporin use.      Social History   Socioeconomic History  . Marital status: Married    Spouse name: Not on file  . Number of children: Not on file  . Years of education: Not on file  . Highest education level: Not on file  Occupational History  . Not on file  Social Needs  . Financial resource strain: Not on file  . Food insecurity:    Worry: Not on file    Inability: Not on file  . Transportation needs:    Medical: Not on file    Non-medical: Not on file  Tobacco Use  . Smoking status: Former Smoker    Years: 2.00    Types: Cigarettes    Last attempt to quit: 08/11/2012    Years since quitting: 6.2  . Smokeless tobacco: Never Used  Substance and Sexual Activity  . Alcohol use: Yes    Alcohol/week: 14.0 standard drinks    Types: 14 Cans of beer per week    Comment: 2 beers/ day  . Drug use: No  . Sexual activity: Not on file    Comment: married ([name])  Lifestyle  . Physical activity:    Days per week: Not on file    Minutes per session: Not on file  . Stress: Not on file  Relationships  . Social connections:    Talks on phone: Not on file    Gets together: Not on file    Attends religious service: Not on file    Active member of club or organization: Not on file    Attends meetings of clubs or organizations: Not on file    Relationship status: Not on file  . Intimate partner violence:    Fear of current or ex partner: Not on file    Emotionally abused: Not on file    Physically abused: Not on file    Forced sexual activity: Not on file  Other Topics Concern  . Not on file  Social History Narrative  . Not on file    Family History  Problem Relation Age of Onset  . Hypertension Mother   . CAD Father   . Hypertension Father   . Diabetes Father   . Colon polyps Father   . CAD Sister   . Cancer Paternal Grandfather        brain and lung    ROS- All systems are reviewed and negative except as per  the HPI above  Physical Exam: Vitals:   11/27/18 1415  BP: 128/76  Pulse: (!) 130  Weight: 113.4 kg  Height: 5\' 9"  (1.753 m)   Wt Readings  from Last 3 Encounters:  11/27/18 113.4 kg  11/05/18 108.8 kg  10/26/18 111.1 kg    Labs: Lab Results  Component Value Date   NA 136 08/24/2017   K 4.9 08/24/2017   CL 103 08/24/2017   CO2 26 08/24/2017   GLUCOSE 223 (H) 08/24/2017   BUN 10 08/24/2017   CREATININE 0.78 08/24/2017   CALCIUM 8.7 (L) 08/24/2017   PHOS 3.5 11/01/2007   MG 2.0 11/01/2007   Lab Results  Component Value Date   INR 0.95 11/18/2014   Lab Results  Component Value Date   CHOL  10/29/2007    100        ATP III CLASSIFICATION:  <200     mg/dL   Desirable  290-211  mg/dL   Borderline High  >=155    mg/dL   High   TRIG 208 02/17/3611     GEN- The patient is well appearing, alert and oriented x 3 today.   Head- normocephalic, atraumatic Eyes-  Sclera clear, conjunctiva pink Ears- hearing intact Oropharynx- clear Neck- supple, no JVP Lymph- no cervical lymphadenopathy Lungs- Clear to ausculation bilaterally, normal work of breathing Heart- irregular rate and rhythm, no murmurs, rubs or gallops, PMI not laterally displaced GI- soft, NT, ND, + BS Extremities- no clubbing, cyanosis, or edema MS- no significant deformity or atrophy Skin- no rash or lesion Psych- euthymic mood, full affect Neuro- strength and sensation are intact  EKG-afib at 130 bpm, qrs int 82 ms, qtc 485 ms( qtc in SR in 2018, 402 ms) Epic records reviewed    Assessment and Plan: 1. Persistent afib Hospitalization planned for next Tuesday for Tikosyn admit  General precautions re Joice Lofts Denies use of  benadryl products  Drugs to be screened by PharmD Bmet/mag today  qtc  acceptable  in the past with SR  Will see pt back 12/10 for admission   Lupita Leash C. [facility] [address] [place], [place] [address] [phone]

## 2018-12-04 ENCOUNTER — Ambulatory Visit (HOSPITAL_COMMUNITY)
Admission: RE | Admit: 2018-12-04 | Discharge: 2018-12-04 | Disposition: A | Discharge status: Home / Self Care | Payer: 59 | Source: Ambulatory Visit | Attending: Nurse Practitioner | Admitting: Nurse Practitioner

## 2018-12-04 ENCOUNTER — Inpatient Hospital Stay (HOSPITAL_COMMUNITY)
Admission: RE | Admit: 2018-12-04 | Discharge: 2018-12-07 | DRG: 310 | Disposition: A | Discharge status: Home / Self Care | Payer: 59 | Attending: Cardiology | Admitting: Cardiology

## 2018-12-04 ENCOUNTER — Encounter (HOSPITAL_COMMUNITY): Payer: Self-pay | Admitting: Nurse Practitioner

## 2018-12-04 VITALS — BP 110/62 | HR 118 | Ht 69.0 in | Wt 252.0 lb

## 2018-12-04 DIAGNOSIS — Z791 Long term (current) use of non-steroidal anti-inflammatories (NSAID): Secondary | ICD-10-CM

## 2018-12-04 DIAGNOSIS — Z881 Allergy status to other antibiotic agents status: Secondary | ICD-10-CM

## 2018-12-04 DIAGNOSIS — Z888 Allergy status to other drugs, medicaments and biological substances status: Secondary | ICD-10-CM

## 2018-12-04 DIAGNOSIS — Z880 Allergy status to penicillin: Secondary | ICD-10-CM

## 2018-12-04 DIAGNOSIS — Z885 Allergy status to narcotic agent status: Secondary | ICD-10-CM

## 2018-12-04 DIAGNOSIS — Z9841 Cataract extraction status, right eye: Secondary | ICD-10-CM

## 2018-12-04 DIAGNOSIS — Z886 Allergy status to analgesic agent status: Secondary | ICD-10-CM

## 2018-12-04 DIAGNOSIS — Z8249 Family history of ischemic heart disease and other diseases of the circulatory system: Secondary | ICD-10-CM

## 2018-12-04 DIAGNOSIS — I4891 Unspecified atrial fibrillation: Secondary | ICD-10-CM

## 2018-12-04 DIAGNOSIS — Z981 Arthrodesis status: Secondary | ICD-10-CM

## 2018-12-04 DIAGNOSIS — Z9114 Patient's other noncompliance with medication regimen: Secondary | ICD-10-CM

## 2018-12-04 DIAGNOSIS — I4819 Other persistent atrial fibrillation: Principal | ICD-10-CM | POA: Diagnosis present

## 2018-12-04 DIAGNOSIS — K219 Gastro-esophageal reflux disease without esophagitis: Secondary | ICD-10-CM | POA: Diagnosis present

## 2018-12-04 DIAGNOSIS — Z8371 Family history of colonic polyps: Secondary | ICD-10-CM

## 2018-12-04 DIAGNOSIS — Z96652 Presence of left artificial knee joint: Secondary | ICD-10-CM | POA: Diagnosis present

## 2018-12-04 DIAGNOSIS — I4892 Unspecified atrial flutter: Secondary | ICD-10-CM | POA: Diagnosis present

## 2018-12-04 DIAGNOSIS — Z9842 Cataract extraction status, left eye: Secondary | ICD-10-CM

## 2018-12-04 DIAGNOSIS — Z833 Family history of diabetes mellitus: Secondary | ICD-10-CM

## 2018-12-04 DIAGNOSIS — G8929 Other chronic pain: Secondary | ICD-10-CM | POA: Diagnosis present

## 2018-12-04 DIAGNOSIS — Z79899 Other long term (current) drug therapy: Secondary | ICD-10-CM

## 2018-12-04 DIAGNOSIS — F329 Major depressive disorder, single episode, unspecified: Secondary | ICD-10-CM | POA: Diagnosis present

## 2018-12-04 DIAGNOSIS — Z9089 Acquired absence of other organs: Secondary | ICD-10-CM

## 2018-12-04 DIAGNOSIS — Z7901 Long term (current) use of anticoagulants: Secondary | ICD-10-CM

## 2018-12-04 DIAGNOSIS — Z87891 Personal history of nicotine dependence: Secondary | ICD-10-CM

## 2018-12-04 LAB — BASIC METABOLIC PANEL
Anion gap: 10 (ref 5–15)
BUN: 21 mg/dL — ABNORMAL HIGH (ref 6–20)
CO2: 23 mmol/L (ref 22–32)
Calcium: 9.3 mg/dL (ref 8.9–10.3)
Chloride: 108 mmol/L (ref 98–111)
Creatinine, Ser: 1.12 mg/dL (ref 0.61–1.24)
GFR calc Af Amer: >60 mL/min (ref >60)
GFR calc non Af Amer: >60 mL/min (ref >60)
Glucose, Bld: 101 mg/dL — ABNORMAL HIGH (ref 70–99)
Potassium: 4.3 mmol/L (ref 3.5–5.1)
Sodium: 141 mmol/L (ref 135–145)

## 2018-12-04 LAB — MAGNESIUM: Magnesium: 2 mg/dL (ref 1.7–2.4)

## 2018-12-04 MED ORDER — SODIUM CHLORIDE 0.9% FLUSH: 3.0000 mL | INTRAVENOUS | Status: DC | PRN

## 2018-12-04 MED ORDER — SODIUM CHLORIDE 0.9 % IV SOLN: 250.0000 mL | INTRAVENOUS | Status: DC | PRN

## 2018-12-04 MED ORDER — SODIUM CHLORIDE 0.9% FLUSH
3.0000 mL | Freq: Two times a day (BID) | INTRAVENOUS | Status: DC
  Administered 2018-12-05 – 2018-12-07 (×3): 3 mL via INTRAVENOUS

## 2018-12-04 MED ORDER — ATENOLOL 25 MG PO TABS
50.0000 mg | ORAL_TABLET | Freq: Two times a day (BID) | ORAL | Status: DC
  Administered 2018-12-05 – 2018-12-07 (×5): 50 mg via ORAL
  Filled 2018-12-04 (×5): qty 2

## 2018-12-04 MED ORDER — DOFETILIDE 500 MCG PO CAPS
500.0000 ug | ORAL_CAPSULE | Freq: Two times a day (BID) | ORAL | Status: DC
  Administered 2018-12-04 – 2018-12-07 (×6): 500 ug via ORAL
  Filled 2018-12-04 (×6): qty 1

## 2018-12-04 MED ORDER — APIXABAN 5 MG PO TABS
5.0000 mg | ORAL_TABLET | Freq: Two times a day (BID) | ORAL | Status: DC
  Administered 2018-12-05 – 2018-12-07 (×5): 5 mg via ORAL
  Filled 2018-12-04 (×5): qty 1

## 2018-12-04 NOTE — Progress Notes (Signed)
Primary Care Physician: [name], MD Referring Physician:Dr.[name] is a 57 y.o. male with a h/o persistent afib that is in the afib clinic for discussion for admission for Tikosyn or sotalol. He had been started on BB/CCB and eliquis several weeks ago but did not tolerate lopressor for fatigue and higher doses of CCB. He is poorly rate controlled today and Dr. [name] wants to restore SR. Pt checked on the price of the dofetilide  while here and can afford at $19 a month. He would like to come into the hospital; next Tuesday for drug to be loaded. Has now been on eliquis x 3 weeks without interruption. No benadryl products.   Pt is back for admission for Tikosyn load. No missed doses of anticoagulation. No benadryl use.    Today, he denies symptoms of palpitations, chest pain, shortness of breath, orthopnea, PND, lower extremity edema, dizziness, presyncope, syncope, or neurologic sequela. The patient is tolerating medications without difficulties and is otherwise without complaint today.   Past Medical History:  Diagnosis Date  . Arthritis   . Atrial flutter (HCC)    diagnosed 2005  . Depression   . Diverticular disease   . GERD (gastroesophageal reflux disease)   . Obesity   . Paroxysmal atrial fibrillation (HCC)   . Primary localized osteoarthritis of left knee    Past Surgical History:  Procedure Laterality Date  . COLON SURGERY  12/07, 5/08, 10/08  . COLONOSCOPY    . COLOSTOMY  2007  . COLOSTOMY REVERSAL    . EYE SURGERY Bilateral    cataracts  . INCISIONAL HERNIA REPAIR  2008   with mesh  . KNEE ARTHROSCOPY Left 5/10   x2  . MULTIPLE TOOTH EXTRACTIONS    . neck fusion  5/11  . REPLANTATION THUMB Right   . TONSILLECTOMY    . TOTAL KNEE ARTHROPLASTY Left 08/23/2017  . TOTAL KNEE ARTHROPLASTY Left 08/23/2017   Procedure: LEFT TOTAL KNEE ARTHROPLASTY;  Surgeon: Salvatore Marvel, MD;  Location: [facility] OR;  Service: Orthopedics;  Laterality: Left;  .  WISDOM TOOTH EXTRACTION      Current Outpatient Medications  Medication Sig Dispense Refill  . acetaminophen (TYLENOL) 650 MG CR tablet Take 1,300 mg by mouth 2 (two) times daily.    Marland Kitchen apixaban (ELIQUIS) 5 MG TABS tablet Take 1 tablet (5 mg total) by mouth 2 (two) times daily. 60 tablet 11  . atenolol (TENORMIN) 50 MG tablet Take 50 mg by mouth 2 (two) times daily.    . diclofenac (VOLTAREN) 50 MG EC tablet Take 50 mg by mouth every other day.    . diltiazem (CARDIZEM LA) 240 MG 24 hr tablet Take 120 mg by mouth 2 (two) times daily.    Marland Kitchen docusate sodium (COLACE) 100 MG capsule Take 100 mg by mouth daily.    Marland Kitchen gabapentin (NEURONTIN) 100 MG capsule Take 200 mg by mouth at bedtime. 1 capsule in the morning and 2 capsules in the evening  5  . methocarbamol (ROBAXIN) 500 MG tablet Take 500 mg by mouth as needed for muscle spasms.    Marland Kitchen oxycodone-acetaminophen (PERCOCET) 2.5-325 MG tablet Take 1 tablet by mouth 3 (three) times daily as needed.  0  . traMADol (ULTRAM) 50 MG tablet Take 100 mg by mouth 2 (two) times daily.      No current facility-administered medications for this encounter.     Allergies  Allergen Reactions  . Erythromycin Other (See Comments)  Abdominal cramps   . Hydrocodone-Acetaminophen Other (See Comments)    Makes him feel "high"  . Mobic [Meloxicam] Other (See Comments)    Lower back and kidney pain   . Morphine And Related Other (See Comments)    Abdominal cramping  Migraine headaches  . Prednisone Hives and Anxiety    Makes him anxious  . Pregabalin Other (See Comments)    Dry mouth and out-of-body feeling  . Red Dye Rash and Other (See Comments)    Turns red Feels like skin on fire  . Z-Pak [Azithromycin] Nausea Only    Severe stomach cramps   . Lopressor [Metoprolol Tartrate]     swelling  . Penicillins Rash    Has patient had a PCN reaction causing immediate rash, facial/tongue/throat swelling, SOB or lightheadedness with hypotension: No Has patient  had a PCN reaction causing severe rash involving mucus membranes or skin necrosis: No Has patient had a PCN reaction that required hospitalization: No Has patient had a PCN reaction occurring within the last 10 years: No If all of the above answers are "NO", then may proceed with Cephalosporin use.      Social History   Socioeconomic History  . Marital status: Married    Spouse name: Not on file  . Number of children: Not on file  . Years of education: Not on file  . Highest education level: Not on file  Occupational History  . Not on file  Social Needs  . Financial resource strain: Not on file  . Food insecurity:    Worry: Not on file    Inability: Not on file  . Transportation needs:    Medical: Not on file    Non-medical: Not on file  Tobacco Use  . Smoking status: Former Smoker    Years: 2.00    Types: Cigarettes    Last attempt to quit: 08/11/2012    Years since quitting: 6.3  . Smokeless tobacco: Never Used  Substance and Sexual Activity  . Alcohol use: Yes    Alcohol/week: 14.0 standard drinks    Types: 14 Cans of beer per week    Comment: 2 beers/ day  . Drug use: No  . Sexual activity: Not on file    Comment: married ([name])  Lifestyle  . Physical activity:    Days per week: Not on file    Minutes per session: Not on file  . Stress: Not on file  Relationships  . Social connections:    Talks on phone: Not on file    Gets together: Not on file    Attends religious service: Not on file    Active member of club or organization: Not on file    Attends meetings of clubs or organizations: Not on file    Relationship status: Not on file  . Intimate partner violence:    Fear of current or ex partner: Not on file    Emotionally abused: Not on file    Physically abused: Not on file    Forced sexual activity: Not on file  Other Topics Concern  . Not on file  Social History Narrative  . Not on file    Family History  Problem Relation Age of Onset  .  Hypertension Mother   . CAD Father   . Hypertension Father   . Diabetes Father   . Colon polyps Father   . CAD Sister   . Cancer Paternal Grandfather        brain and  lung    ROS- All systems are reviewed and negative except as per the HPI above  Physical Exam: Vitals:   12/04/18 1143  BP: 110/62  Pulse: (!) 118  Weight: 114.3 kg  Height: 5\' 9"  (1.753 m)   Wt Readings from Last 3 Encounters:  12/04/18 114.3 kg  11/27/18 113.4 kg  11/05/18 108.8 kg    Labs: Lab Results  Component Value Date   NA 141 11/27/2018   K 4.3 11/27/2018   CL 106 11/27/2018   CO2 26 11/27/2018   GLUCOSE 106 (H) 11/27/2018   BUN 15 11/27/2018   CREATININE 1.07 11/27/2018   CALCIUM 9.2 11/27/2018   PHOS 3.5 11/01/2007   MG 2.1 11/27/2018   Lab Results  Component Value Date   INR 0.95 11/18/2014   Lab Results  Component Value Date   CHOL  10/29/2007    100        ATP III CLASSIFICATION:  <200     mg/dL   Desirable  956-213  mg/dL   Borderline High  >=086    mg/dL   High   TRIG 578 46/96/2952     GEN- The patient is well appearing, alert and oriented x 3 today.   Head- normocephalic, atraumatic Eyes-  Sclera clear, conjunctiva pink Ears- hearing intact Oropharynx- clear Neck- supple, no JVP Lymph- no cervical lymphadenopathy Lungs- Clear to ausculation bilaterally, normal work of breathing Heart- irregular rate and rhythm, no murmurs, rubs or gallops, PMI not laterally displaced GI- soft, NT, ND, + BS Extremities- no clubbing, cyanosis, or edema MS- no significant deformity or atrophy Skin- no rash or lesion Psych- euthymic mood, full affect Neuro- strength and sensation are intact  EKG-afib at 118 bpm, qrs int 84 ms, qtc 381 ms( qtc in SR in 2018, 402 ms) Epic records reviewed    Assessment and Plan: 1. Persistent afib For Tikosyn admit General precautions re Joice Lofts Denies use of  benadryl products  No qtc prolonging drugs per PharmD  Bmet/mag today  No missed  anticoagulation in the last 3 weeks Continue eliquis 5 mg bid for a chadsvasc score of 0  qtc  acceptable in the past with SR  Awaiting bed availability later today for admission   Lupita Leash C. [facility] [address] [place], [place] [address] [phone]

## 2018-12-05 ENCOUNTER — Encounter (HOSPITAL_COMMUNITY): Payer: Self-pay | Admitting: General Practice

## 2018-12-05 ENCOUNTER — Other Ambulatory Visit: Payer: Self-pay

## 2018-12-05 DIAGNOSIS — Z833 Family history of diabetes mellitus: Secondary | ICD-10-CM | POA: Diagnosis not present

## 2018-12-05 DIAGNOSIS — Z888 Allergy status to other drugs, medicaments and biological substances status: Secondary | ICD-10-CM | POA: Diagnosis not present

## 2018-12-05 DIAGNOSIS — Z791 Long term (current) use of non-steroidal anti-inflammatories (NSAID): Secondary | ICD-10-CM | POA: Diagnosis not present

## 2018-12-05 DIAGNOSIS — K219 Gastro-esophageal reflux disease without esophagitis: Secondary | ICD-10-CM | POA: Diagnosis not present

## 2018-12-05 DIAGNOSIS — Z9841 Cataract extraction status, right eye: Secondary | ICD-10-CM | POA: Diagnosis not present

## 2018-12-05 DIAGNOSIS — Z79899 Other long term (current) drug therapy: Secondary | ICD-10-CM | POA: Diagnosis not present

## 2018-12-05 DIAGNOSIS — Z96652 Presence of left artificial knee joint: Secondary | ICD-10-CM | POA: Diagnosis not present

## 2018-12-05 DIAGNOSIS — Z981 Arthrodesis status: Secondary | ICD-10-CM | POA: Diagnosis not present

## 2018-12-05 DIAGNOSIS — I4819 Other persistent atrial fibrillation: Secondary | ICD-10-CM | POA: Diagnosis not present

## 2018-12-05 DIAGNOSIS — I340 Nonrheumatic mitral (valve) insufficiency: Secondary | ICD-10-CM | POA: Diagnosis not present

## 2018-12-05 DIAGNOSIS — Z885 Allergy status to narcotic agent status: Secondary | ICD-10-CM | POA: Diagnosis not present

## 2018-12-05 DIAGNOSIS — Z880 Allergy status to penicillin: Secondary | ICD-10-CM | POA: Diagnosis not present

## 2018-12-05 DIAGNOSIS — F329 Major depressive disorder, single episode, unspecified: Secondary | ICD-10-CM | POA: Diagnosis not present

## 2018-12-05 DIAGNOSIS — I480 Paroxysmal atrial fibrillation: Secondary | ICD-10-CM

## 2018-12-05 DIAGNOSIS — Z886 Allergy status to analgesic agent status: Secondary | ICD-10-CM | POA: Diagnosis not present

## 2018-12-05 DIAGNOSIS — Z8371 Family history of colonic polyps: Secondary | ICD-10-CM | POA: Diagnosis not present

## 2018-12-05 DIAGNOSIS — Z87891 Personal history of nicotine dependence: Secondary | ICD-10-CM | POA: Diagnosis not present

## 2018-12-05 DIAGNOSIS — Z8249 Family history of ischemic heart disease and other diseases of the circulatory system: Secondary | ICD-10-CM | POA: Diagnosis not present

## 2018-12-05 DIAGNOSIS — Z9842 Cataract extraction status, left eye: Secondary | ICD-10-CM | POA: Diagnosis not present

## 2018-12-05 DIAGNOSIS — Z881 Allergy status to other antibiotic agents status: Secondary | ICD-10-CM | POA: Diagnosis not present

## 2018-12-05 DIAGNOSIS — I4891 Unspecified atrial fibrillation: Secondary | ICD-10-CM | POA: Diagnosis present

## 2018-12-05 DIAGNOSIS — G8929 Other chronic pain: Secondary | ICD-10-CM | POA: Diagnosis not present

## 2018-12-05 DIAGNOSIS — I4892 Unspecified atrial flutter: Secondary | ICD-10-CM | POA: Diagnosis not present

## 2018-12-05 DIAGNOSIS — Z9089 Acquired absence of other organs: Secondary | ICD-10-CM | POA: Diagnosis not present

## 2018-12-05 DIAGNOSIS — Z7901 Long term (current) use of anticoagulants: Secondary | ICD-10-CM | POA: Diagnosis not present

## 2018-12-05 DIAGNOSIS — Z9114 Patient's other noncompliance with medication regimen: Secondary | ICD-10-CM | POA: Diagnosis not present

## 2018-12-05 LAB — BASIC METABOLIC PANEL
Anion gap: 9 (ref 5–15)
BUN: 18 mg/dL (ref 6–20)
CO2: 24 mmol/L (ref 22–32)
Calcium: 8.8 mg/dL — ABNORMAL LOW (ref 8.9–10.3)
Chloride: 106 mmol/L (ref 98–111)
Creatinine, Ser: 0.84 mg/dL (ref 0.61–1.24)
GFR calc Af Amer: >60 mL/min (ref >60)
GFR calc non Af Amer: >60 mL/min (ref >60)
Glucose, Bld: 86 mg/dL (ref 70–99)
Potassium: 4.7 mmol/L (ref 3.5–5.1)
Sodium: 139 mmol/L (ref 135–145)

## 2018-12-05 LAB — MAGNESIUM: Magnesium: 1.9 mg/dL (ref 1.7–2.4)

## 2018-12-05 LAB — HIV ANTIBODY (ROUTINE TESTING W REFLEX): HIV Screen 4th Generation wRfx: NONREACTIVE

## 2018-12-05 MED ORDER — ACETAMINOPHEN 325 MG PO TABS
650.0000 mg | ORAL_TABLET | Freq: Four times a day (QID) | ORAL | Status: DC
  Administered 2018-12-05 – 2018-12-07 (×8): 650 mg via ORAL
  Filled 2018-12-05 (×10): qty 2

## 2018-12-05 MED ORDER — OXYCODONE-ACETAMINOPHEN 5-325 MG PO TABS
0.5000 | ORAL_TABLET | Freq: Three times a day (TID) | ORAL | Status: DC | PRN
  Administered 2018-12-05 – 2018-12-06 (×2): 0.5 via ORAL
  Filled 2018-12-05 (×2): qty 1

## 2018-12-05 MED ORDER — SODIUM CHLORIDE 0.9% FLUSH
3.0000 mL | Freq: Two times a day (BID) | INTRAVENOUS | Status: DC
  Administered 2018-12-05 – 2018-12-06 (×3): 3 mL via INTRAVENOUS

## 2018-12-05 MED ORDER — DOCUSATE SODIUM 100 MG PO CAPS
100.0000 mg | ORAL_CAPSULE | Freq: Every day | ORAL | Status: DC
  Filled 2018-12-05 (×3): qty 1

## 2018-12-05 MED ORDER — SODIUM CHLORIDE 0.9 % IV SOLN: 250.0000 mL | INTRAVENOUS | Status: DC

## 2018-12-05 MED ORDER — DILTIAZEM HCL 60 MG PO TABS
60.0000 mg | ORAL_TABLET | Freq: Three times a day (TID) | ORAL | Status: DC
  Administered 2018-12-05 – 2018-12-07 (×6): 60 mg via ORAL
  Filled 2018-12-05 (×6): qty 1

## 2018-12-05 MED ORDER — MAGNESIUM SULFATE 2 GM/50ML IV SOLN
2.0000 g | Freq: Once | INTRAVENOUS | Status: AC
  Administered 2018-12-05: 2 g via INTRAVENOUS
  Filled 2018-12-05: qty 50

## 2018-12-05 MED ORDER — DICLOFENAC SODIUM 25 MG PO TBEC
50.0000 mg | DELAYED_RELEASE_TABLET | ORAL | Status: DC
  Administered 2018-12-06: 50 mg via ORAL
  Filled 2018-12-05: qty 1
  Filled 2018-12-05: qty 2

## 2018-12-05 MED ORDER — SODIUM CHLORIDE 0.9% FLUSH: 3.0000 mL | INTRAVENOUS | Status: DC | PRN

## 2018-12-05 MED ORDER — GABAPENTIN 100 MG PO CAPS
200.0000 mg | ORAL_CAPSULE | Freq: Every day | ORAL | Status: DC
  Administered 2018-12-05 – 2018-12-06 (×2): 200 mg via ORAL
  Filled 2018-12-05 (×2): qty 2

## 2018-12-05 NOTE — Progress Notes (Signed)
Pt had rapid HR briefly after tikosyn and felt hot.  QTc on EKG is stable.  Will continue with Tikosyn.

## 2018-12-05 NOTE — H&P (Addendum)
Cardiology Admission History and Physical:   Patient ID: [name] MRN: [id number]; DOB: [birth year]   Admission date: 12/04/2018  Primary Care Provider: [name], MD Primary Cardiologist: Dr. Donnie Aho Primary Electrophysiologist:  Dr. [name]  Chief Complaint:  AFib, Tikosyn initiation  Patient Profile:   [name] is a 57 y.o. male with PMHx of GERD, depression, medication noncompliance and AFib/flutter (has been mentioned)  History of Present Illness:   [name] is followed out patient, historically has had issues with stopping he medicines, at his last visit with Dr. [name] 11/05/18, he had stopped all of his medicines including his Eliquis.  He had a lengthy discussion with the patient, with plans to resume his Eliquis and f/u in the AFib clinic for AAD consideration.  Was decided to pursue Tikosyn.  He was seen yesterday in f/u at the AFib clinic, patient confirmed compliance with his Eliquis and came in late last evening for this.  He has received his first dose late last night.  Feels well this morning, no complaints, asks to shower.   Past Medical History:  Diagnosis Date  . Arthritis   . Atrial flutter (HCC)    diagnosed 2005  . Depression   . Diverticular disease   . GERD (gastroesophageal reflux disease)   . Obesity   . Paroxysmal atrial fibrillation (HCC)   . Primary localized osteoarthritis of left knee     Past Surgical History:  Procedure Laterality Date  . COLON SURGERY  12/07, 5/08, 10/08  . COLONOSCOPY    . COLOSTOMY  2007  . COLOSTOMY REVERSAL    . EYE SURGERY Bilateral    cataracts  . INCISIONAL HERNIA REPAIR  2008   with mesh  . KNEE ARTHROSCOPY Left 5/10   x2  . MULTIPLE TOOTH EXTRACTIONS    . neck fusion  5/11  . REPLANTATION THUMB Right   . TONSILLECTOMY    . TOTAL KNEE ARTHROPLASTY Left 08/23/2017  . TOTAL KNEE ARTHROPLASTY Left 08/23/2017   Procedure: LEFT TOTAL KNEE ARTHROPLASTY;  Surgeon: Salvatore Marvel, MD;   Location: [facility] OR;  Service: Orthopedics;  Laterality: Left;  . WISDOM TOOTH EXTRACTION       Medications Prior to Admission: Prior to Admission medications   Medication Sig Start Date End Date Taking? Authorizing Provider  acetaminophen (TYLENOL) 650 MG CR tablet Take 1,300 mg by mouth 2 (two) times daily.    [provider]  apixaban (ELIQUIS) 5 MG TABS tablet Take 1 tablet (5 mg total) by mouth 2 (two) times daily. 11/05/18   Walter Min, Fayrene Fearing, MD  atenolol (TENORMIN) 50 MG tablet Take 50 mg by mouth 2 (two) times daily.    [provider]  diclofenac (VOLTAREN) 50 MG EC tablet Take 50 mg by mouth every other day.    [provider]  diltiazem (CARDIZEM LA) 240 MG 24 hr tablet Take 120 mg by mouth 2 (two) times daily.    [provider]  docusate sodium (COLACE) 100 MG capsule Take 100 mg by mouth daily.    [provider]  gabapentin (NEURONTIN) 100 MG capsule Take 200 mg by mouth at bedtime. 1 capsule in the morning and 2 capsules in the evening 11/07/18   [provider]  methocarbamol (ROBAXIN) 500 MG tablet Take 500 mg by mouth as needed for muscle spasms.    [provider]  oxycodone-acetaminophen (PERCOCET) 2.5-325 MG tablet Take 1 tablet by mouth 3 (three) times daily as needed. 11/19/18   [provider]  traMADol (ULTRAM) 50 MG tablet Take 100 mg by mouth 2 (two) times daily.  10/21/17   [provider]     Allergies:    Allergies  Allergen Reactions  . Erythromycin Other (See Comments)    Abdominal cramps   . Hydrocodone-Acetaminophen Other (See Comments)    Makes him feel "high"  . Mobic [Meloxicam] Other (See Comments)    Lower back and kidney pain   . Morphine And Related Other (See Comments)    Abdominal cramping  Migraine headaches  . Prednisone Hives and Anxiety    Makes him anxious  . Pregabalin Other (See Comments)    Dry mouth and out-of-body feeling  . Red Dye Rash and Other (See  Comments)    Turns red Feels like skin on fire  . Z-Pak [Azithromycin] Nausea Only    Severe stomach cramps   . Lopressor [Metoprolol Tartrate]     swelling  . Penicillins Rash    Has patient had a PCN reaction causing immediate rash, facial/tongue/throat swelling, SOB or lightheadedness with hypotension: No Has patient had a PCN reaction causing severe rash involving mucus membranes or skin necrosis: No Has patient had a PCN reaction that required hospitalization: No Has patient had a PCN reaction occurring within the last 10 years: No If all of the above answers are "NO", then may proceed with Cephalosporin use.      Social History:   Social History   Socioeconomic History  . Marital status: Married    Spouse name: Not on file  . Number of children: Not on file  . Years of education: Not on file  . Highest education level: Not on file  Occupational History  . Not on file  Social Needs  . Financial resource strain: Not on file  . Food insecurity:    Worry: Not on file    Inability: Not on file  . Transportation needs:    Medical: Not on file    Non-medical: Not on file  Tobacco Use  . Smoking status: Former Smoker    Years: 2.00    Types: Cigarettes    Last attempt to quit: 08/11/2012    Years since quitting: 6.3  . Smokeless tobacco: Never Used  Substance and Sexual Activity  . Alcohol use: Yes    Alcohol/week: 14.0 standard drinks    Types: 14 Cans of beer per week    Comment: 2 beers/ day  . Drug use: No  . Sexual activity: Not on file    Comment: married ([name])  Lifestyle  . Physical activity:    Days per week: Not on file    Minutes per session: Not on file  . Stress: Not on file  Relationships  . Social connections:    Talks on phone: Not on file    Gets together: Not on file    Attends religious service: Not on file    Active member of club or organization: Not on file    Attends meetings of clubs or organizations: Not on file    Relationship  status: Not on file  . Intimate partner violence:    Fear of current or ex partner: Not on file    Emotionally abused: Not on file    Physically abused: Not on file    Forced sexual activity: Not on file  Other Topics Concern  . Not on file  Social History Narrative  . Not on file    Family History:  The patient's family history includes CAD in his father and sister; Cancer in his paternal grandfather; Colon polyps in his father; Diabetes in his father; Hypertension in his father and mother.    ROS:  Please see the history of present illness.  All other ROS reviewed and negative.     Physical Exam/Data:   Vitals:   12/04/18 2328 12/05/18 0452 12/05/18 0908  BP: 116/90 115/89 119/89  Pulse: (!) 108  (!) 143  Temp: 98.7 F (37.1 C)    TempSrc: Oral    SpO2: 94% 95%   Weight: 110 kg 110 kg   Height: 5\' 9"  (1.753 m)     No intake or output data in the 24 hours ending 12/05/18 0931 Filed Weights   12/04/18 2328 12/05/18 0452  Weight: 110 kg 110 kg   Body mass index is 35.83 kg/m.  General:  Well nourished, well developed, in no acute distress HEENT: normal Lymph: no adenopathy Neck: no JVD Endocrine:  No thryomegaly Vascular: No carotid bruits Cardiac: irreg-irreg, tachycardic; no murmurs, gallops or rubs Lungs:  CTA b/l, no wheezing, rhonchi or rales  Abd: soft, nontender  Ext: no edema Musculoskeletal:  No deformities Skin: warm and dry  Neuro:  No focal abnormalities noted Psych:  Normal affect    EKG:   Post dose EKG is AFib  103bpm, QTc looks OK, second EKG Is Afib 117, QT difficult to establish, borderline, personally reviewed   Relevant CV Studies:   Laboratory Data:  Chemistry Recent Labs  Lab 12/04/18 1209 12/05/18 0422  NA 141 139  K 4.3 4.7  CL 108 106  CO2 23 24  GLUCOSE 101* 86  BUN 21* 18  CREATININE 1.12 0.84  CALCIUM 9.3 8.8*  GFRNONAA >60 >60  GFRAA >60 >60  ANIONGAP 10 9    No results for input(s): PROT, ALBUMIN, AST, ALT,  ALKPHOS, BILITOT in the last 168 hours. HematologyNo results for input(s): WBC, RBC, HGB, HCT, MCV, MCH, MCHC, RDW, PLT in the last 168 hours. Cardiac EnzymesNo results for input(s): TROPONINI in the last 168 hours. No results for input(s): TROPIPOC in the last 168 hours.  BNPNo results for input(s): BNP, PROBNP in the last 168 hours.  DDimer No results for input(s): DDIMER in the last 168 hours.  Radiology/Studies:  No results found.  Assessment and Plan:   1. Paroxysmal Afib     CHA2DS2Vasc is zero, on Eliquis, appropriately dosed     K+ 4.7     Mag 1.9     Creat 0.84 (stable)     QTc looks OK, discussed with Dr. [name] who has reviewed, will continue   DCCV tomorrow if not in SR       Home meds were reconciled with the patient   For questions or updates, please contact [facility] Please consult [email or web address] for contact info under        Signed, Sheilah Pigeon, PA-C  12/05/2018 9:31 AM    I have seen, examined the patient, and reviewed the above assessment and plan.  Changes to above are made where necessary.  On exam, iRRR.  Admit for initiation of tikosyn.  Will follow qt closely while here.  Co Sign: Hillis Range, MD 12/05/2018 11:28 AM

## 2018-12-05 NOTE — Progress Notes (Signed)
Pharmacy Consult for Tikosyn Electrolyte Replacement  Pharmacy consulted to assist in monitoring and replacing electrolytes in this 57 y.o. male admitted on 12/04/2018 undergoing dofetilide initiation. First dofetilide dose: 12/04/18.  Labs:    Component Value Date/Time   K 4.7 12/05/2018 0422   MG 1.9 12/05/2018 0422     Plan: Potassium: K >/= 4: No additional supplementation required  Magnesium: Mg >1.8: No additional supplementation needed   As patient has not required any potassium replacement so far this admission.  Thank you for allowing pharmacy to participate in this patient's care   Sheppard Coil PharmD., BCPS Clinical Pharmacist 12/05/2018 9:59 AM

## 2018-12-05 NOTE — Progress Notes (Signed)
Pharmacy Review for Dofetilide (Tikosyn) Initiation  Admit Complaint: 57 y.o. male admitted 12/04/2018 with atrial fibrillation to be initiated on dofetilide.   Assessment:  Patient Exclusion Criteria: If any screening criteria checked as "Yes", then  patient  should NOT receive dofetilide until criteria item is corrected. If "Yes" please indicate correction plan.  YES  NO Patient  Exclusion Criteria Correction Plan  []  [x]  Baseline QTc interval is greater than or equal to 440 msec. IF above YES box checked dofetilide contraindicated unless patient has ICD; then may proceed if QTc 500-550 msec or with known ventricular conduction abnormalities may proceed with QTc 550-600 msec. QTc = 381   []  [x]  Magnesium level is less than 1.8 mEq/l : Last magnesium:  Lab Results  Component Value Date   MG 1.9 12/05/2018         []  [x]  Potassium level is less than 4 mEq/l : Last potassium:  Lab Results  Component Value Date   K 4.7 12/05/2018         []  [x]  Patient is known or suspected to have a digoxin level greater than 2 ng/ml: No results found for: DIGOXIN    []  [x]  Creatinine clearance less than 20 ml/min (calculated using Cockcroft-Gault, actual body weight and serum creatinine): Estimated Creatinine Clearance: 117.1 mL/min (by C-G formula based on SCr of 0.84 mg/dL).    []  [x]  Patient has received drugs known to prolong the QT intervals within the last 48 hours (phenothiazines, tricyclics or tetracyclic antidepressants, erythromycin, H-1 antihistamines, cisapride, fluoroquinolones, azithromycin). Drugs not listed above may have an, as yet, undetected potential to prolong the QT interval, updated information on QT prolonging agents is available at this website:QT prolonging agents   []  [x]  Patient received a dose of hydrochlorothiazide (Oretic) alone or in any combination including triamterene (Dyazide, Maxzide) in the last 48 hours.   []  [x]  Patient received a medication known to increase  dofetilide plasma concentrations prior to initial dofetilide dose:  . Trimethoprim (Primsol, Proloprim) in the last 36 hours . Verapamil (Calan, Verelan) in the last 36 hours or a sustained release dose in the last 72 hours . Megestrol (Megace) in the last 5 days  . Cimetidine (Tagamet) in the last 6 hours . Ketoconazole (Nizoral) in the last 24 hours . Itraconazole (Sporanox) in the last 48 hours  . Prochlorperazine (Compazine) in the last 36 hours    []  [x]  Patient is known to have a history of torsades de pointes; congenital or acquired long QT syndromes.   []  [x]  Patient has received a Class 1 antiarrhythmic with less than 2 half-lives since last dose. (Disopyramide, Quinidine, Procainamide, Lidocaine, Mexiletine, Flecainide, Propafenone)   []  [x]  Patient has received amiodarone therapy in the past 3 months or amiodarone level is greater than 0.3 ng/ml.    Patient has been appropriately anticoagulated with apixaban.  Ordering provider was confirmed at [email or web address] if they are not listed on the [facility] Authorized Prescribers list.  Goal of Therapy: Follow renal function, electrolytes, potential drug interactions, and dose adjustment. Provide education and 1 week supply at discharge.  Plan:  [x]   Physician selected initial dose within range recommended for patients level of renal function - will monitor for response.  []   Physician selected initial dose outside of range recommended for patients level of renal function - will discuss if the dose should be altered at this time.   Select One Calculated CrCl  Dose q12h  [x]  > 60 ml/min 500 mcg  []   40-60 ml/min 250 mcg  []  20-40 ml/min 125 mcg   2. Follow up QTc after the first 5 doses, renal function, electrolytes (K & Mg) daily x 3 days, dose adjustment, success of initiation and facilitate 1 week discharge supply as clinically indicated.  3. Follow up with [facility] pharmacy for first week supply at discharge.   Sheppard Coil  PharmD., BCPS Clinical Pharmacist 12/05/2018 9:53 AM

## 2018-12-05 NOTE — Care Management (Signed)
#   2.   S/W MARYJANE @ [facility]   RX #  [id number]  TIKOSYN  375 MCG- NONE FORMULARY  1. TIKOSYN  125 MCG   250 MCG    500 MCG BID COVER- YES CO-PAY-  20 % OF TOTAL COST FOR EACH  PRESCRIPTION TIER- 3 DRUG PRIOR APPROVAL- NO  DOFRTILIDE  375 MCG: NONE FORMULARY  2. DOFETILIDE   125 MCG  250 MCG  500 MCG BID COVER- YES CO-PAY- 20 % OF TOTAL COST FOR EACH PRESCRIPTION TIER- 2 DRUG PRIOR APPROVAL- NO  PREFERRED PHARMACY YES - [facility]

## 2018-12-05 NOTE — Progress Notes (Signed)
Pharmacy Consult for Tikosyn Electrolyte Replacement  Pharmacy consulted to assist in monitoring and replacing electrolytes in this 57 y.o. male admitted on 12/04/2018 undergoing dofetilide initiation. First dofetilide dose: 12/04/18 at 2359.   Patient received bed late last night 12/10, will adjust lab values to match admission values.   Labs:    Component Value Date/Time   K 4.3 12/05/2018 0422   MG 2.0 12/05/2018 0422     Plan: Potassium: K >/= 4: Appropriate to initiate Tikosyn    Magnesium: Mg >1.8: Appropriate to initiate Tikosyn     Thank you for allowing pharmacy to participate in this patient's care   Sheppard Coil PharmD., BCPS Clinical Pharmacist 12/05/2018 9:56 AM

## 2018-12-06 ENCOUNTER — Other Ambulatory Visit: Payer: Self-pay | Admitting: Internal Medicine

## 2018-12-06 ENCOUNTER — Encounter (HOSPITAL_COMMUNITY)
Admission: RE | Disposition: A | Discharge status: Home / Self Care | Payer: Self-pay | Source: Home / Self Care | Attending: Cardiology

## 2018-12-06 ENCOUNTER — Encounter (HOSPITAL_COMMUNITY): Payer: Self-pay | Admitting: *Deleted

## 2018-12-06 ENCOUNTER — Inpatient Hospital Stay (HOSPITAL_COMMUNITY): Payer: 59 | Admitting: Anesthesiology

## 2018-12-06 HISTORY — PX: CARDIOVERSION: SHX1299

## 2018-12-06 LAB — BASIC METABOLIC PANEL
Anion gap: 9 (ref 5–15)
BUN: 15 mg/dL (ref 6–20)
CO2: 26 mmol/L (ref 22–32)
Calcium: 8.7 mg/dL — ABNORMAL LOW (ref 8.9–10.3)
Chloride: 108 mmol/L (ref 98–111)
Creatinine, Ser: 1.03 mg/dL (ref 0.61–1.24)
GFR calc Af Amer: >60 mL/min (ref >60)
GFR calc non Af Amer: >60 mL/min (ref >60)
Glucose, Bld: 91 mg/dL (ref 70–99)
Potassium: 4.7 mmol/L (ref 3.5–5.1)
Sodium: 143 mmol/L (ref 135–145)

## 2018-12-06 LAB — MAGNESIUM: Magnesium: 2 mg/dL (ref 1.7–2.4)

## 2018-12-06 SURGERY — CARDIOVERSION
Anesthesia: General

## 2018-12-06 MED ORDER — PROPOFOL 10 MG/ML IV BOLUS
INTRAVENOUS | Status: DC | PRN
  Administered 2018-12-06: 100 mg via INTRAVENOUS

## 2018-12-06 MED ORDER — ONDANSETRON HCL 4 MG/2ML IJ SOLN: 4.0000 mg | Freq: Once | INTRAMUSCULAR | Status: DC | PRN

## 2018-12-06 MED ORDER — FENTANYL CITRATE (PF) 100 MCG/2ML IJ SOLN: 25.0000 ug | INTRAMUSCULAR | Status: DC | PRN

## 2018-12-06 MED ORDER — PHENYLEPHRINE HCL 10 MG/ML IJ SOLN
INTRAMUSCULAR | Status: DC | PRN
  Administered 2018-12-06: 80 ug via INTRAVENOUS

## 2018-12-06 MED ORDER — LIDOCAINE 2% (20 MG/ML) 5 ML SYRINGE
INTRAMUSCULAR | Status: DC | PRN
  Administered 2018-12-06: 60 mg via INTRAVENOUS

## 2018-12-06 MED ORDER — SODIUM CHLORIDE 0.9 % IV SOLN
INTRAVENOUS | Status: DC | PRN
  Administered 2018-12-06: 14:00:00 via INTRAVENOUS

## 2018-12-06 NOTE — Progress Notes (Addendum)
Progress Note  Patient Name: [name] Date of Encounter: 12/06/2018  Primary Cardiologist: Dr. Tilley/Dr. [name]  Subjective   Palpitations/hot after last 2 doses of Tikosyn, otherwise no complaints, getting worried the medicine isn't going to work for him  Inpatient Medications    Scheduled Meds: . acetaminophen  650 mg Oral QID  . apixaban  5 mg Oral BID  . atenolol  50 mg Oral BID  . diclofenac  50 mg Oral QODAY  . diltiazem  60 mg Oral Q8H  . docusate sodium  100 mg Oral Daily  . dofetilide  500 mcg Oral BID  . gabapentin  200 mg Oral QHS  . sodium chloride flush  3 mL Intravenous Q12H  . sodium chloride flush  3 mL Intravenous Q12H   Continuous Infusions: . sodium chloride    . sodium chloride     PRN Meds: sodium chloride, oxyCODONE-acetaminophen, sodium chloride flush, sodium chloride flush   Vital Signs    Vitals:   12/05/18 2005 12/05/18 2150 12/06/18 0111 12/06/18 0540  BP: (!) 129/96  92/70 115/83  Pulse: (!) 149 (!) 135 87   Resp: 18  19   Temp: 98.1 F (36.7 C)  98.3 F (36.8 C)   TempSrc: Oral  Oral   SpO2: 98%  92%   Weight:   110.7 kg   Height:        Intake/Output Summary (Last 24 hours) at 12/06/2018 0735 Last data filed at 12/05/2018 1119 Gross per 24 hour  Intake 50 ml  Output -  Net 50 ml   Filed Weights   12/04/18 2328 12/05/18 0452 12/06/18 0111  Weight: 110 kg 110 kg 110.7 kg    Telemetry    AFib, 80's100's, occ PVCs.  Couplets, 3 beat NSVT vs ashman's - Personally Reviewed  ECG    AFib 93bpm, manually measured AT , QTc - Personally Reviewed  Physical Exam   GEN: No acute distress.   Neck: No JVD Cardiac: irreg-irreg, no murmurs, rubs, or gallops.  Respiratory: CTA b/l. GI: Soft, nontender, non-distended  MS: No edema; No deformity. Neuro:  Nonfocal  Psych: Normal affect   Labs    Chemistry Recent Labs  Lab 12/04/18 1209 12/05/18 0422 12/06/18 0327  NA 141 139 143  K 4.3 4.7 4.7  CL  108 106 108  CO2 23 24 26   GLUCOSE 101* 86 91  BUN 21* 18 15  CREATININE 1.12 0.84 1.03  CALCIUM 9.3 8.8* 8.7*  GFRNONAA >60 >60 >60  GFRAA >60 >60 >60  ANIONGAP 10 9 9      HematologyNo results for input(s): WBC, RBC, HGB, HCT, MCV, MCH, MCHC, RDW, PLT in the last 168 hours.  Cardiac EnzymesNo results for input(s): TROPONINI in the last 168 hours. No results for input(s): TROPIPOC in the last 168 hours.   BNPNo results for input(s): BNP, PROBNP in the last 168 hours.   DDimer No results for input(s): DDIMER in the last 168 hours.   Radiology    No results found.  Cardiac Studies     Patient Profile     57 y.o. male of GERD, depression, arthritis w/chronic pain, and persistent AFib admitted for Tikosyn initiation  Assessment & Plan    1. Paroxysmal Afib     CHA2DS2Vasc is zero, on Eliquis, appropriately dosed     K+ 4.7     Mag 2.0     Creat 1.03     QTc is borderline  I will  review EKG and telemetry with Dr. [name] prior to this morning Tikosyn does For DCCV this afternoon.       For questions or updates, please contact [facility] Please consult [email or web address] for contact info under        Signed, Sheilah Pigeon, PA-C  12/06/2018, 7:35 AM     I have seen, examined the patient, and reviewed the above assessment and plan.  Changes to above are made where necessary.  On exam,i RRR.  Will plan cardioversion today.  Hopefully home tomorrow.  Co Sign: Hillis Range, MD 12/06/2018 5:35 PM

## 2018-12-06 NOTE — Transfer of Care (Signed)
Immediate Anesthesia Transfer of Care Note  Patient: [name]  Procedure(s) Performed: CARDIOVERSION (N/A )  Patient Location: Endoscopy Unit  Anesthesia Type:General  Level of Consciousness: awake, alert  and oriented  Airway & Oxygen Therapy: Patient Spontanous Breathing and Patient connected to face mask  Post-op Assessment: Report given to RN and Post -op Vital signs reviewed and stable  Post vital signs: Reviewed and stable  Last Vitals:  Vitals Value Taken Time  BP 100/78   Temp    Pulse 97   Resp 18   SpO2 96     Last Pain:  Vitals:   12/06/18 1404  TempSrc:   PainSc: 0-No pain      Patients Stated Pain Goal: 0 (12/05/18 2000)  Complications: No apparent anesthesia complications

## 2018-12-06 NOTE — CV Procedure (Signed)
Procedure: Electrical Cardioversion Indications:  Atrial Fibrillation  Procedure Details:  Consent: Risks of procedure as well as the alternatives and risks of each were explained to the (patient/caregiver).  Consent for procedure obtained.  Time Out: Verified patient identification, verified procedure, site/side was marked, verified correct patient position, special equipment/implants available, medications/allergies/relevent history reviewed, required imaging and test results available. PERFORMED.  Patient placed on cardiac monitor, pulse oximetry, supplemental oxygen as necessary.  Sedation given: per anesthesia Pacer pads placed anterior and posterior chest.  Cardioverted 2 time(s).  Cardioversion with synchronized biphasic 120 J, 200 J shock.  Evaluation:  After two shocks, the patient converted to sinus rhythm, however approximately 2-3 minutes later, he reverted to atrial fibrillation. The procedure had been completed, further attempts at restoring sinus rhythm were not performed.  Findings: Post procedure EKG shows: Atrial Fibrillation Complications: None Patient did tolerate procedure well.  Time Spent Directly with the Patient:  30 minutes   Parke Poisson 12/06/2018, 7:40 PM

## 2018-12-06 NOTE — H&P (View-Only) (Signed)
 Progress Note  Patient Name: [name] Date of Encounter: 12/06/2018  Primary Cardiologist: Dr. Tilley/Dr. Bearett Porcaro  Subjective   Palpitations/hot after last 2 doses of Tikosyn, otherwise no complaints, getting worried the medicine isn't going to work for him  Inpatient Medications    Scheduled Meds: . acetaminophen  650 mg Oral QID  . apixaban  5 mg Oral BID  . atenolol  50 mg Oral BID  . diclofenac  50 mg Oral QODAY  . diltiazem  60 mg Oral Q8H  . docusate sodium  100 mg Oral Daily  . dofetilide  500 mcg Oral BID  . gabapentin  200 mg Oral QHS  . sodium chloride flush  3 mL Intravenous Q12H  . sodium chloride flush  3 mL Intravenous Q12H   Continuous Infusions: . sodium chloride    . sodium chloride     PRN Meds: sodium chloride, oxyCODONE-acetaminophen, sodium chloride flush, sodium chloride flush   Vital Signs    Vitals:   12/05/18 2005 12/05/18 2150 12/06/18 0111 12/06/18 0540  BP: (!) 129/96  92/70 115/83  Pulse: (!) 149 (!) 135 87   Resp: 18  19   Temp: 98.1 F (36.7 C)  98.3 F (36.8 C)   TempSrc: Oral  Oral   SpO2: 98%  92%   Weight:   110.7 kg   Height:        Intake/Output Summary (Last 24 hours) at 12/06/2018 0735 Last data filed at 12/05/2018 1119 Gross per 24 hour  Intake 50 ml  Output -  Net 50 ml   Filed Weights   12/04/18 2328 12/05/18 0452 12/06/18 0111  Weight: 110 kg 110 kg 110.7 kg    Telemetry    AFib, 80's100's, occ PVCs.  Couplets, 3 beat NSVT vs ashman's - Personally Reviewed  ECG    AFib 93bpm, manually measured AT 400ms, QTc 498ms - Personally Reviewed  Physical Exam   GEN: No acute distress.   Neck: No JVD Cardiac: irreg-irreg, no murmurs, rubs, or gallops.  Respiratory: CTA b/l. GI: Soft, nontender, non-distended  MS: No edema; No deformity. Neuro:  Nonfocal  Psych: Normal affect   Labs    Chemistry Recent Labs  Lab 12/04/18 1209 12/05/18 0422 12/06/18 0327  NA 141 139 143  K 4.3 4.7 4.7  CL  108 106 108  CO2 23 24 26  GLUCOSE 101* 86 91  BUN 21* 18 15  CREATININE 1.12 0.84 1.03  CALCIUM 9.3 8.8* 8.7*  GFRNONAA >60 >60 >60  GFRAA >60 >60 >60  ANIONGAP 10 9 9     HematologyNo results for input(s): WBC, RBC, HGB, HCT, MCV, MCH, MCHC, RDW, PLT in the last 168 hours.  Cardiac EnzymesNo results for input(s): TROPONINI in the last 168 hours. No results for input(s): TROPIPOC in the last 168 hours.   BNPNo results for input(s): BNP, PROBNP in the last 168 hours.   DDimer No results for input(s): DDIMER in the last 168 hours.   Radiology    No results found.  Cardiac Studies     Patient Profile     57 y.o. male of GERD, depression, arthritis w/chronic pain, and persistent AFib admitted for Tikosyn initiation  Assessment & Plan    1. Paroxysmal Afib     CHA2DS2Vasc is zero, on Eliquis, appropriately dosed     K+ 4.7     Mag 2.0     Creat 1.03     QTc is borderline  I will   review EKG and telemetry with Dr. Lerin Jech prior to this morning Tikosyn does For DCCV this afternoon.       For questions or updates, please contact [facility] Please consult [email or web address] for contact info under        Signed, Renee Lynn Ursuy, PA-C  12/06/2018, 7:35 AM     I have seen, examined the patient, and reviewed the above assessment and plan.  Changes to above are made where necessary.  On exam,i RRR.  Will plan cardioversion today.  Hopefully home tomorrow.  Co Sign: Legacie Dillingham, MD 12/06/2018 5:35 PM   

## 2018-12-06 NOTE — Anesthesia Preprocedure Evaluation (Signed)
Anesthesia Evaluation  Patient identified by MRN, date of birth, ID band Patient awake    Reviewed: Allergy & Precautions, NPO status , Patient's Chart, lab work & pertinent test results  History of Anesthesia Complications Negative for: history of anesthetic complications  Airway Mallampati: II  TM Distance: >3 FB Neck ROM: Full    Dental  (+) Dental Advisory Given, Missing,    Pulmonary former smoker,    Pulmonary exam normal breath sounds clear to auscultation       Cardiovascular (-) hypertension(-) angina(-) Past MI + dysrhythmias Atrial Fibrillation  Rhythm:Irregular Rate:Abnormal     Neuro/Psych PSYCHIATRIC DISORDERS Depression negative neurological ROS     GI/Hepatic negative GI ROS, Neg liver ROS,   Endo/Other  negative endocrine ROS  Renal/GU negative Renal ROS     Musculoskeletal  (+) Arthritis , Osteoarthritis,    Abdominal   Peds  Hematology negative hematology ROS (+)   Anesthesia Other Findings Day of surgery medications reviewed with the patient.  Reproductive/Obstetrics                             Anesthesia Physical Anesthesia Plan  ASA: III  Anesthesia Plan: General   Post-op Pain Management:    Induction: Intravenous  PONV Risk Score and Plan: 2 and Treatment may vary due to age or medical condition  Airway Management Planned: Mask  Additional Equipment:   Intra-op Plan:   Post-operative Plan:   Informed Consent: I have reviewed the patients History and Physical, chart, labs and discussed the procedure including the risks, benefits and alternatives for the proposed anesthesia with the patient or authorized representative who has indicated his/her understanding and acceptance.   Dental advisory given  Plan Discussed with: CRNA  Anesthesia Plan Comments:         Anesthesia Quick Evaluation

## 2018-12-06 NOTE — Progress Notes (Signed)
Pharmacy Consult for Tikosyn Electrolyte Replacement  Pharmacy consulted to assist in monitoring and replacing electrolytes in this 57 y.o. male admitted on 12/04/2018 undergoing dofetilide initiation. First dofetilide dose: 12/04/18.  Labs:    Component Value Date/Time   K 4.7 12/06/2018 0327   MG 2.0 12/06/2018 0327     Plan: Potassium: K >/= 4: No additional supplementation required  Magnesium: Mg >1.8: No additional supplementation needed  As patient has not required any potassium replacement so far this admission.  Thank you for allowing pharmacy to participate in this patient's care   Sheppard Coil PharmD., BCPS Clinical Pharmacist 12/06/2018 3:17 PM

## 2018-12-06 NOTE — Care Management Note (Addendum)
Case Management Note  Patient Details  Name: CORNELUIS BOEDEKER MRN: [id number] Date of Birth: 11-11-1961  Subjective/Objective:                    Action/Plan:  Spoke w [facility] Ana in [facility] pharmacy [phone] who will change listed Pharmacy to [facility] so that 7 dayTikosyn RX  and DC meds can be filled there prior to DC by 5:00pm Friday.  MD please send 30 day script to [facility] on [address] they have ordered it to be in stock Monday.   Expected Discharge Date:                  Expected Discharge Plan:     In-House Referral:     Discharge planning Services     Post Acute Care Choice:    Choice offered to:     DME Arranged:    DME Agency:     HH Arranged:    HH Agency:     Status of Service:     If discussed at [employer], dates discussed:    Additional Comments:  Lawerance Sabal, RN 12/06/2018, 10:46 AM

## 2018-12-06 NOTE — Progress Notes (Signed)
During night shift, patient had multiple runs of PVCs. Patient asymptomatic, stating "feeling fine". Will continue to monitor patient.

## 2018-12-06 NOTE — Interval H&P Note (Signed)
History and Physical Interval Note:  12/06/2018 7:39 PM  [name]  has presented today for surgery, with the diagnosis of afib  The various methods of treatment have been discussed with the patient and family. After consideration of risks, benefits and other options for treatment, the patient has consented to  Procedure(s): CARDIOVERSION (N/A) as a surgical intervention .  The patient's history has been reviewed, patient examined, no change in status, stable for surgery.  I have reviewed the patient's chart and labs.  Questions were answered to the patient's satisfaction.     Parke Poisson

## 2018-12-06 NOTE — Anesthesia Procedure Notes (Signed)
Procedure Name: General with mask airway Date/Time: 12/06/2018 2:08 PM Performed by: Zollie Scale, CRNA Pre-anesthesia Checklist: Patient identified, Emergency Drugs available, Suction available, Patient being monitored and Timeout performed Patient Re-evaluated:Patient Re-evaluated prior to induction Oxygen Delivery Method: Ambu bag Preoxygenation: Pre-oxygenation with 100% oxygen

## 2018-12-07 ENCOUNTER — Inpatient Hospital Stay (HOSPITAL_COMMUNITY): Payer: 59

## 2018-12-07 DIAGNOSIS — I340 Nonrheumatic mitral (valve) insufficiency: Secondary | ICD-10-CM

## 2018-12-07 DIAGNOSIS — I4891 Unspecified atrial fibrillation: Secondary | ICD-10-CM

## 2018-12-07 LAB — HEPATIC FUNCTION PANEL
ALT: 40 U/L (ref 0–44)
AST: 33 U/L (ref 15–41)
Albumin: 3.6 g/dL (ref 3.5–5.0)
Alkaline Phosphatase: 51 U/L (ref 38–126)
Bilirubin, Direct: 0.2 mg/dL (ref 0.0–0.2)
Indirect Bilirubin: 0.6 mg/dL (ref 0.3–0.9)
Total Bilirubin: 0.8 mg/dL (ref 0.3–1.2)
Total Protein: 6.6 g/dL (ref 6.5–8.1)

## 2018-12-07 LAB — BASIC METABOLIC PANEL
Anion gap: 10 (ref 5–15)
BUN: 16 mg/dL (ref 6–20)
CO2: 26 mmol/L (ref 22–32)
Calcium: 8.7 mg/dL — ABNORMAL LOW (ref 8.9–10.3)
Chloride: 105 mmol/L (ref 98–111)
Creatinine, Ser: 1.14 mg/dL (ref 0.61–1.24)
GFR calc Af Amer: >60 mL/min (ref >60)
GFR calc non Af Amer: >60 mL/min (ref >60)
Glucose, Bld: 111 mg/dL — ABNORMAL HIGH (ref 70–99)
Potassium: 4.4 mmol/L (ref 3.5–5.1)
Sodium: 141 mmol/L (ref 135–145)

## 2018-12-07 LAB — ECHOCARDIOGRAM COMPLETE
Height: 69 in
Weight: 3884.8 oz

## 2018-12-07 LAB — MAGNESIUM: Magnesium: 2 mg/dL (ref 1.7–2.4)

## 2018-12-07 LAB — TSH: TSH: 1.163 u[IU]/mL (ref 0.350–4.500)

## 2018-12-07 MED ORDER — PERFLUTREN LIPID MICROSPHERE
1.0000 mL | INTRAVENOUS | Status: AC | PRN
  Administered 2018-12-07: 3 mL via INTRAVENOUS
  Filled 2018-12-07: qty 10

## 2018-12-07 MED ORDER — AMIODARONE HCL 200 MG PO TABS: 200.0000 mg | ORAL_TABLET | Freq: Two times a day (BID) | ORAL | 3 refills | Status: DC

## 2018-12-07 MED FILL — AMIODARONE HCL 200 MG TABS: 200 | 30 days supply | Qty: 60 | Fill #0 | Status: TO

## 2018-12-07 NOTE — Discharge Summary (Addendum)
ELECTROPHYSIOLOGY PROCEDURE DISCHARGE SUMMARY    Patient ID: [name],  MRN: [id number], DOB/AGE: 58-Sep-1962 58 y.o.  Admit date: 12/04/2018 Discharge date: 12/07/2018  Primary Care Physician: [name], MD  Primary Cardiologist: Dr. Donnie Aho Electrophysiologist: Dr. [name]  Primary Discharge Diagnosis:  1.  Paroxysmal atrial fibrillation status post Tikosyn loading this admission      CHA2DS2Vasc is sero, on Eliquis  Secondary Discharge Diagnosis:  h/o 1. GERD 2. Deperession 3. Arthritis     Chronic pain  Allergies  Allergen Reactions  . Erythromycin Other (See Comments)    Abdominal cramps   . Hydrocodone-Acetaminophen Other (See Comments)    Makes him feel "high"  . Mobic [Meloxicam] Other (See Comments)    Lower back and kidney pain   . Morphine And Related Other (See Comments)    Abdominal cramping  Migraine headaches  . Prednisone Hives and Anxiety    Makes him anxious  . Pregabalin Other (See Comments)    Dry mouth and out-of-body feeling  . Red Dye Rash and Other (See Comments)    Turns red Feels like skin on fire  . Z-Pak [Azithromycin] Nausea Only    Severe stomach cramps   . Lopressor [Metoprolol Tartrate]     swelling  . Penicillins Rash    Has patient had a PCN reaction causing immediate rash, facial/tongue/throat swelling, SOB or lightheadedness with hypotension: No Has patient had a PCN reaction causing severe rash involving mucus membranes or skin necrosis: No Has patient had a PCN reaction that required hospitalization: No Has patient had a PCN reaction occurring within the last 10 years: No If all of the above answers are "NO", then may proceed with Cephalosporin use.       Procedures This Admission:  1.  Tikosyn loading 2.  Direct current cardioversion on 12/06/18 by Dr Jacques Navy which successfully restored SR, though had ERAF only a couple minutes afterwards.  There were no early apparent complications.   Brief  HPI: [name] is a 57 y.o. male with a past medical history as noted above.  The patient is followed by EP in the outpatient setting for treatment options of atrial fibrillation.  Risks, benefits, and alternatives to Tikosyn were reviewed with the patient who wished to proceed.    Hospital Course:  The patient was admitted and Tikosyn was initiated.  Renal function and electrolytes were followed during the hospitalization.  His QTc remained stable.  On 12/06/18 the patient underwent direct current cardioversion which restored sinus rhythm though only briefly with ERAF.  Was decided to continue Tikosyn, though despite his 5th dose he remains in AF this AM.  He has had occasional PVCs, couplets and NSVT 3-4 beats (observed since admission).  Given this was decided not to pursue Tikosyn.  Dr. [name] spoke at length with the patient this morning regarding challenges of AFib management, and rate vs rhythm strategies.  The patient has a fair amount of symptoms with his AF.  The patient would like to try and avoid ablation, will plan for Tikosyn washout, and start amiodarone Monday (Dr. [name] discussed potential side effects of this drug with the patient).  On the day of discharge, the patient feels well, he was examined by Dr [name] who considered him  stable for discharge to home.  F/U with the AFib clinic is in place. HR here has been generally 80's-110, some faster, the patient reports better rate control at home by his observations, will continue his home  regime unchanged with the addition of the amiodarone. Echo will done prior to discharge, baseline TSH, LFTs and CXR as well.  Follow up result at his AFib clinic visit.  Home meds were reviewed with the patient given they flag for allergies, he is taking all of his current home medicines without allergy/reactions.  D/w RPH, amiodarone/pacerone should not have be an issue with red dye allergy (pt reported rash after eating a red hotdog, cleared with  benadryl)   Physical Exam: Vitals:   12/06/18 2157 12/06/18 2158 12/07/18 0541 12/07/18 0811  BP: (!) 120/96  (!) 131/93 98/69  Pulse: (!) 111 (!) 131 100 97  Resp:      Temp: 98.2 F (36.8 C)  97.8 F (36.6 C)   TempSrc: Oral  Oral   SpO2: 98%  100%   Weight:   110.1 kg   Height:        GEN- The patient is well appearing, alert and oriented x 3 today.   HEENT: normocephalic, atraumatic; sclera clear, conjunctiva pink; hearing intact; oropharynx clear; neck supple, no JVP Lymph- no cervical lymphadenopathy Lungs- CTA b/l, normal work of breathing.  No wheezes, rales, rhonchi Heart- irreg-irreg, no murmurs, rubs or gallops, PMI not laterally displaced GI- soft, non-tender, non-distended Extremities- no clubbing, cyanosis, or edema MS- no significant deformity or atrophy Skin- warm and dry, no rash or lesion Psych- euthymic mood, full affect Neuro- strength and sensation are intact   Labs:   Lab Results  Component Value Date   WBC 10.9 (H) 08/24/2017   HGB 12.6 (L) 08/24/2017   HCT 39.2 08/24/2017   MCV 93.1 08/24/2017   PLT 190 08/24/2017    Recent Labs  Lab 12/07/18 0332  NA 141  K 4.4  CL 105  CO2 26  BUN 16  CREATININE 1.14  CALCIUM 8.7*  GLUCOSE 111*     Discharge Medications:  Allergies as of 12/07/2018      Reactions   Erythromycin Other (See Comments)   Abdominal cramps   Hydrocodone-acetaminophen Other (See Comments)   Makes him feel "high"   Mobic [meloxicam] Other (See Comments)   Lower back and kidney pain    Morphine And Related Other (See Comments)   Abdominal cramping  Migraine headaches   Prednisone Hives, Anxiety   Makes him anxious   Pregabalin Other (See Comments)   Dry mouth and out-of-body feeling   Red Dye Rash, Other (See Comments)   Turns red Feels like skin on fire   Z-pak [azithromycin] Nausea Only   Severe stomach cramps   Lopressor [metoprolol Tartrate]    swelling   Penicillins Rash   Has patient had a PCN  reaction causing immediate rash, facial/tongue/throat swelling, SOB or lightheadedness with hypotension: No Has patient had a PCN reaction causing severe rash involving mucus membranes or skin necrosis: No Has patient had a PCN reaction that required hospitalization: No Has patient had a PCN reaction occurring within the last 10 years: No If all of the above answers are "NO", then may proceed with Cephalosporin use.      Medication List    TAKE these medications   acetaminophen 650 MG CR tablet Commonly known as:  TYLENOL Take 1,300 mg by mouth 2 (two) times daily.   amiodarone 200 MG tablet Commonly known as:  PACERONE Take 1 tablet (200 mg total) by mouth 2 (two) times daily. Notes to patient:  Do not start until Monday 12/10/18   apixaban 5 MG Tabs tablet Commonly  known as:  ELIQUIS Take 1 tablet (5 mg total) by mouth 2 (two) times daily.   atenolol 50 MG tablet Commonly known as:  TENORMIN Take 50 mg by mouth 2 (two) times daily.   diclofenac 75 MG EC tablet Commonly known as:  VOLTAREN Take 75 mg by mouth every other day.   diltiazem 120 MG 24 hr tablet Commonly known as:  CARDIZEM LA Take 60 mg by mouth 3 (three) times daily.   docusate sodium 100 MG capsule Commonly known as:  COLACE Take 100 mg by mouth daily.   gabapentin 100 MG capsule Commonly known as:  NEURONTIN Take 100-200 mg by mouth See admin instructions. 100mg  in the morning and 200mg  in the evening   methocarbamol 500 MG tablet Commonly known as:  ROBAXIN Take 500 mg by mouth as needed for muscle spasms.   oxycodone-acetaminophen 2.5-325 MG tablet Commonly known as:  PERCOCET Take 1 tablet by mouth every 8 (eight) hours as needed for pain.   traMADol 50 MG tablet Commonly known as:  ULTRAM Take 100 mg by mouth 2 (two) times daily.       Disposition: Home Discharge Instructions    Diet - low sodium heart healthy   Complete by:  As directed    Increase activity slowly   Complete by:  As  directed      Follow-up Information    [facility] Follow up.   Specialty:  Cardiology Why:  12/21/18 @ 9:30AM Contact information: [address] [id number] [place]c [address] [place] [address] [phone]          Duration of Discharge Encounter: Greater than 30 minutes including physician time.  Signed, Francis Dowse, PA-C 12/07/2018 10:50 AM  I have seen, examined the patient, and reviewed the above assessment and plan.  Changes to above are made where necessary.  On exam, iRRR.  Failed medical therapy with tikosyn.  EF is quite low, likely due to RVR.  Will start coreg.  We have a lengthy discussion today about treatment options.  I have offered rate control, amiodarone, or ablation.  Risks and benefits to all three options were discussed at length.  At this time, he is very clear that he would prefer to try outpatient initiation of amiodarone.   Risks and benefits of Amiodarone including liver, lung, thyroid, occular toxicity and even death were discussed at length.  He accepts risks and wishes to proceed. Stop tikosyn Start amiodarone 200mg  BID on Monday.   Follow-up in AF clinic in 2 weeks. Plan cardioversion in 4 weeks Will need close follow-up in the AF clinic.  Hillis Range MD, [facility] 12/07/2018 9:45 PM

## 2018-12-07 NOTE — Progress Notes (Signed)
Patient having frequent PVCs, couplets and 3 beat runs through out the night. K 4.7, mag 2.0, QTC post Tikosyn 447. Patient is asymptomatic. Paged Cards, got orders for STAT labs. Will continue to monitor patient closely.

## 2018-12-07 NOTE — Discharge Instructions (Signed)

## 2018-12-07 NOTE — Progress Notes (Signed)
Pharmacy Consult for Tikosyn Electrolyte Replacement  Pharmacy consulted to assist in monitoring and replacing electrolytes in this 57 y.o. male admitted on 12/04/2018 undergoing dofetilide initiation. First dofetilide dose: 12/04/18.  Labs:    Component Value Date/Time   K 4.4 12/07/2018 0332   MG 2.0 12/07/2018 0332     Plan: Potassium: K >/= 4: No additional supplementation required  Magnesium: Mg >1.8: No additional supplementation needed   Patient has not received potassium supplementation potassium has been WNL during admission, do not recommend discharging patient with potassium supplementation.  Thank you for allowing pharmacy to participate in this patient's care   Marcelino Freestone, PharmD PGY2 Cardiology Pharmacy Resident Phone [phone] Please check AMION for all Pharmacist numbers by unit 12/07/2018 7:35 AM

## 2018-12-07 NOTE — Anesthesia Postprocedure Evaluation (Signed)
Anesthesia Post Note  Patient: [name]  Procedure(s) Performed: CARDIOVERSION (N/A )     Patient location during evaluation: Endoscopy Anesthesia Type: General Level of consciousness: awake and alert Pain management: pain level controlled Vital Signs Assessment: post-procedure vital signs reviewed and stable Respiratory status: spontaneous breathing, nonlabored ventilation and respiratory function stable Cardiovascular status: blood pressure returned to baseline and stable Postop Assessment: no apparent nausea or vomiting Anesthetic complications: no    Last Vitals:  Vitals:   12/07/18 0541 12/07/18 0811  BP: (!) 131/93 98/69  Pulse: 100 97  Resp:    Temp: 36.6 C   SpO2: 100%     Last Pain:  Vitals:   12/07/18 0541  TempSrc: Oral  PainSc:                  Cecile Hearing

## 2018-12-07 NOTE — Progress Notes (Signed)
Echocardiogram 2D Echocardiogram has been performed.  12/07/2018 12:22 PM Gertie Fey, MHA, RVT, RDCS, RDMS

## 2018-12-08 ENCOUNTER — Encounter (HOSPITAL_COMMUNITY): Payer: Self-pay | Admitting: Internal Medicine

## 2018-12-10 ENCOUNTER — Telehealth (HOSPITAL_COMMUNITY): Payer: Self-pay | Admitting: *Deleted

## 2018-12-10 MED ORDER — DILTIAZEM HCL ER COATED BEADS 120 MG PO TB24: ORAL_TABLET | ORAL | Status: DC

## 2018-12-10 NOTE — Telephone Encounter (Signed)
Patient very hesitant to take amiodarone due to his father's history of bradycardia. He states over the weekend he was in NSR in the 60s. Today he is back in AF 80-120 - would like to increase diltiazem to 120mg  in the AM and 60mg  in the PM - will call me on Wednesday with HR/BP log to determine if further drug titration needed.

## 2018-12-10 NOTE — Telephone Encounter (Signed)
Patient called in stating he thought about everything from the hospital stay this weekend and has decided he does not want to start Amiodarone as previously planned after failed tikosyn attempt. He would like to explore option of Ablation with Dr. [name]. Will forward information to Dr. [name] and his RN.

## 2018-12-11 NOTE — Telephone Encounter (Signed)
Patient called in stating he increased his cardizem last night and it dropped his blood pressure down in the 82/60 range - he is now ready to start amiodarone 200mg  BID and stop diltiazem daily. His HR is currently between 80-91 and BP is 106/65. Pt will call if issues before follow up appointment next week.

## 2018-12-12 ENCOUNTER — Telehealth: Payer: Self-pay

## 2018-12-12 DIAGNOSIS — I480 Paroxysmal atrial fibrillation: Secondary | ICD-10-CM

## 2018-12-12 NOTE — Telephone Encounter (Signed)
Call made to Pt.  Advised would like to go ahead and get Pt on schedule for an ablation since first available was not until end of January.  Per Pt he would like to schedule ablation.  Pt will have OV with Dr. [name] prior to ablation.  Ordered cardiac Ct to initiate insurance review.  No further questions at this time.

## 2018-12-14 ENCOUNTER — Telehealth (HOSPITAL_COMMUNITY): Payer: Self-pay | Admitting: *Deleted

## 2018-12-14 NOTE — Telephone Encounter (Signed)
Patient called in today stating heart rates are intermittently not controlled since stopping diltiazem to start amiodarone. Discussed with David Coco NP will increase amiodarone to 400mg  twice a day through the weekend -- call on Monday with HR trends. Patient in agreement.

## 2018-12-17 NOTE — Telephone Encounter (Signed)
Better rate controlled will continue amiodarone at 400mg  bid until follow up on Friday.

## 2018-12-21 ENCOUNTER — Ambulatory Visit (HOSPITAL_COMMUNITY)
Admission: RE | Admit: 2018-12-21 | Discharge: 2018-12-21 | Disposition: A | Discharge status: Home / Self Care | Payer: 59 | Source: Ambulatory Visit | Attending: Nurse Practitioner | Admitting: Nurse Practitioner

## 2018-12-21 ENCOUNTER — Encounter (HOSPITAL_COMMUNITY): Payer: Self-pay | Admitting: Nurse Practitioner

## 2018-12-21 VITALS — BP 118/76 | HR 97 | Ht 69.0 in | Wt 246.0 lb

## 2018-12-21 DIAGNOSIS — Z888 Allergy status to other drugs, medicaments and biological substances status: Secondary | ICD-10-CM | POA: Diagnosis not present

## 2018-12-21 DIAGNOSIS — Z79899 Other long term (current) drug therapy: Secondary | ICD-10-CM | POA: Diagnosis not present

## 2018-12-21 DIAGNOSIS — Z8249 Family history of ischemic heart disease and other diseases of the circulatory system: Secondary | ICD-10-CM | POA: Diagnosis not present

## 2018-12-21 DIAGNOSIS — Z833 Family history of diabetes mellitus: Secondary | ICD-10-CM | POA: Diagnosis not present

## 2018-12-21 DIAGNOSIS — Z79891 Long term (current) use of opiate analgesic: Secondary | ICD-10-CM | POA: Diagnosis not present

## 2018-12-21 DIAGNOSIS — Z87891 Personal history of nicotine dependence: Secondary | ICD-10-CM | POA: Insufficient documentation

## 2018-12-21 DIAGNOSIS — Z96652 Presence of left artificial knee joint: Secondary | ICD-10-CM | POA: Insufficient documentation

## 2018-12-21 DIAGNOSIS — I4819 Other persistent atrial fibrillation: Secondary | ICD-10-CM | POA: Diagnosis not present

## 2018-12-21 MED ORDER — AMIODARONE HCL 200 MG PO TABS: 200.0000 mg | ORAL_TABLET | Freq: Two times a day (BID) | ORAL | 3 refills | Status: DC

## 2018-12-21 NOTE — Progress Notes (Signed)
Primary Care Physician: [name], MD Referring Physician:Dr.[name] is a 57 y.o. male with a h/o persistent afib that is in the afib clinic for discussion for admission for Tikosyn or sotalol. He had been started on BB/CCB and eliquis several weeks ago but did not tolerate lopressor for fatigue and higher doses of CCB made his head feel fuzzy.Marland Kitchen He was having  poor  rate control with reduced EFand Dr. [name] wanted to restore SR.   Pt unfortunately would not convert with dofetilide. He was given option of ablation vrs going to amiodarone. He started amiodarone but then changed his mind and is going to ablation 1/30, with an office visit with Dr. [name] 1/8. He has been on amiodarone 400 mg bid for the last week, now will reduce to 200 mg bid. He is better rate controlled.  Today, he denies symptoms of palpitations, chest pain, shortness of breath, orthopnea, PND, lower extremity edema, dizziness, presyncope, syncope, or neurologic sequela. The patient is tolerating medications without difficulties and is otherwise without complaint today.   Past Medical History:  Diagnosis Date  . Arthritis   . Atrial flutter (HCC)    diagnosed 2005  . Depression   . Diverticular disease   . GERD (gastroesophageal reflux disease)   . Obesity   . Paroxysmal atrial fibrillation (HCC)   . Primary localized osteoarthritis of left knee    Past Surgical History:  Procedure Laterality Date  . CARDIOVERSION N/A 12/06/2018   Procedure: CARDIOVERSION;  Surgeon: Parke Poisson, MD;  Location: [facility] ENDOSCOPY;  Service: Cardiovascular;  Laterality: N/A;  . COLON SURGERY  12/07, 5/08, 10/08  . COLONOSCOPY    . COLOSTOMY  2007  . COLOSTOMY REVERSAL    . EYE SURGERY Bilateral    cataracts  . INCISIONAL HERNIA REPAIR  2008   with mesh  . KNEE ARTHROSCOPY Left 5/10   x2  . MULTIPLE TOOTH EXTRACTIONS    . neck fusion  5/11  . REPLANTATION THUMB Right   . TONSILLECTOMY    . TOTAL  KNEE ARTHROPLASTY Left 08/23/2017  . TOTAL KNEE ARTHROPLASTY Left 08/23/2017   Procedure: LEFT TOTAL KNEE ARTHROPLASTY;  Surgeon: Salvatore Marvel, MD;  Location: [facility] OR;  Service: Orthopedics;  Laterality: Left;  . WISDOM TOOTH EXTRACTION      Current Outpatient Medications  Medication Sig Dispense Refill  . acetaminophen (TYLENOL) 650 MG CR tablet Take 1,300 mg by mouth 2 (two) times daily.    Marland Kitchen amiodarone (PACERONE) 200 MG tablet Take 1 tablet (200 mg total) by mouth 2 (two) times daily. 60 tablet 3  . apixaban (ELIQUIS) 5 MG TABS tablet Take 1 tablet (5 mg total) by mouth 2 (two) times daily. 60 tablet 11  . atenolol (TENORMIN) 50 MG tablet Take 1 tablet (50 mg total) by mouth 2 (two) times daily. (BETA BLOCKER) 60 tablet 10  . diclofenac (VOLTAREN) 75 MG EC tablet Take 75 mg by mouth every other day.     . docusate sodium (COLACE) 100 MG capsule Take 100 mg by mouth daily.    Marland Kitchen gabapentin (NEURONTIN) 100 MG capsule Take 400 mg by mouth See admin instructions. 200 mg in the morning and 200 mg in the evening  5  . methocarbamol (ROBAXIN) 500 MG tablet Take 1,000 mg by mouth 3 (three) times daily.     . traMADol (ULTRAM) 50 MG tablet Take 100 mg by mouth 2 (two) times daily.     Marland Kitchen  oxycodone-acetaminophen (PERCOCET) 2.5-325 MG tablet Take 1 tablet by mouth every 8 (eight) hours as needed for pain.   0   No current facility-administered medications for this encounter.     Allergies  Allergen Reactions  . Erythromycin Other (See Comments)    Abdominal cramps   . Hydrocodone-Acetaminophen Other (See Comments)    Makes him feel "high"  . Mobic [Meloxicam] Other (See Comments)    Lower back and kidney pain   . Morphine And Related Other (See Comments)    Abdominal cramping  Migraine headaches  . Prednisone Hives and Anxiety    Makes him anxious  . Pregabalin Other (See Comments)    Dry mouth and out-of-body feeling  . Red Dye Rash and Other (See Comments)    Turns red Feels like skin  on fire  . Z-Pak [Azithromycin] Nausea Only    Severe stomach cramps   . Lopressor [Metoprolol Tartrate]     swelling  . Penicillins Rash    Has patient had a PCN reaction causing immediate rash, facial/tongue/throat swelling, SOB or lightheadedness with hypotension: No Has patient had a PCN reaction causing severe rash involving mucus membranes or skin necrosis: No Has patient had a PCN reaction that required hospitalization: No Has patient had a PCN reaction occurring within the last 10 years: No If all of the above answers are "NO", then may proceed with Cephalosporin use.      Social History   Socioeconomic History  . Marital status: Married    Spouse name: Not on file  . Number of children: Not on file  . Years of education: Not on file  . Highest education level: Not on file  Occupational History  . Not on file  Social Needs  . Financial resource strain: Not on file  . Food insecurity:    Worry: Not on file    Inability: Not on file  . Transportation needs:    Medical: Not on file    Non-medical: Not on file  Tobacco Use  . Smoking status: Former Smoker    Years: 2.00    Types: Cigarettes    Last attempt to quit: 08/11/2012    Years since quitting: 6.3  . Smokeless tobacco: Never Used  Substance and Sexual Activity  . Alcohol use: Yes    Alcohol/week: 14.0 standard drinks    Types: 14 Cans of beer per week    Comment: 2 beers/ day  . Drug use: No  . Sexual activity: Not on file    Comment: married ([name])  Lifestyle  . Physical activity:    Days per week: Not on file    Minutes per session: Not on file  . Stress: Not on file  Relationships  . Social connections:    Talks on phone: Not on file    Gets together: Not on file    Attends religious service: Not on file    Active member of club or organization: Not on file    Attends meetings of clubs or organizations: Not on file    Relationship status: Not on file  . Intimate partner violence:    Fear  of current or ex partner: Not on file    Emotionally abused: Not on file    Physically abused: Not on file    Forced sexual activity: Not on file  Other Topics Concern  . Not on file  Social History Narrative  . Not on file    Family History  Problem Relation Age  of Onset  . Hypertension Mother   . CAD Father   . Hypertension Father   . Diabetes Father   . Colon polyps Father   . CAD Sister   . Cancer Paternal Grandfather        brain and lung    ROS- All systems are reviewed and negative except as per the HPI above  Physical Exam: Vitals:   12/21/18 0929  BP: 118/76  Pulse: 97  Weight: 111.6 kg  Height: 5\' 9"  (1.753 m)   Wt Readings from Last 3 Encounters:  12/21/18 111.6 kg  12/07/18 110.1 kg  12/04/18 114.3 kg    Labs: Lab Results  Component Value Date   NA 141 12/07/2018   K 4.4 12/07/2018   CL 105 12/07/2018   CO2 26 12/07/2018   GLUCOSE 111 (H) 12/07/2018   BUN 16 12/07/2018   CREATININE 1.14 12/07/2018   CALCIUM 8.7 (L) 12/07/2018   PHOS 3.5 11/01/2007   MG 2.0 12/07/2018   Lab Results  Component Value Date   INR 0.95 11/18/2014   Lab Results  Component Value Date   CHOL  10/29/2007    100        ATP III CLASSIFICATION:  <200     mg/dL   Desirable  [id number]  mg/dL   Borderline High  >=696>=240    mg/dL   High   TRIG 295103 28/41/324411/05/2007     GEN- The patient is well appearing, alert and oriented x 3 today.   Head- normocephalic, atraumatic Eyes-  Sclera clear, conjunctiva pink Ears- hearing intact Oropharynx- clear Neck- supple, no JVP Lymph- no cervical lymphadenopathy Lungs- Clear to ausculation bilaterally, normal work of breathing Heart- irregular rate and rhythm, no murmurs, rubs or gallops, PMI not laterally displaced GI- soft, NT, ND, + BS Extremities- no clubbing, cyanosis, or edema MS- no significant deformity or atrophy Skin- no rash or lesion Psych- euthymic mood, full affect Neuro- strength and sensation are intact  EKG-afib at  74 bpm, qrs int 84 ms, qtc 381 ms( qtc in SR in 2018, 402 ms) Epic records reviewed Echo-Study Conclusions  - Left ventricle: The cavity size was moderately dilated. Systolic   function was severely reduced. The estimated ejection fraction   was in the range of 20% to 25%. Diffuse hypokinesis. Although no   diagnostic regional wall motion abnormality was identified, this   possibility cannot be completely excluded on the basis of this   study. - Mitral valve: There was mild regurgitation. - Left atrium: The atrium was mildly dilated. - Right ventricle: The cavity size was mildly dilated. Wall   thickness was normal. Systolic function was mildly reduced. - Right atrium: The atrium was mildly dilated.  Impressions:  - Dilated LV with severely reduced function. No LV thrombus seen   with use of echo contrast.   Assessment and Plan: 1. Persistent afib Failed Tikosyn  Now loading on amiodarone as a bridge to ablation Feels better rate controlled, but continues in afib Ablation scheduled 1/30 Continue atenolol at 50 mg bid Has been on 400 mg bid, will now reduce to 200 mg bid and then to 200 mg daily No missed anticoagulation Continue eliquis 5 mg bid for a chadsvasc score of 1 qtc  acceptable on amiodarone  F/u with Dr. Johney FrameAllred 1/8   Elvina Sidleonna C. Matthew Folksarroll, ANP-C [facility] [address] [place], [place] [address] [phone]

## 2018-12-21 NOTE — Patient Instructions (Signed)
Reduce amiodarone to 200 mg twice a day

## 2019-01-02 ENCOUNTER — Ambulatory Visit (INDEPENDENT_AMBULATORY_CARE_PROVIDER_SITE_OTHER): Payer: 59 | Admitting: Internal Medicine

## 2019-01-02 ENCOUNTER — Encounter: Payer: Self-pay | Admitting: Internal Medicine

## 2019-01-02 VITALS — BP 116/72 | HR 109 | Ht 69.0 in | Wt 247.0 lb

## 2019-01-02 DIAGNOSIS — Z01812 Encounter for preprocedural laboratory examination: Secondary | ICD-10-CM

## 2019-01-02 DIAGNOSIS — I4819 Other persistent atrial fibrillation: Secondary | ICD-10-CM

## 2019-01-02 NOTE — H&P (View-Only) (Signed)
Electrophysiology Office Note Date: 01/02/2019  ID:  [name], DOB 10-Aug-1961, MRN [id number]  PCP: [name], MD Primary Cardiologist: Dr Donnie Aho Electrophysiologist: Dr [name]  CC: Follow up for atrial fibrillation  [name] is a 58 y.o. male seen today for Dr [name].  He presents today for routine electrophysiology followup.  Since last being seen in our clinic, the patient reports doing reasonably well.  Patient admitted for dofetilide load 12/05/18. Unfortunately, he did not convert with medicine or DCCV. He was started on amiodarone and scheduled for afib/flutter ablation with Dr [name] 01/24/18. Since being on the amiodarone, he feels much better overall. He has not noted any rapid heart rates though he remains in afib.  He denies chest pain, palpitations, dyspnea, PND, orthopnea, nausea, vomiting, dizziness, syncope, edema, weight gain, or early satiety.  Past Medical History:  Diagnosis Date  . Arthritis   . Atrial flutter (HCC)    diagnosed 2005  . Depression   . Diverticular disease   . GERD (gastroesophageal reflux disease)   . Obesity   . Paroxysmal atrial fibrillation (HCC)   . Primary localized osteoarthritis of left knee    Past Surgical History:  Procedure Laterality Date  . CARDIOVERSION N/A 12/06/2018   Procedure: CARDIOVERSION;  Surgeon: Parke Poisson, MD;  Location: [facility] ENDOSCOPY;  Service: Cardiovascular;  Laterality: N/A;  . COLON SURGERY  12/07, 5/08, 10/08  . COLONOSCOPY    . COLOSTOMY  2007  . COLOSTOMY REVERSAL    . EYE SURGERY Bilateral    cataracts  . INCISIONAL HERNIA REPAIR  2008   with mesh  . KNEE ARTHROSCOPY Left 5/10   x2  . MULTIPLE TOOTH EXTRACTIONS    . neck fusion  5/11  . REPLANTATION THUMB Right   . TONSILLECTOMY    . TOTAL KNEE ARTHROPLASTY Left 08/23/2017  . TOTAL KNEE ARTHROPLASTY Left 08/23/2017   Procedure: LEFT TOTAL KNEE ARTHROPLASTY;  Surgeon: Salvatore Marvel, MD;  Location: [facility] OR;  Service:  Orthopedics;  Laterality: Left;  . WISDOM TOOTH EXTRACTION      Current Outpatient Medications  Medication Sig Dispense Refill  . acetaminophen (TYLENOL) 650 MG CR tablet Take 1,300 mg by mouth 2 (two) times daily.    Marland Kitchen amiodarone (PACERONE) 200 MG tablet Take 1 tablet (200 mg total) by mouth 2 (two) times daily. 60 tablet 3  . apixaban (ELIQUIS) 5 MG TABS tablet Take 1 tablet (5 mg total) by mouth 2 (two) times daily. 60 tablet 11  . atenolol (TENORMIN) 50 MG tablet Take 1 tablet (50 mg total) by mouth 2 (two) times daily. (BETA BLOCKER) 60 tablet 10  . diclofenac (VOLTAREN) 75 MG EC tablet Take 75 mg by mouth every other day.     . docusate sodium (COLACE) 100 MG capsule Take 100 mg by mouth daily.    Marland Kitchen gabapentin (NEURONTIN) 100 MG capsule Take 400 mg by mouth See admin instructions. 200 mg in the morning and 200 mg in the evening  5  . methocarbamol (ROBAXIN) 500 MG tablet Take 1,000 mg by mouth 3 (three) times daily.     Marland Kitchen oxycodone-acetaminophen (PERCOCET) 2.5-325 MG tablet Take 1 tablet by mouth every 8 (eight) hours as needed for pain.   0  . traMADol (ULTRAM) 50 MG tablet Take 100 mg by mouth 2 (two) times daily.      No current facility-administered medications for this visit.     Allergies:   Erythromycin; Hydrocodone-acetaminophen; Mobic [meloxicam];  Morphine and related; Prednisone; Pregabalin; Red dye; Z-pak [azithromycin]; Lopressor [metoprolol tartrate]; and Penicillins   Social History: Social History   Socioeconomic History  . Marital status: Married    Spouse name: Not on file  . Number of children: Not on file  . Years of education: Not on file  . Highest education level: Not on file  Occupational History  . Not on file  Social Needs  . Financial resource strain: Not on file  . Food insecurity:    Worry: Not on file    Inability: Not on file  . Transportation needs:    Medical: Not on file    Non-medical: Not on file  Tobacco Use  . Smoking status: Former  Smoker    Years: 2.00    Types: Cigarettes    Last attempt to quit: 08/11/2012    Years since quitting: 6.3  . Smokeless tobacco: Never Used  Substance and Sexual Activity  . Alcohol use: Yes    Alcohol/week: 14.0 standard drinks    Types: 14 Cans of beer per week    Comment: 2 beers/ day  . Drug use: No  . Sexual activity: Not on file    Comment: married ([name])  Lifestyle  . Physical activity:    Days per week: Not on file    Minutes per session: Not on file  . Stress: Not on file  Relationships  . Social connections:    Talks on phone: Not on file    Gets together: Not on file    Attends religious service: Not on file    Active member of club or organization: Not on file    Attends meetings of clubs or organizations: Not on file    Relationship status: Not on file  . Intimate partner violence:    Fear of current or ex partner: Not on file    Emotionally abused: Not on file    Physically abused: Not on file    Forced sexual activity: Not on file  Other Topics Concern  . Not on file  Social History Narrative  . Not on file    Family History: Family History  Problem Relation Age of Onset  . Hypertension Mother   . CAD Father   . Hypertension Father   . Diabetes Father   . Colon polyps Father   . CAD Sister   . Cancer Paternal Grandfather        brain and lung    Review of Systems: All other systems reviewed and are otherwise negative except as noted above.   Physical Exam: VS:  BP 116/72   Pulse (!) 109   Ht 5\' 9"  (1.753 m)   Wt 247 lb (112 kg)   SpO2 98%   BMI 36.48 kg/m  , BMI Body mass index is 36.48 kg/m. Wt Readings from Last 3 Encounters:  01/02/19 247 lb (112 kg)  12/21/18 246 lb (111.6 kg)  12/07/18 242 lb 12.8 oz (110.1 kg)    GEN- The patient is well appearing, alert and oriented x 3 today.   HEENT: normocephalic, atraumatic; sclera clear, conjunctiva pink; hearing intact; oropharynx clear; neck supple, no JVP Lymph- no cervical  lymphadenopathy Lungs- Clear to ausculation bilaterally, normal work of breathing.  No wheezes, rales, rhonchi Heart- irregular rate and rhythm, no murmurs, rubs or gallops, PMI not laterally displaced Extremities- no clubbing, cyanosis, or edema; DP/PT/radial pulses 2+ bilaterally MS- no significant deformity or atrophy Skin- warm and dry, no rash or lesion  Psych- euthymic mood, full affect Neuro- strength and sensation are intact   EKG:  EKG is ordered today. The ekg ordered today shows atrial fibrillation HR 109, QRS 100, QTc 471  Recent Labs: 12/07/2018: ALT 40; BUN 16; Creatinine, Ser 1.14; Magnesium 2.0; Potassium 4.4; Sodium 141; TSH 1.163    Other studies Reviewed: Additional studies/ records that were reviewed today include: Notes in Epic  Assessment and Plan:  1. Persistent atrial fibrillation/flutter Unfortunately, failed Tikosyn Currently on amiodarone 200 mg BID as a bridge to ablation Continue Eliquis 5 mg BID Plan for ablation 01/24/2019  Risk, benefits, and alternatives to EP study and radiofrequency ablation for afib were also discussed in detail today. These risks include but are not limited to stroke, bleeding, vascular damage, tamponade, perforation, damage to the esophagus, lungs, and other structures, pulmonary vein stenosis, worsening renal function, and death. The patient understands these risk and wishes to proceed.  We will therefore proceed with catheter ablation at the next available time.  Carto, ICE, anesthesia are requested for the procedure.  Will also obtain cardiac CT prior to the procedure to exclude LAA thrombus and further evaluate atrial anatomy.   2. Obesity Body mass index is 36.48 kg/m. Lifestyle modifications encouraged.   Current medicines are reviewed at length with the patient today.   The patient does not have concerns regarding his medicines.  The following changes were made today:  none  Labs/ tests ordered today include:  Orders  Placed This Encounter  Procedures  . CBC  . EKG 12-Lead    Hillis Range MD 01/02/2019 11:00 AM   [facility] [address] [place] [address] [phone] (office) [phone] (fax)

## 2019-01-02 NOTE — Patient Instructions (Addendum)
Medication Instructions:  Your physician recommends that you continue on your current medications as directed. Please refer to the Current Medication list given to you today. If you need a refill on your cardiac medications before your next appointment, please call your pharmacy.   Labwork: Pre procedure labs today: BMET & CBC  Testing/Procedures: Your physician has requested that you have cardiac CT. Cardiac computed tomography (CT) is a painless test that uses an x-ray machine to take clear, detailed pictures of your heart. For further information please visit [email or web address]. Please follow instruction sheet as given. You are scheduled for 01/18/19.  Please review instructions below under special instruction area  Your physician has recommended that you have an ablation. Catheter ablation is a medical procedure used to treat some cardiac arrhythmias (irregular heartbeats). During catheter ablation, a long, thin, flexible tube is put into a blood vessel in your groin (upper thigh), or neck. This tube is called an ablation catheter. It is then guided to your heart through the blood vessel. Radio frequency waves destroy small areas of heart tissue where abnormal heartbeats may cause an arrhythmia to start.  Please see the instruction below under special instructions area.   Follow-Up: You are scheduled on 02/21/19 with Rudi Cocoonna Carroll, in the AFib clinic, at 9:00 am.  You will follow up with Dr. Johney FrameAllred 3 months after your procedure on 01/24/2019.    Special instructions  CARDIAC CT INSTRUCTIONS:  Please arrive at the [facility] main entrance of [facility] at  __________ AM (30-45 minutes prior to test start time)  [facility] [address] [place], [place] [address] [phone]  Proceed to the [facility] Radiology Department (First Floor).  Please follow these instructions carefully (unless otherwise directed):  Hold all erectile dysfunction medications at  least 48 hours prior to test.  On the Night Before the Test: . Be sure to Drink plenty of water. . Do not consume any caffeinated/decaffeinated beverages or chocolate 12 hours prior to your test. . Do not take any antihistamines 12 hours prior to your test. . If you take Metformin do not take 24 hours prior to test. . If the patient has contrast allergy: ? Patient will need a prescription for Prednisone and very clear instructions (as follows): 1. Prednisone 50 mg - take 13 hours prior to test 2. Take another Prednisone 50 mg 7 hours prior to test 3. Take another Prednisone 50 mg 1 hour prior to test 4. Take Benadryl 50 mg 1 hour prior to test . Patient must complete all four doses of above prophylactic medications. . Patient will need a ride after test due to Benadryl.  On the Day of the Test: . Drink plenty of water. Do not drink any water within one hour of the test. . Do not eat any food 4 hours prior to the test. . You may take your regular medications prior to the test.  . Take your Atenolol two hours prior to this test. . HOLD Furosemide/Hydrochlorothiazide morning of the test.              After the Test: . Drink plenty of water. . After receiving IV contrast, you may experience a mild flushed feeling. This is normal. . On occasion, you may experience a mild rash up to 24 hours after the test. This is not dangerous. If this occurs, you can take Benadryl 25 mg and increase your fluid intake. . If you experience trouble breathing, this can be serious. If  it is severe call 911 IMMEDIATELY. If it is mild, please call our office.     ABLATION INSTRUCTIONS: Please arrive at the [facility] main entrance of [facility] hospital at: 5:30 am. Do not eat or drink after midnight prior to procedure On the morning of your procedure do not take any medications. Plan for one night stay.  You will need someone to drive you home at discharge.

## 2019-01-02 NOTE — Progress Notes (Signed)
   Electrophysiology Office Note Date: 01/02/2019  ID:  [name], DOB 01/04/1961, MRN [id number]  PCP: White, Cynthia, MD Primary Cardiologist: Dr Tilley Electrophysiologist: Dr Amarria Andreasen  CC: Follow up for atrial fibrillation  [name] is a 57 y.o. male seen today for Dr Solveig Fangman.  He presents today for routine electrophysiology followup.  Since last being seen in our clinic, the patient reports doing reasonably well.  Patient admitted for dofetilide load 12/05/18. Unfortunately, he did not convert with medicine or DCCV. He was started on amiodarone and scheduled for afib/flutter ablation with Dr Alexsandra Shontz 01/24/18. Since being on the amiodarone, he feels much better overall. He has not noted any rapid heart rates though he remains in afib.  He denies chest pain, palpitations, dyspnea, PND, orthopnea, nausea, vomiting, dizziness, syncope, edema, weight gain, or early satiety.  Past Medical History:  Diagnosis Date  . Arthritis   . Atrial flutter (HCC)    diagnosed 2005  . Depression   . Diverticular disease   . GERD (gastroesophageal reflux disease)   . Obesity   . Paroxysmal atrial fibrillation (HCC)   . Primary localized osteoarthritis of left knee    Past Surgical History:  Procedure Laterality Date  . CARDIOVERSION N/A 12/06/2018   Procedure: CARDIOVERSION;  Surgeon: Acharya, Gayatri A, MD;  Location: [facility];  Service: Cardiovascular;  Laterality: N/A;  . COLON SURGERY  12/07, 5/08, 10/08  . COLONOSCOPY    . COLOSTOMY  2007  . COLOSTOMY REVERSAL    . EYE SURGERY Bilateral    cataracts  . INCISIONAL HERNIA REPAIR  2008   with mesh  . KNEE ARTHROSCOPY Left 5/10   x2  . MULTIPLE TOOTH EXTRACTIONS    . neck fusion  5/11  . REPLANTATION THUMB Right   . TONSILLECTOMY    . TOTAL KNEE ARTHROPLASTY Left 08/23/2017  . TOTAL KNEE ARTHROPLASTY Left 08/23/2017   Procedure: LEFT TOTAL KNEE ARTHROPLASTY;  Surgeon: Wainer, Robert, MD;  Location: [facility] OR;  Service:  Orthopedics;  Laterality: Left;  . WISDOM TOOTH EXTRACTION      Current Outpatient Medications  Medication Sig Dispense Refill  . acetaminophen (TYLENOL) 650 MG CR tablet Take 1,300 mg by mouth 2 (two) times daily.    . amiodarone (PACERONE) 200 MG tablet Take 1 tablet (200 mg total) by mouth 2 (two) times daily. 60 tablet 3  . apixaban (ELIQUIS) 5 MG TABS tablet Take 1 tablet (5 mg total) by mouth 2 (two) times daily. 60 tablet 11  . atenolol (TENORMIN) 50 MG tablet Take 1 tablet (50 mg total) by mouth 2 (two) times daily. (BETA BLOCKER) 60 tablet 10  . diclofenac (VOLTAREN) 75 MG EC tablet Take 75 mg by mouth every other day.     . docusate sodium (COLACE) 100 MG capsule Take 100 mg by mouth daily.    . gabapentin (NEURONTIN) 100 MG capsule Take 400 mg by mouth See admin instructions. 200 mg in the morning and 200 mg in the evening  5  . methocarbamol (ROBAXIN) 500 MG tablet Take 1,000 mg by mouth 3 (three) times daily.     . oxycodone-acetaminophen (PERCOCET) 2.5-325 MG tablet Take 1 tablet by mouth every 8 (eight) hours as needed for pain.   0  . traMADol (ULTRAM) 50 MG tablet Take 100 mg by mouth 2 (two) times daily.      No current facility-administered medications for this visit.     Allergies:   Erythromycin; Hydrocodone-acetaminophen; Mobic [meloxicam];   Morphine and related; Prednisone; Pregabalin; Red dye; Z-pak [azithromycin]; Lopressor [metoprolol tartrate]; and Penicillins   Social History: Social History   Socioeconomic History  . Marital status: Married    Spouse name: Not on file  . Number of children: Not on file  . Years of education: Not on file  . Highest education level: Not on file  Occupational History  . Not on file  Social Needs  . Financial resource strain: Not on file  . Food insecurity:    Worry: Not on file    Inability: Not on file  . Transportation needs:    Medical: Not on file    Non-medical: Not on file  Tobacco Use  . Smoking status: Former  Smoker    Years: 2.00    Types: Cigarettes    Last attempt to quit: 08/11/2012    Years since quitting: 6.3  . Smokeless tobacco: Never Used  Substance and Sexual Activity  . Alcohol use: Yes    Alcohol/week: 14.0 standard drinks    Types: 14 Cans of beer per week    Comment: 2 beers/ day  . Drug use: No  . Sexual activity: Not on file    Comment: married ([name])  Lifestyle  . Physical activity:    Days per week: Not on file    Minutes per session: Not on file  . Stress: Not on file  Relationships  . Social connections:    Talks on phone: Not on file    Gets together: Not on file    Attends religious service: Not on file    Active member of club or organization: Not on file    Attends meetings of clubs or organizations: Not on file    Relationship status: Not on file  . Intimate partner violence:    Fear of current or ex partner: Not on file    Emotionally abused: Not on file    Physically abused: Not on file    Forced sexual activity: Not on file  Other Topics Concern  . Not on file  Social History Narrative  . Not on file    Family History: Family History  Problem Relation Age of Onset  . Hypertension Mother   . CAD Father   . Hypertension Father   . Diabetes Father   . Colon polyps Father   . CAD Sister   . Cancer Paternal Grandfather        brain and lung    Review of Systems: All other systems reviewed and are otherwise negative except as noted above.   Physical Exam: VS:  BP 116/72   Pulse (!) 109   Ht 5' 9" (1.753 m)   Wt 247 lb (112 kg)   SpO2 98%   BMI 36.48 kg/m  , BMI Body mass index is 36.48 kg/m. Wt Readings from Last 3 Encounters:  01/02/19 247 lb (112 kg)  12/21/18 246 lb (111.6 kg)  12/07/18 242 lb 12.8 oz (110.1 kg)    GEN- The patient is well appearing, alert and oriented x 3 today.   HEENT: normocephalic, atraumatic; sclera clear, conjunctiva pink; hearing intact; oropharynx clear; neck supple, no JVP Lymph- no cervical  lymphadenopathy Lungs- Clear to ausculation bilaterally, normal work of breathing.  No wheezes, rales, rhonchi Heart- irregular rate and rhythm, no murmurs, rubs or gallops, PMI not laterally displaced Extremities- no clubbing, cyanosis, or edema; DP/PT/radial pulses 2+ bilaterally MS- no significant deformity or atrophy Skin- warm and dry, no rash or lesion    Psych- euthymic mood, full affect Neuro- strength and sensation are intact   EKG:  EKG is ordered today. The ekg ordered today shows atrial fibrillation HR 109, QRS 100, QTc 471  Recent Labs: 12/07/2018: ALT 40; BUN 16; Creatinine, Ser 1.14; Magnesium 2.0; Potassium 4.4; Sodium 141; TSH 1.163    Other studies Reviewed: Additional studies/ records that were reviewed today include: Notes in Epic  Assessment and Plan:  1. Persistent atrial fibrillation/flutter Unfortunately, failed Tikosyn Currently on amiodarone 200 mg BID as a bridge to ablation Continue Eliquis 5 mg BID Plan for ablation 01/24/2019  Risk, benefits, and alternatives to EP study and radiofrequency ablation for afib were also discussed in detail today. These risks include but are not limited to stroke, bleeding, vascular damage, tamponade, perforation, damage to the esophagus, lungs, and other structures, pulmonary vein stenosis, worsening renal function, and death. The patient understands these risk and wishes to proceed.  We will therefore proceed with catheter ablation at the next available time.  Carto, ICE, anesthesia are requested for the procedure.  Will also obtain cardiac CT prior to the procedure to exclude LAA thrombus and further evaluate atrial anatomy.   2. Obesity Body mass index is 36.48 kg/m. Lifestyle modifications encouraged.   Current medicines are reviewed at length with the patient today.   The patient does not have concerns regarding his medicines.  The following changes were made today:  none  Labs/ tests ordered today include:  Orders  Placed This Encounter  Procedures  . CBC  . EKG 12-Lead    Hillis Range MD 01/02/2019 11:00 AM   [facility] [address] [place] [address] [phone] (office) [phone] (fax)

## 2019-01-03 LAB — CBC
Hematocrit: 48.7 % (ref 37.5–51.0)
Hemoglobin: 16.7 g/dL (ref 13.0–17.7)
MCH: 30.5 pg (ref 26.6–33.0)
MCHC: 34.3 g/dL (ref 31.5–35.7)
MCV: 89 fL (ref 79–97)
Platelets: 248 10*3/uL (ref 150–450)
RBC: 5.48 x10E6/uL (ref 4.14–5.80)
RDW: 13.8 % (ref 11.6–15.4)
WBC: 8.8 10*3/uL (ref 3.4–10.8)

## 2019-01-17 ENCOUNTER — Telehealth (HOSPITAL_COMMUNITY): Payer: Self-pay | Admitting: Emergency Medicine

## 2019-01-17 NOTE — Telephone Encounter (Signed)
Reaching out to patient to offer assistance regarding upcoming cardiac imaging study; pt verbalizes understanding of appt date/time, parking situation and where to check in, pre-test NPO status and medications ordered, and verified current allergies; name and call back number provided for further questions should they arise Takasha Vetere RN Navigator Cardiac Imaging  Heart and Vascular [phone] office [phone] cell 

## 2019-01-18 ENCOUNTER — Ambulatory Visit (HOSPITAL_COMMUNITY): Admission: RE | Admit: 2019-01-18 | Payer: 59 | Source: Ambulatory Visit

## 2019-01-18 ENCOUNTER — Ambulatory Visit (HOSPITAL_COMMUNITY)
Admission: RE | Admit: 2019-01-18 | Discharge: 2019-01-18 | Disposition: A | Discharge status: Home / Self Care | Payer: 59 | Source: Ambulatory Visit | Attending: Internal Medicine | Admitting: Internal Medicine

## 2019-01-18 DIAGNOSIS — I480 Paroxysmal atrial fibrillation: Secondary | ICD-10-CM | POA: Insufficient documentation

## 2019-01-18 MED ORDER — IOPAMIDOL (ISOVUE-370) INJECTION 76%
80.0000 mL | Freq: Once | INTRAVENOUS | Status: AC | PRN
  Administered 2019-01-18: 80 mL via INTRAVENOUS

## 2019-01-23 NOTE — Anesthesia Preprocedure Evaluation (Addendum)
Anesthesia Evaluation  Patient identified by MRN, date of birth, ID band Patient awake    Reviewed: Allergy & Precautions, H&P , NPO status , Patient's Chart, lab work & pertinent test results, reviewed documented beta blocker date and time   Airway Mallampati: II  TM Distance: >3 FB Neck ROM: Full    Dental no notable dental hx. (+) Teeth Intact, Dental Advisory Given   Pulmonary neg pulmonary ROS, former smoker,    Pulmonary exam normal breath sounds clear to auscultation       Cardiovascular Exercise Tolerance: Good hypertension, Pt. on medications and Pt. on home beta blockers + dysrhythmias Atrial Fibrillation  Rhythm:Irregular Rate:Normal     Neuro/Psych Depression negative neurological ROS     GI/Hepatic Neg liver ROS, GERD  Controlled,  Endo/Other  negative endocrine ROS  Renal/GU negative Renal ROS  negative genitourinary   Musculoskeletal  (+) Arthritis ,   Abdominal   Peds  Hematology negative hematology ROS (+)   Anesthesia Other Findings   Reproductive/Obstetrics negative OB ROS                            Anesthesia Physical Anesthesia Plan  ASA: III  Anesthesia Plan: General   Post-op Pain Management:    Induction: Intravenous  PONV Risk Score and Plan: 2 and Ondansetron, Dexamethasone and Midazolam  Airway Management Planned: Oral ETT  Additional Equipment:   Intra-op Plan:   Post-operative Plan: Extubation in OR  Informed Consent: I have reviewed the patients History and Physical, chart, labs and discussed the procedure including the risks, benefits and alternatives for the proposed anesthesia with the patient or authorized representative who has indicated his/her understanding and acceptance.     Dental advisory given  Plan Discussed with: CRNA  Anesthesia Plan Comments:        Anesthesia Quick Evaluation

## 2019-01-24 ENCOUNTER — Encounter (HOSPITAL_COMMUNITY)
Admission: RE | Disposition: A | Discharge status: Home / Self Care | Payer: Self-pay | Source: Home / Self Care | Attending: Internal Medicine

## 2019-01-24 ENCOUNTER — Ambulatory Visit (HOSPITAL_COMMUNITY): Payer: 59 | Admitting: Certified Registered"

## 2019-01-24 ENCOUNTER — Encounter (HOSPITAL_COMMUNITY): Payer: Self-pay | Admitting: Certified Registered"

## 2019-01-24 ENCOUNTER — Ambulatory Visit (HOSPITAL_COMMUNITY)
Admission: RE | Admit: 2019-01-24 | Discharge: 2019-01-24 | Disposition: A | Discharge status: Home / Self Care | Payer: 59 | Attending: Internal Medicine | Admitting: Internal Medicine

## 2019-01-24 ENCOUNTER — Other Ambulatory Visit: Payer: Self-pay

## 2019-01-24 DIAGNOSIS — Z888 Allergy status to other drugs, medicaments and biological substances status: Secondary | ICD-10-CM | POA: Insufficient documentation

## 2019-01-24 DIAGNOSIS — Q211 Atrial septal defect: Secondary | ICD-10-CM | POA: Insufficient documentation

## 2019-01-24 DIAGNOSIS — F329 Major depressive disorder, single episode, unspecified: Secondary | ICD-10-CM | POA: Insufficient documentation

## 2019-01-24 DIAGNOSIS — Z79899 Other long term (current) drug therapy: Secondary | ICD-10-CM | POA: Insufficient documentation

## 2019-01-24 DIAGNOSIS — Z87891 Personal history of nicotine dependence: Secondary | ICD-10-CM | POA: Diagnosis not present

## 2019-01-24 DIAGNOSIS — I4819 Other persistent atrial fibrillation: Secondary | ICD-10-CM

## 2019-01-24 DIAGNOSIS — I4892 Unspecified atrial flutter: Secondary | ICD-10-CM | POA: Insufficient documentation

## 2019-01-24 DIAGNOSIS — M1990 Unspecified osteoarthritis, unspecified site: Secondary | ICD-10-CM | POA: Diagnosis not present

## 2019-01-24 DIAGNOSIS — Z885 Allergy status to narcotic agent status: Secondary | ICD-10-CM | POA: Insufficient documentation

## 2019-01-24 DIAGNOSIS — Z881 Allergy status to other antibiotic agents status: Secondary | ICD-10-CM | POA: Diagnosis not present

## 2019-01-24 DIAGNOSIS — K219 Gastro-esophageal reflux disease without esophagitis: Secondary | ICD-10-CM | POA: Insufficient documentation

## 2019-01-24 DIAGNOSIS — Z6836 Body mass index (BMI) 36.0-36.9, adult: Secondary | ICD-10-CM | POA: Diagnosis not present

## 2019-01-24 DIAGNOSIS — Z7901 Long term (current) use of anticoagulants: Secondary | ICD-10-CM | POA: Diagnosis not present

## 2019-01-24 DIAGNOSIS — Z880 Allergy status to penicillin: Secondary | ICD-10-CM | POA: Diagnosis not present

## 2019-01-24 DIAGNOSIS — E669 Obesity, unspecified: Secondary | ICD-10-CM | POA: Insufficient documentation

## 2019-01-24 HISTORY — PX: ATRIAL FIBRILLATION ABLATION: EP1191

## 2019-01-24 LAB — BASIC METABOLIC PANEL
Anion gap: 10 (ref 5–15)
BUN: 19 mg/dL (ref 6–20)
CO2: 25 mmol/L (ref 22–32)
Calcium: 8.9 mg/dL (ref 8.9–10.3)
Chloride: 105 mmol/L (ref 98–111)
Creatinine, Ser: 1.03 mg/dL (ref 0.61–1.24)
GFR calc Af Amer: >60 mL/min (ref >60)
GFR calc non Af Amer: >60 mL/min (ref >60)
Glucose, Bld: 91 mg/dL (ref 70–99)
Potassium: 4.5 mmol/L (ref 3.5–5.1)
Sodium: 140 mmol/L (ref 135–145)

## 2019-01-24 LAB — POCT ACTIVATED CLOTTING TIME
Activated Clotting Time: 301 seconds
Activated Clotting Time: 318 seconds
Activated Clotting Time: 356 seconds
Activated Clotting Time: 329 seconds
Activated Clotting Time: 147 seconds

## 2019-01-24 SURGERY — ATRIAL FIBRILLATION ABLATION
Anesthesia: General

## 2019-01-24 MED ORDER — TRAMADOL HCL 50 MG PO TABS
50.0000 mg | ORAL_TABLET | Freq: Once | ORAL | Status: AC
  Administered 2019-01-24: 50 mg via ORAL
  Filled 2019-01-24: qty 1

## 2019-01-24 MED ORDER — BUPIVACAINE HCL (PF) 0.25 % IJ SOLN
INTRAMUSCULAR | Status: AC
  Filled 2019-01-24: qty 60

## 2019-01-24 MED ORDER — LIDOCAINE 2% (20 MG/ML) 5 ML SYRINGE
INTRAMUSCULAR | Status: DC | PRN
  Administered 2019-01-24: 60 mg via INTRAVENOUS
  Administered 2019-01-24: 40 mg via INTRAVENOUS

## 2019-01-24 MED ORDER — HEPARIN SODIUM (PORCINE) 1000 UNIT/ML IJ SOLN
INTRAMUSCULAR | Status: DC | PRN
  Administered 2019-01-24 (×2): 5000 [IU] via INTRAVENOUS
  Administered 2019-01-24: 4000 [IU] via INTRAVENOUS

## 2019-01-24 MED ORDER — TRAMADOL HCL 50 MG PO TABS
50.0000 mg | ORAL_TABLET | Freq: Once | ORAL | Status: AC
  Administered 2019-01-24: 50 mg via ORAL

## 2019-01-24 MED ORDER — MIDAZOLAM HCL 5 MG/5ML IJ SOLN
INTRAMUSCULAR | Status: DC | PRN
  Administered 2019-01-24: 2 mg via INTRAVENOUS

## 2019-01-24 MED ORDER — ACETAMINOPHEN 500 MG PO TABS
1000.0000 mg | ORAL_TABLET | Freq: Once | ORAL | Status: AC
  Administered 2019-01-24: 1000 mg via ORAL
  Filled 2019-01-24: qty 2

## 2019-01-24 MED ORDER — SODIUM CHLORIDE 0.9 % IV SOLN
INTRAVENOUS | Status: DC | PRN
  Administered 2019-01-24: 40 ug/min via INTRAVENOUS
  Administered 2019-01-24: 10:00:00 via INTRAVENOUS

## 2019-01-24 MED ORDER — ONDANSETRON HCL 4 MG/2ML IJ SOLN
INTRAMUSCULAR | Status: DC | PRN
  Administered 2019-01-24: 4 mg via INTRAVENOUS

## 2019-01-24 MED ORDER — TRAMADOL HCL 50 MG PO TABS
ORAL_TABLET | ORAL | Status: AC
  Administered 2019-01-24: 50 mg via ORAL
  Filled 2019-01-24: qty 1

## 2019-01-24 MED ORDER — ONDANSETRON HCL 4 MG/2ML IJ SOLN: 4.0000 mg | Freq: Four times a day (QID) | INTRAMUSCULAR | Status: DC | PRN

## 2019-01-24 MED ORDER — BUPIVACAINE HCL (PF) 0.25 % IJ SOLN
INTRAMUSCULAR | Status: DC | PRN
  Administered 2019-01-24: 10 mL

## 2019-01-24 MED ORDER — SODIUM CHLORIDE 0.9 % IV SOLN: 250.0000 mL | INTRAVENOUS | Status: DC | PRN

## 2019-01-24 MED ORDER — PANTOPRAZOLE SODIUM 40 MG PO TBEC: 40.0000 mg | DELAYED_RELEASE_TABLET | Freq: Every day | ORAL | 0 refills | Status: DC

## 2019-01-24 MED ORDER — PROPOFOL 10 MG/ML IV BOLUS
INTRAVENOUS | Status: DC | PRN
  Administered 2019-01-24: 120 mg via INTRAVENOUS
  Administered 2019-01-24: 50 mg via INTRAVENOUS

## 2019-01-24 MED ORDER — HEPARIN SODIUM (PORCINE) 1000 UNIT/ML IJ SOLN
INTRAMUSCULAR | Status: DC | PRN
  Administered 2019-01-24: 1000 [IU] via INTRAVENOUS
  Administered 2019-01-24: 9000 [IU] via INTRAVENOUS

## 2019-01-24 MED ORDER — HEPARIN (PORCINE) IN NACL 1000-0.9 UT/500ML-% IV SOLN
INTRAVENOUS | Status: AC
  Filled 2019-01-24: qty 500

## 2019-01-24 MED ORDER — HEPARIN SODIUM (PORCINE) 1000 UNIT/ML IJ SOLN
INTRAMUSCULAR | Status: AC
  Filled 2019-01-24: qty 2

## 2019-01-24 MED ORDER — SUGAMMADEX SODIUM 200 MG/2ML IV SOLN
INTRAVENOUS | Status: DC | PRN
  Administered 2019-01-24: 300 mg via INTRAVENOUS

## 2019-01-24 MED ORDER — HEPARIN SODIUM (PORCINE) 1000 UNIT/ML IJ SOLN
INTRAMUSCULAR | Status: AC
  Filled 2019-01-24: qty 1

## 2019-01-24 MED ORDER — HEPARIN (PORCINE) IN NACL 1000-0.9 UT/500ML-% IV SOLN
INTRAVENOUS | Status: DC | PRN
  Administered 2019-01-24 (×2): 500 mL

## 2019-01-24 MED ORDER — ACETAMINOPHEN 325 MG PO TABS
650.0000 mg | ORAL_TABLET | ORAL | Status: DC | PRN
  Filled 2019-01-24: qty 2

## 2019-01-24 MED ORDER — SODIUM CHLORIDE 0.9% FLUSH: 3.0000 mL | INTRAVENOUS | Status: DC | PRN

## 2019-01-24 MED ORDER — SODIUM CHLORIDE 0.9 % IV SOLN
INTRAVENOUS | Status: DC
  Administered 2019-01-24 (×2): via INTRAVENOUS

## 2019-01-24 MED ORDER — PROTAMINE SULFATE 10 MG/ML IV SOLN
INTRAVENOUS | Status: DC | PRN
  Administered 2019-01-24: 10 mg via INTRAVENOUS
  Administered 2019-01-24: 20 mg via INTRAVENOUS
  Administered 2019-01-24: 10 mg via INTRAVENOUS

## 2019-01-24 MED ORDER — ROCURONIUM BROMIDE 50 MG/5ML IV SOSY
PREFILLED_SYRINGE | INTRAVENOUS | Status: DC | PRN
  Administered 2019-01-24 (×2): 20 mg via INTRAVENOUS
  Administered 2019-01-24: 10 mg via INTRAVENOUS
  Administered 2019-01-24 (×2): 20 mg via INTRAVENOUS
  Administered 2019-01-24: 50 mg via INTRAVENOUS

## 2019-01-24 MED ORDER — SODIUM CHLORIDE 0.9% FLUSH: 3.0000 mL | Freq: Two times a day (BID) | INTRAVENOUS | Status: DC

## 2019-01-24 MED ORDER — FENTANYL CITRATE (PF) 100 MCG/2ML IJ SOLN
INTRAMUSCULAR | Status: DC | PRN
  Administered 2019-01-24 (×4): 50 ug via INTRAVENOUS

## 2019-01-24 MED ORDER — APIXABAN 5 MG PO TABS
5.0000 mg | ORAL_TABLET | Freq: Once | ORAL | Status: AC
  Administered 2019-01-24: 5 mg via ORAL
  Filled 2019-01-24: qty 1

## 2019-01-24 MED ORDER — PHENYLEPHRINE 40 MCG/ML (10ML) SYRINGE FOR IV PUSH (FOR BLOOD PRESSURE SUPPORT)
PREFILLED_SYRINGE | INTRAVENOUS | Status: DC | PRN
  Administered 2019-01-24 (×5): 80 ug via INTRAVENOUS

## 2019-01-24 SURGICAL SUPPLY — 15 items
CATH MAPPNG PENTARAY F 2-6-2MM (CATHETERS) IMPLANT
CATH NAVISTAR SMARTTOUCH DF (ABLATOR) ×2 IMPLANT
CATH SOUNDSTAR ECO REPROCESSED (CATHETERS) ×2 IMPLANT
CATH WEBSTER BI DIR CS D-F CRV (CATHETERS) ×1 IMPLANT
PACK EP LATEX FREE (CUSTOM PROCEDURE TRAY) ×2
PACK EP LF (CUSTOM PROCEDURE TRAY) ×1 IMPLANT
PAD PRO RADIOLUCENT 2001M-C (PAD) ×2 IMPLANT
PATCH CARTO3 (PAD) ×1 IMPLANT
PENTARAY F 2-6-2MM (CATHETERS) ×2
SHEATH AVANTI 11F 11CM (SHEATH) ×4 IMPLANT
SHEATH BAYLIS TRANSSEPTAL 98CM (NEEDLE) ×1 IMPLANT
SHEATH CARTO VIZIGO SM CVD (SHEATH) ×1 IMPLANT
SHEATH PINNACLE 7F 10CM (SHEATH) ×2 IMPLANT
SHEATH PINNACLE 9F 10CM (SHEATH) ×1 IMPLANT
TUBING SMART ABLATE COOLFLOW (TUBING) ×1 IMPLANT

## 2019-01-24 NOTE — Transfer of Care (Signed)
Immediate Anesthesia Transfer of Care Note  Patient: [name]  Procedure(s) Performed: ATRIAL FIBRILLATION ABLATION (N/A )  Patient Location: Cath Lab  Anesthesia Type:General  Level of Consciousness: awake and patient cooperative  Airway & Oxygen Therapy: Patient Spontanous Breathing and Patient connected to face mask oxygen  Post-op Assessment: Report given to RN, Post -op Vital signs reviewed and stable and Patient moving all extremities X 4  Post vital signs: Reviewed and stable  Last Vitals:  Vitals Value Taken Time  BP 137/83 01/24/2019 11:23 AM  Temp    Pulse 71 01/24/2019 11:23 AM  Resp 14 01/24/2019 11:23 AM  SpO2 95 % 01/24/2019 11:23 AM  Vitals shown include unvalidated device data.  Last Pain:  Vitals:   01/24/19 0554  TempSrc:   PainSc: 3       Patients Stated Pain Goal: 3 (01/24/19 0554)  Complications: No apparent anesthesia complications

## 2019-01-24 NOTE — Discharge Instructions (Signed)
Post procedure care instructions No driving for 4days. No lifting over 5 lbs for 1 week. No sexual activity for 1 week. You may return to work on 01/31/2019. Keep procedure site clean & dry. If you notice increased pain, swelling, bleeding or pus, call/return!  You may shower, but no soaking baths/hot tubs/pools for 1 week.    You have an appointment set up with the Atrial Fibrillation Clinic.  Multiple studies have shown that being followed by a dedicated atrial fibrillation clinic in addition to the standard care you receive from your other physicians improves health. We believe that enrollment in the atrial fibrillation clinic will allow [place] to better care for you.   The phone number to the Atrial Fibrillation Clinic is [phone]. The clinic is staffed Monday through Friday from 8:30am to 5pm.  Parking Directions: The clinic is located in the Heart and Vascular Building connected to [facility]. 1)From [address] turn on to [address] and go to the 3rd entrance  (Heart and Vascular entrance) on the right. 2)Look to the right for Heart &Vascular Parking Garage. 3)A code for the entrance is required please call the clinic to receive this.   4)Take the elevators to the 1st floor. Registration is in the room with the glass walls at the end of the hallway.  If you have any trouble parking or locating the clinic, please dont hesitate to call [phone].  Cardiac Ablation, Care After This sheet gives you information about how to care for yourself after your procedure. Your health care provider may also give you more specific instructions. If you have problems or questions, contact your health care provider. What can I expect after the procedure? After the procedure, it is common to have:  Bruising around your puncture site.  Tenderness around your puncture site.  Skipped heartbeats.  Tiredness (fatigue). Follow these instructions at home: Puncture site care   Follow  instructions from your health care provider about how to take care of your puncture site. Make sure you: ? Wash your hands with soap and water before you change your bandage (dressing). If soap and water are not available, use hand sanitizer. ? Change your dressing as told by your health care provider. ? Leave stitches (sutures), skin glue, or adhesive strips in place. These skin closures may need to stay in place for up to 2 weeks. If adhesive strip edges start to loosen and curl up, you may trim the loose edges. Do not remove adhesive strips completely unless your health care provider tells you to do that.  Check your puncture site every day for signs of infection. Check for: ? Redness, swelling, or pain. ? Fluid or blood. If your puncture site starts to bleed, lie down on your back, apply firm pressure to the area, and contact your health care provider. ? Warmth. ? Pus or a bad smell. Driving  Ask your health care provider when it is safe for you to drive again after the procedure.  Do not drive or use heavy machinery while taking prescription pain medicine.  Do not drive for 24 hours if you were given a medicine to help you relax (sedative) during your procedure. Activity  Avoid activities that take a lot of effort for at least 3 days after your procedure.  Do not lift anything that is heavier than 10 lb (4.5 kg), or the limit that you are told, until your health care provider says that it is safe.  Return to your normal activities  as told by your health care provider. Ask your health care provider what activities are safe for you. General instructions  Take over-the-counter and prescription medicines only as told by your health care provider.  Do not use any products that contain nicotine or tobacco, such as cigarettes and e-cigarettes. If you need help quitting, ask your health care provider.  Do not take baths, swim, or use a hot tub until your health care provider  approves.  Do not drink alcohol for 24 hours after your procedure.  Keep all follow-up visits as told by your health care provider. This is important. Contact a health care provider if:  You have redness, mild swelling, or pain around your puncture site.  You have fluid or blood coming from your puncture site that stops after applying firm pressure to the area.  Your puncture site feels warm to the touch.  You have pus or a bad smell coming from your puncture site.  You have a fever.  You have chest pain or discomfort that spreads to your neck, jaw, or arm.  You are sweating a lot.  You feel nauseous.  You have a fast or irregular heartbeat.  You have shortness of breath.  You are dizzy or light-headed and feel the need to lie down.  You have pain or numbness in the arm or leg closest to your puncture site. Get help right away if:  Your puncture site suddenly swells.  Your puncture site is bleeding and the bleeding does not stop after applying firm pressure to the area. These symptoms may represent a serious problem that is an emergency. Do not wait to see if the symptoms will go away. Get medical help right away. Call your local emergency services (911 in the [place] Do not drive yourself to the hospital. Summary  After the procedure, it is normal to have bruising and tenderness at the puncture site in your groin, neck, or forearm.  Check your puncture site every day for signs of infection.  Get help right away if your puncture site is bleeding and the bleeding does not stop after applying firm pressure to the area. This is a medical emergency. This information is not intended to replace advice given to you by your health care provider. Make sure you discuss any questions you have with your health care provider. Document Released: 03/23/2017 Document Revised: 03/23/2017 Document Reviewed: 03/23/2017 Elsevier Interactive Patient Education  2019 [employer].

## 2019-01-24 NOTE — Interval H&P Note (Signed)
History and Physical Interval Note:  01/24/2019 7:15 AM  [name]  has presented today for surgery, with the diagnosis of afib  The various methods of treatment have been discussed with the patient and family. After consideration of risks, benefits and other options for treatment, the patient has consented to  Procedure(s): ATRIAL FIBRILLATION ABLATION (N/A) as a surgical intervention .  The patient's history has been reviewed, patient examined, no change in status, stable for surgery.  I have reviewed the patient's chart and labs.  Questions were answered to the patient's satisfaction.    He reports compliance with eliquis without interruption.  Cardiac CT reviewed with him today.  Hillis Range

## 2019-01-24 NOTE — Progress Notes (Signed)
Pt states he feels bleeding.  Upon arrival to room large amount of blood noted at site.  Pressure held for 15 minutes and clean dressing applied. Renee PA notified of second bleeding episode.

## 2019-01-24 NOTE — Anesthesia Procedure Notes (Signed)
Procedure Name: Intubation Date/Time: 01/24/2019 7:55 AM Performed by: Julian Reil, CRNA Pre-anesthesia Checklist: Patient identified, Emergency Drugs available, Suction available and Patient being monitored Patient Re-evaluated:Patient Re-evaluated prior to induction Oxygen Delivery Method: Circle system utilized Preoxygenation: Pre-oxygenation with 100% oxygen Induction Type: IV induction Ventilation: Two handed mask ventilation required Laryngoscope Size: Miller and 3 Grade View: Grade II Tube type: Oral Tube size: 7.5 mm Number of attempts: 1 Airway Equipment and Method: Stylet Placement Confirmation: ETT inserted through vocal cords under direct vision,  positive ETCO2 and breath sounds checked- equal and bilateral Secured at: 23 cm Tube secured with: Tape Dental Injury: Teeth and Oropharynx as per pre-operative assessment  Comments: Coughing on intubation, v.c. moving.  4x4s bite block used.

## 2019-01-24 NOTE — Anesthesia Postprocedure Evaluation (Signed)
Anesthesia Post Note  Patient: [name]  Procedure(s) Performed: ATRIAL FIBRILLATION ABLATION (N/A )     Patient location during evaluation: Cath Lab Anesthesia Type: General Level of consciousness: awake and alert Pain management: pain level controlled Vital Signs Assessment: post-procedure vital signs reviewed and stable Respiratory status: spontaneous breathing, nonlabored ventilation, respiratory function stable and patient connected to nasal cannula oxygen Cardiovascular status: blood pressure returned to baseline and stable Postop Assessment: no apparent nausea or vomiting Anesthetic complications: no    Last Vitals:  Vitals:   01/24/19 1150 01/24/19 1225  BP: 102/65   Pulse: 68   Resp: 14   Temp:  36.6 C  SpO2: 93%     Last Pain:  Vitals:   01/24/19 1225  TempSrc: Oral  PainSc:                  Taquan Bralley,W. EDMOND

## 2019-01-24 NOTE — Progress Notes (Addendum)
Site area: RFV x 3 Site Prior to Removal:  Level 0 Pressure Applied For: 23 min Manual:   yes Patient Status During Pull:  stable Post Pull Site:  Level 0 Post Pull Instructions Given:  yes Post Pull Pulses Present: palpable Dressing Applied:  clear Bedrest begins @ 1210 till 1810 Comments: by Wenda Low

## 2019-01-24 NOTE — Progress Notes (Addendum)
Right groin bleeding,pressure held for 10 minutes until hemostatsis obtained, will continue to monitor

## 2019-01-24 NOTE — Progress Notes (Signed)
No bleeding or hematoma noted after ambulation 

## 2019-01-25 ENCOUNTER — Encounter (HOSPITAL_COMMUNITY): Payer: Self-pay | Admitting: Internal Medicine

## 2019-01-25 ENCOUNTER — Telehealth: Payer: Self-pay | Admitting: Internal Medicine

## 2019-01-25 NOTE — Telephone Encounter (Signed)
Encounter not needed

## 2019-01-28 DIAGNOSIS — G4730 Sleep apnea, unspecified: Secondary | ICD-10-CM

## 2019-02-21 ENCOUNTER — Ambulatory Visit (HOSPITAL_COMMUNITY)
Admission: RE | Admit: 2019-02-21 | Discharge: 2019-02-21 | Disposition: A | Discharge status: Home / Self Care | Payer: 59 | Source: Ambulatory Visit | Attending: Nurse Practitioner | Admitting: Nurse Practitioner

## 2019-02-21 ENCOUNTER — Encounter (HOSPITAL_COMMUNITY): Payer: Self-pay | Admitting: Nurse Practitioner

## 2019-02-21 VITALS — BP 130/76 | HR 121 | Ht 69.0 in | Wt 253.0 lb

## 2019-02-21 DIAGNOSIS — Z7901 Long term (current) use of anticoagulants: Secondary | ICD-10-CM | POA: Insufficient documentation

## 2019-02-21 DIAGNOSIS — Z888 Allergy status to other drugs, medicaments and biological substances status: Secondary | ICD-10-CM | POA: Diagnosis not present

## 2019-02-21 DIAGNOSIS — Z881 Allergy status to other antibiotic agents status: Secondary | ICD-10-CM | POA: Insufficient documentation

## 2019-02-21 DIAGNOSIS — I4892 Unspecified atrial flutter: Secondary | ICD-10-CM | POA: Insufficient documentation

## 2019-02-21 DIAGNOSIS — M1990 Unspecified osteoarthritis, unspecified site: Secondary | ICD-10-CM | POA: Diagnosis not present

## 2019-02-21 DIAGNOSIS — Z791 Long term (current) use of non-steroidal anti-inflammatories (NSAID): Secondary | ICD-10-CM | POA: Diagnosis not present

## 2019-02-21 DIAGNOSIS — I4819 Other persistent atrial fibrillation: Secondary | ICD-10-CM

## 2019-02-21 DIAGNOSIS — K219 Gastro-esophageal reflux disease without esophagitis: Secondary | ICD-10-CM | POA: Insufficient documentation

## 2019-02-21 DIAGNOSIS — Z87891 Personal history of nicotine dependence: Secondary | ICD-10-CM | POA: Insufficient documentation

## 2019-02-21 DIAGNOSIS — Z885 Allergy status to narcotic agent status: Secondary | ICD-10-CM | POA: Diagnosis not present

## 2019-02-21 DIAGNOSIS — Z79899 Other long term (current) drug therapy: Secondary | ICD-10-CM | POA: Insufficient documentation

## 2019-02-21 DIAGNOSIS — E669 Obesity, unspecified: Secondary | ICD-10-CM | POA: Insufficient documentation

## 2019-02-21 NOTE — H&P (View-Only) (Signed)
Primary Care Physician: [name], MD Referring Physician: Launa Grill    [name] is a 58 y.o. male with a h/o persistent afib that is in the afib clinic for f/u ablation one month ago. He failed Tikosyn, amio was started and plans for ablation were made. He reports that he feels better and sleeping better, however, EKG shows atrial flutter today.  He feels that he is going in and out by his HR readings at home.   Today, he denies symptoms of palpitations, chest pain, shortness of breath, orthopnea, PND, lower extremity edema, dizziness, presyncope, syncope, or neurologic sequela. The patient is tolerating medications without difficulties and is otherwise without complaint today.   Past Medical History:  Diagnosis Date  . Arthritis   . Atrial flutter (HCC)    diagnosed 2005  . Depression   . Diverticular disease   . GERD (gastroesophageal reflux disease)   . Obesity   . Paroxysmal atrial fibrillation (HCC)   . Primary localized osteoarthritis of left knee    Past Surgical History:  Procedure Laterality Date  . ATRIAL FIBRILLATION ABLATION N/A 01/24/2019   Procedure: ATRIAL FIBRILLATION ABLATION;  Surgeon: Hillis Range, MD;  Location: [facility];  Service: Cardiovascular;  Laterality: N/A;  . CARDIOVERSION N/A 12/06/2018   Procedure: CARDIOVERSION;  Surgeon: Parke Poisson, MD;  Location: [facility] ENDOSCOPY;  Service: Cardiovascular;  Laterality: N/A;  . COLON SURGERY  12/07, 5/08, 10/08  . COLONOSCOPY    . COLOSTOMY  2007  . COLOSTOMY REVERSAL    . EYE SURGERY Bilateral    cataracts  . INCISIONAL HERNIA REPAIR  2008   with mesh  . KNEE ARTHROSCOPY Left 5/10   x2  . MULTIPLE TOOTH EXTRACTIONS    . neck fusion  5/11  . REPLANTATION THUMB Right   . TONSILLECTOMY    . TOTAL KNEE ARTHROPLASTY Left 08/23/2017  . TOTAL KNEE ARTHROPLASTY Left 08/23/2017   Procedure: LEFT TOTAL KNEE ARTHROPLASTY;  Surgeon: Salvatore Marvel, MD;  Location: [facility] OR;  Service:  Orthopedics;  Laterality: Left;  . WISDOM TOOTH EXTRACTION      Current Outpatient Medications  Medication Sig Dispense Refill  . acetaminophen (TYLENOL) 650 MG CR tablet Take 1,300 mg by mouth See admin instructions. Take 1300 mg by mouth twice daily every other day alternating with Diclofenac    . amiodarone (PACERONE) 200 MG tablet Take 1 tablet (200 mg total) by mouth 2 (two) times daily. 60 tablet 3  . apixaban (ELIQUIS) 5 MG TABS tablet Take 1 tablet (5 mg total) by mouth 2 (two) times daily. 60 tablet 11  . atenolol (TENORMIN) 50 MG tablet Take 1 tablet (50 mg total) by mouth 2 (two) times daily. (BETA BLOCKER) 60 tablet 10  . diclofenac (VOLTAREN) 75 MG EC tablet Take 75 mg by mouth every other day.     . diltiazem (CARDIZEM) 120 MG tablet Take 60 mg by mouth daily.    Marland Kitchen gabapentin (NEURONTIN) 100 MG capsule Take 100-200 mg by mouth See admin instructions. Take 100 mg by mouth daily and take 200 mg by mouth at bedtime  5  . methocarbamol (ROBAXIN) 500 MG tablet Take 500 mg by mouth every 8 (eight) hours as needed for muscle spasms.     . pantoprazole (PROTONIX) 40 MG tablet Take 1 tablet (40 mg total) by mouth daily. 45 tablet 0  . traMADol (ULTRAM) 50 MG tablet Take 100 mg by mouth 3 (three) times daily.  No current facility-administered medications for this encounter.     Allergies  Allergen Reactions  . Erythromycin Other (See Comments)    Abdominal cramps   . Hydrocodone-Acetaminophen Other (See Comments)    Makes him feel "high"  . Mobic [Meloxicam] Other (See Comments)    Lower back and kidney pain   . Morphine And Related Other (See Comments)    Abdominal cramping  Migraine headaches  . Prednisone Hives, Anxiety and Other (See Comments)    Makes him anxious  . Pregabalin Other (See Comments)    Dry mouth and out-of-body feeling  . Z-Pak [Azithromycin] Nausea Only and Other (See Comments)    Severe stomach cramps   . Lopressor [Metoprolol Tartrate] Swelling   . Penicillins Rash and Other (See Comments)    Has patient had a PCN reaction causing immediate rash, facial/tongue/throat swelling, SOB or lightheadedness with hypotension: No Has patient had a PCN reaction causing severe rash involving mucus membranes or skin necrosis: No Has patient had a PCN reaction that required hospitalization: No Has patient had a PCN reaction occurring within the last 10 years: No If all of the above answers are "NO", then may proceed with Cephalosporin use.      Social History   Socioeconomic History  . Marital status: Married    Spouse name: Not on file  . Number of children: Not on file  . Years of education: Not on file  . Highest education level: Not on file  Occupational History  . Not on file  Social Needs  . Financial resource strain: Not on file  . Food insecurity:    Worry: Not on file    Inability: Not on file  . Transportation needs:    Medical: Not on file    Non-medical: Not on file  Tobacco Use  . Smoking status: Former Smoker    Years: 2.00    Types: Cigarettes    Last attempt to quit: 08/11/2012    Years since quitting: 6.5  . Smokeless tobacco: Never Used  Substance and Sexual Activity  . Alcohol use: Yes    Alcohol/week: 14.0 standard drinks    Types: 14 Cans of beer per week    Comment: 2 beers/ day  . Drug use: No  . Sexual activity: Not on file    Comment: married ([name])  Lifestyle  . Physical activity:    Days per week: Not on file    Minutes per session: Not on file  . Stress: Not on file  Relationships  . Social connections:    Talks on phone: Not on file    Gets together: Not on file    Attends religious service: Not on file    Active member of club or organization: Not on file    Attends meetings of clubs or organizations: Not on file    Relationship status: Not on file  . Intimate partner violence:    Fear of current or ex partner: Not on file    Emotionally abused: Not on file    Physically abused:  Not on file    Forced sexual activity: Not on file  Other Topics Concern  . Not on file  Social History Narrative  . Not on file    Family History  Problem Relation Age of Onset  . Hypertension Mother   . CAD Father   . Hypertension Father   . Diabetes Father   . Colon polyps Father   . CAD Sister   .  Cancer Paternal Grandfather        brain and lung    ROS- All systems are reviewed and negative except as per the HPI above  Physical Exam: Vitals:   02/21/19 0849  BP: 130/76  Pulse: (!) 121  Weight: 114.8 kg  Height: 5\' 9"  (1.753 m)   Wt Readings from Last 3 Encounters:  02/21/19 114.8 kg  01/24/19 108.9 kg  01/02/19 112 kg    Labs: Lab Results  Component Value Date   NA 140 01/24/2019   K 4.5 01/24/2019   CL 105 01/24/2019   CO2 25 01/24/2019   GLUCOSE 91 01/24/2019   BUN 19 01/24/2019   CREATININE 1.03 01/24/2019   CALCIUM 8.9 01/24/2019   PHOS 3.5 11/01/2007   MG 2.0 12/07/2018   Lab Results  Component Value Date   INR 0.95 11/18/2014   Lab Results  Component Value Date   CHOL  10/29/2007    100        ATP III CLASSIFICATION:  <200     mg/dL   Desirable  315-945  mg/dL   Borderline High  >=859    mg/dL   High   TRIG 292 44/62/8638     GEN- The patient is well appearing, alert and oriented x 3 today.   Head- normocephalic, atraumatic Eyes-  Sclera clear, conjunctiva pink Ears- hearing intact Oropharynx- clear Neck- supple, no JVP Lymph- no cervical lymphadenopathy Lungs- Clear to ausculation bilaterally, normal work of breathing Heart- regular rate and rhythm, no murmurs, rubs or gallops, PMI not laterally displaced GI- soft, NT, ND, + BS Extremities- no clubbing, cyanosis, or edema MS- no significant deformity or atrophy Skin- no rash or lesion Psych- euthymic mood, full affect Neuro- strength and sensation are intact  EKG-atypical atrial flutter at 121 bpm,  qrs int 94  ms, qtc 505 ms  Epic records reviewed Echo-Study Conclusions   - Left ventricle: The cavity size was moderately dilated. Systolic   function was severely reduced. The estimated ejection fraction   was in the range of 20% to 25%. Diffuse hypokinesis. Although no   diagnostic regional wall motion abnormality was identified, this   possibility cannot be completely excluded on the basis of this   study. - Mitral valve: There was mild regurgitation. - Left atrium: The atrium was mildly dilated. - Right ventricle: The cavity size was mildly dilated. Wall   thickness was normal. Systolic function was mildly reduced. - Right atrium: The atrium was mildly dilated.  Impressions:  - Dilated LV with severely reduced function. No LV thrombus seen   with use of echo contrast.   Assessment and Plan: 1. Persistent afib Failed Tikosyn  On amiodarone 200 mg bid Unfortunately he is in atrial flutter, he is not convinced that this is persistent, has HR's in the 50-60's in the am Will place a 5 day Zio patch, if persistent will plan on cardioversion Continue atenolol at 50 mg bid Will plan on reducing dose of amiodarone to 200 mg daily when it is determined if he needs cardioversion No missed anticoagulation Continue eliquis 5 mg bid for a chadsvasc score of 1   F/u will be dependant on monitor results   Lupita Leash C. Matthew Folks [facility] [address] [place], [place] [address] [phone]

## 2019-02-21 NOTE — Progress Notes (Signed)
Primary Care Physician: [name], MD Referring Physician: Launa Grill    [name] is a 58 y.o. male with a h/o persistent afib that is in the afib clinic for f/u ablation one month ago. He failed Tikosyn, amio was started and plans for ablation were made. He reports that he feels better and sleeping better, however, EKG shows atrial flutter today.  He feels that he is going in and out by his HR readings at home.   Today, he denies symptoms of palpitations, chest pain, shortness of breath, orthopnea, PND, lower extremity edema, dizziness, presyncope, syncope, or neurologic sequela. The patient is tolerating medications without difficulties and is otherwise without complaint today.   Past Medical History:  Diagnosis Date  . Arthritis   . Atrial flutter (HCC)    diagnosed 2005  . Depression   . Diverticular disease   . GERD (gastroesophageal reflux disease)   . Obesity   . Paroxysmal atrial fibrillation (HCC)   . Primary localized osteoarthritis of left knee    Past Surgical History:  Procedure Laterality Date  . ATRIAL FIBRILLATION ABLATION N/A 01/24/2019   Procedure: ATRIAL FIBRILLATION ABLATION;  Surgeon: Hillis Range, MD;  Location: [facility];  Service: Cardiovascular;  Laterality: N/A;  . CARDIOVERSION N/A 12/06/2018   Procedure: CARDIOVERSION;  Surgeon: Parke Poisson, MD;  Location: [facility] ENDOSCOPY;  Service: Cardiovascular;  Laterality: N/A;  . COLON SURGERY  12/07, 5/08, 10/08  . COLONOSCOPY    . COLOSTOMY  2007  . COLOSTOMY REVERSAL    . EYE SURGERY Bilateral    cataracts  . INCISIONAL HERNIA REPAIR  2008   with mesh  . KNEE ARTHROSCOPY Left 5/10   x2  . MULTIPLE TOOTH EXTRACTIONS    . neck fusion  5/11  . REPLANTATION THUMB Right   . TONSILLECTOMY    . TOTAL KNEE ARTHROPLASTY Left 08/23/2017  . TOTAL KNEE ARTHROPLASTY Left 08/23/2017   Procedure: LEFT TOTAL KNEE ARTHROPLASTY;  Surgeon: Salvatore Marvel, MD;  Location: [facility] OR;  Service:  Orthopedics;  Laterality: Left;  . WISDOM TOOTH EXTRACTION      Current Outpatient Medications  Medication Sig Dispense Refill  . acetaminophen (TYLENOL) 650 MG CR tablet Take 1,300 mg by mouth See admin instructions. Take 1300 mg by mouth twice daily every other day alternating with Diclofenac    . amiodarone (PACERONE) 200 MG tablet Take 1 tablet (200 mg total) by mouth 2 (two) times daily. 60 tablet 3  . apixaban (ELIQUIS) 5 MG TABS tablet Take 1 tablet (5 mg total) by mouth 2 (two) times daily. 60 tablet 11  . atenolol (TENORMIN) 50 MG tablet Take 1 tablet (50 mg total) by mouth 2 (two) times daily. (BETA BLOCKER) 60 tablet 10  . diclofenac (VOLTAREN) 75 MG EC tablet Take 75 mg by mouth every other day.     . diltiazem (CARDIZEM) 120 MG tablet Take 60 mg by mouth daily.    Marland Kitchen gabapentin (NEURONTIN) 100 MG capsule Take 100-200 mg by mouth See admin instructions. Take 100 mg by mouth daily and take 200 mg by mouth at bedtime  5  . methocarbamol (ROBAXIN) 500 MG tablet Take 500 mg by mouth every 8 (eight) hours as needed for muscle spasms.     . pantoprazole (PROTONIX) 40 MG tablet Take 1 tablet (40 mg total) by mouth daily. 45 tablet 0  . traMADol (ULTRAM) 50 MG tablet Take 100 mg by mouth 3 (three) times daily.  No current facility-administered medications for this encounter.     Allergies  Allergen Reactions  . Erythromycin Other (See Comments)    Abdominal cramps   . Hydrocodone-Acetaminophen Other (See Comments)    Makes him feel "high"  . Mobic [Meloxicam] Other (See Comments)    Lower back and kidney pain   . Morphine And Related Other (See Comments)    Abdominal cramping  Migraine headaches  . Prednisone Hives, Anxiety and Other (See Comments)    Makes him anxious  . Pregabalin Other (See Comments)    Dry mouth and out-of-body feeling  . Z-Pak [Azithromycin] Nausea Only and Other (See Comments)    Severe stomach cramps   . Lopressor [Metoprolol Tartrate] Swelling   . Penicillins Rash and Other (See Comments)    Has patient had a PCN reaction causing immediate rash, facial/tongue/throat swelling, SOB or lightheadedness with hypotension: No Has patient had a PCN reaction causing severe rash involving mucus membranes or skin necrosis: No Has patient had a PCN reaction that required hospitalization: No Has patient had a PCN reaction occurring within the last 10 years: No If all of the above answers are "NO", then may proceed with Cephalosporin use.      Social History   Socioeconomic History  . Marital status: Married    Spouse name: Not on file  . Number of children: Not on file  . Years of education: Not on file  . Highest education level: Not on file  Occupational History  . Not on file  Social Needs  . Financial resource strain: Not on file  . Food insecurity:    Worry: Not on file    Inability: Not on file  . Transportation needs:    Medical: Not on file    Non-medical: Not on file  Tobacco Use  . Smoking status: Former Smoker    Years: 2.00    Types: Cigarettes    Last attempt to quit: 08/11/2012    Years since quitting: 6.5  . Smokeless tobacco: Never Used  Substance and Sexual Activity  . Alcohol use: Yes    Alcohol/week: 14.0 standard drinks    Types: 14 Cans of beer per week    Comment: 2 beers/ day  . Drug use: No  . Sexual activity: Not on file    Comment: married ([name])  Lifestyle  . Physical activity:    Days per week: Not on file    Minutes per session: Not on file  . Stress: Not on file  Relationships  . Social connections:    Talks on phone: Not on file    Gets together: Not on file    Attends religious service: Not on file    Active member of club or organization: Not on file    Attends meetings of clubs or organizations: Not on file    Relationship status: Not on file  . Intimate partner violence:    Fear of current or ex partner: Not on file    Emotionally abused: Not on file    Physically abused:  Not on file    Forced sexual activity: Not on file  Other Topics Concern  . Not on file  Social History Narrative  . Not on file    Family History  Problem Relation Age of Onset  . Hypertension Mother   . CAD Father   . Hypertension Father   . Diabetes Father   . Colon polyps Father   . CAD Sister   .   Cancer Paternal Grandfather        brain and lung    ROS- All systems are reviewed and negative except as per the HPI above  Physical Exam: Vitals:   02/21/19 0849  BP: 130/76  Pulse: (!) 121  Weight: 114.8 kg  Height: 5' 9" (1.753 m)   Wt Readings from Last 3 Encounters:  02/21/19 114.8 kg  01/24/19 108.9 kg  01/02/19 112 kg    Labs: Lab Results  Component Value Date   NA 140 01/24/2019   K 4.5 01/24/2019   CL 105 01/24/2019   CO2 25 01/24/2019   GLUCOSE 91 01/24/2019   BUN 19 01/24/2019   CREATININE 1.03 01/24/2019   CALCIUM 8.9 01/24/2019   PHOS 3.5 11/01/2007   MG 2.0 12/07/2018   Lab Results  Component Value Date   INR 0.95 11/18/2014   Lab Results  Component Value Date   CHOL  10/29/2007    100        ATP III CLASSIFICATION:  <200     mg/dL   Desirable  200-239  mg/dL   Borderline High  >=240    mg/dL   High   TRIG 103 11/01/2007     GEN- The patient is well appearing, alert and oriented x 3 today.   Head- normocephalic, atraumatic Eyes-  Sclera clear, conjunctiva pink Ears- hearing intact Oropharynx- clear Neck- supple, no JVP Lymph- no cervical lymphadenopathy Lungs- Clear to ausculation bilaterally, normal work of breathing Heart- regular rate and rhythm, no murmurs, rubs or gallops, PMI not laterally displaced GI- soft, NT, ND, + BS Extremities- no clubbing, cyanosis, or edema MS- no significant deformity or atrophy Skin- no rash or lesion Psych- euthymic mood, full affect Neuro- strength and sensation are intact  EKG-atypical atrial flutter at 121 bpm,  qrs int 94  ms, qtc 505 ms  Epic records reviewed Echo-Study Conclusions   - Left ventricle: The cavity size was moderately dilated. Systolic   function was severely reduced. The estimated ejection fraction   was in the range of 20% to 25%. Diffuse hypokinesis. Although no   diagnostic regional wall motion abnormality was identified, this   possibility cannot be completely excluded on the basis of this   study. - Mitral valve: There was mild regurgitation. - Left atrium: The atrium was mildly dilated. - Right ventricle: The cavity size was mildly dilated. Wall   thickness was normal. Systolic function was mildly reduced. - Right atrium: The atrium was mildly dilated.  Impressions:  - Dilated LV with severely reduced function. No LV thrombus seen   with use of echo contrast.   Assessment and Plan: 1. Persistent afib Failed Tikosyn  On amiodarone 200 mg bid Unfortunately he is in atrial flutter, he is not convinced that this is persistent, has HR's in the 50-60's in the am Will place a 5 day Zio patch, if persistent will plan on cardioversion Continue atenolol at 50 mg bid Will plan on reducing dose of amiodarone to 200 mg daily when it is determined if he needs cardioversion No missed anticoagulation Continue eliquis 5 mg bid for a chadsvasc score of 1   F/u will be dependant on monitor results   Laniesha Das C. Joyous Gleghorn, ANP-C [facility] [address] [place], [place] [address] [phone]   

## 2019-03-13 ENCOUNTER — Other Ambulatory Visit (HOSPITAL_COMMUNITY): Payer: Self-pay | Admitting: *Deleted

## 2019-03-13 ENCOUNTER — Encounter (HOSPITAL_COMMUNITY): Payer: Self-pay | Admitting: Nurse Practitioner

## 2019-03-13 ENCOUNTER — Other Ambulatory Visit: Payer: Self-pay | Admitting: Cardiovascular Disease

## 2019-03-13 ENCOUNTER — Ambulatory Visit (HOSPITAL_COMMUNITY): Payer: 59 | Admitting: Anesthesiology

## 2019-03-13 ENCOUNTER — Encounter (HOSPITAL_COMMUNITY): Payer: Self-pay

## 2019-03-13 ENCOUNTER — Other Ambulatory Visit: Payer: Self-pay

## 2019-03-13 ENCOUNTER — Encounter (HOSPITAL_COMMUNITY)
Admission: RE | Disposition: A | Discharge status: Home / Self Care | Payer: Self-pay | Source: Home / Self Care | Attending: Cardiovascular Disease

## 2019-03-13 ENCOUNTER — Ambulatory Visit (HOSPITAL_COMMUNITY)
Admission: RE | Admit: 2019-03-13 | Discharge: 2019-03-13 | Disposition: A | Discharge status: Home / Self Care | Payer: 59 | Attending: Cardiovascular Disease | Admitting: Cardiovascular Disease

## 2019-03-13 DIAGNOSIS — Z791 Long term (current) use of non-steroidal anti-inflammatories (NSAID): Secondary | ICD-10-CM | POA: Diagnosis not present

## 2019-03-13 DIAGNOSIS — I4819 Other persistent atrial fibrillation: Secondary | ICD-10-CM | POA: Diagnosis present

## 2019-03-13 DIAGNOSIS — Z7901 Long term (current) use of anticoagulants: Secondary | ICD-10-CM | POA: Diagnosis not present

## 2019-03-13 DIAGNOSIS — I4892 Unspecified atrial flutter: Secondary | ICD-10-CM | POA: Diagnosis not present

## 2019-03-13 DIAGNOSIS — K219 Gastro-esophageal reflux disease without esophagitis: Secondary | ICD-10-CM | POA: Diagnosis not present

## 2019-03-13 DIAGNOSIS — Z87891 Personal history of nicotine dependence: Secondary | ICD-10-CM | POA: Insufficient documentation

## 2019-03-13 DIAGNOSIS — Z96652 Presence of left artificial knee joint: Secondary | ICD-10-CM | POA: Insufficient documentation

## 2019-03-13 DIAGNOSIS — Z79899 Other long term (current) drug therapy: Secondary | ICD-10-CM | POA: Diagnosis not present

## 2019-03-13 HISTORY — PX: CARDIOVERSION: SHX1299

## 2019-03-13 LAB — POCT I-STAT 4, (NA,K, GLUC, HGB,HCT)
Glucose, Bld: 114 mg/dL — ABNORMAL HIGH (ref 70–99)
HCT: 45 % (ref 39.0–52.0)
Hemoglobin: 15.3 g/dL (ref 13.0–17.0)
Potassium: 4.4 mmol/L (ref 3.5–5.1)
Sodium: 141 mmol/L (ref 135–145)

## 2019-03-13 SURGERY — CARDIOVERSION
Anesthesia: General

## 2019-03-13 MED ORDER — PROPOFOL 10 MG/ML IV BOLUS
INTRAVENOUS | Status: DC | PRN
  Administered 2019-03-13: 80 mg via INTRAVENOUS

## 2019-03-13 MED ORDER — LIDOCAINE HCL (CARDIAC) PF 100 MG/5ML IV SOSY
PREFILLED_SYRINGE | INTRAVENOUS | Status: DC | PRN
  Administered 2019-03-13: 60 mg via INTRAVENOUS

## 2019-03-13 MED ORDER — DILTIAZEM HCL ER COATED BEADS 120 MG PO TB24: 120.0000 mg | ORAL_TABLET | Freq: Every day | ORAL | Status: DC

## 2019-03-13 MED ORDER — SODIUM CHLORIDE 0.9 % IV SOLN
INTRAVENOUS | Status: DC | PRN
  Administered 2019-03-13: 13:00:00 via INTRAVENOUS

## 2019-03-13 MED ORDER — AMIODARONE HCL 200 MG PO TABS: 200.0000 mg | ORAL_TABLET | Freq: Every day | ORAL | 3 refills | Status: DC

## 2019-03-13 NOTE — Transfer of Care (Signed)
Immediate Anesthesia Transfer of Care Note  Patient: [name]  Procedure(s) Performed: CARDIOVERSION (N/A )  Patient Location: Endoscopy Unit  Anesthesia Type:General  Level of Consciousness: awake, alert , oriented and patient cooperative  Airway & Oxygen Therapy: Patient Spontanous Breathing and Patient connected to nasal cannula oxygen  Post-op Assessment: Report given to RN and Post -op Vital signs reviewed and stable  Post vital signs: Reviewed and stable  Last Vitals:  Vitals Value Taken Time  BP    Temp    Pulse    Resp    SpO2      Last Pain:  Vitals:   03/13/19 1208  TempSrc: Oral  PainSc: 4          Complications: No apparent anesthesia complications

## 2019-03-13 NOTE — Interval H&P Note (Signed)
History and Physical Interval Note:  03/13/2019 12:08 PM  [name]  has presented today for surgery, with the diagnosis of flutter.  The various methods of treatment have been discussed with the patient and family. After consideration of risks, benefits and other options for treatment, the patient has consented to  Procedure(s): CARDIOVERSION (N/A) as a surgical intervention.  The patient's history has been reviewed, patient examined, no change in status, stable for surgery.  I have reviewed the patient's chart and labs.  Questions were answered to the patient's satisfaction.     Charlton Haws

## 2019-03-13 NOTE — Discharge Instructions (Signed)
Electrical Cardioversion, Care After °This sheet gives you information about how to care for yourself after your procedure. Your health care provider may also give you more specific instructions. If you have problems or questions, contact your health care provider. °What can I expect after the procedure? °After the procedure, it is common to have: °· Some redness on the skin where the shocks were given. °Follow these instructions at home: ° °· Do not drive for 24 hours if you were given a medicine to help you relax (sedative). °· Take over-the-counter and prescription medicines only as told by your health care provider. °· Ask your health care provider how to check your pulse. Check it often. °· Rest for 48 hours after the procedure or as told by your health care provider. °· Avoid or limit your caffeine use as told by your health care provider. °Contact a health care provider if: °· You feel like your heart is beating too quickly or your pulse is not regular. °· You have a serious muscle cramp that does not go away. °Get help right away if: ° °· You have discomfort in your chest. °· You are dizzy or you feel faint. °· You have trouble breathing or you are short of breath. °· Your speech is slurred. °· You have trouble moving an arm or leg on one side of your body. °· Your fingers or toes turn cold or blue. °This information is not intended to replace advice given to you by your health care provider. Make sure you discuss any questions you have with your health care provider. °Document Released: 10/02/2013 Document Revised: 07/15/2016 Document Reviewed: 06/17/2016 °Elsevier Interactive Patient Education © 2019 [employer] ° °

## 2019-03-13 NOTE — CV Procedure (Signed)
[facility]: Anesthesia:  Dr Sampson Goon Propofol On Rx eliquis no missed doses  DCC x 1 150 J converted from fib/flutter rate 114 to NSR rate 68 bpm  No immediate neurologic sequelae  [name]

## 2019-03-13 NOTE — Anesthesia Postprocedure Evaluation (Signed)
Anesthesia Post Note  Patient: [name]  Procedure(s) Performed: CARDIOVERSION (N/A )     Patient location during evaluation: Endoscopy Anesthesia Type: General Level of consciousness: awake and alert Pain management: pain level controlled Vital Signs Assessment: post-procedure vital signs reviewed and stable Respiratory status: spontaneous breathing, nonlabored ventilation and respiratory function stable Cardiovascular status: blood pressure returned to baseline and stable Postop Assessment: no apparent nausea or vomiting Anesthetic complications: no    Last Vitals:  Vitals:   03/13/19 1300 03/13/19 1331  BP: (!) 99/58 106/65  Pulse: (!) 58 66  Resp: 18   Temp:    SpO2: 97% 97%    Last Pain:  Vitals:   03/13/19 1300  TempSrc: Oral  PainSc: 0-No pain                 [name],W. EDMOND

## 2019-03-13 NOTE — Anesthesia Preprocedure Evaluation (Addendum)
Anesthesia Evaluation  Patient identified by MRN, date of birth, ID band Patient awake    Reviewed: Allergy & Precautions, H&P , NPO status , Patient's Chart, lab work & pertinent test results, reviewed documented beta blocker date and time   Airway Mallampati: II  TM Distance: >3 FB Neck ROM: Full    Dental no notable dental hx. (+) Teeth Intact, Dental Advisory Given   Pulmonary neg pulmonary ROS, former smoker,    Pulmonary exam normal breath sounds clear to auscultation       Cardiovascular + dysrhythmias Atrial Fibrillation  Rhythm:Irregular Rate:Normal     Neuro/Psych Depression negative neurological ROS  negative psych ROS   GI/Hepatic Neg liver ROS, GERD  ,  Endo/Other  negative endocrine ROS  Renal/GU negative Renal ROS  negative genitourinary   Musculoskeletal  (+) Arthritis , Osteoarthritis,    Abdominal   Peds  Hematology negative hematology ROS (+)   Anesthesia Other Findings   Reproductive/Obstetrics negative OB ROS                            Anesthesia Physical Anesthesia Plan  ASA: III  Anesthesia Plan: General   Post-op Pain Management:    Induction: Intravenous  PONV Risk Score and Plan: 2 and Treatment may vary due to age or medical condition  Airway Management Planned: Mask  Additional Equipment:   Intra-op Plan:   Post-operative Plan:   Informed Consent: I have reviewed the patients History and Physical, chart, labs and discussed the procedure including the risks, benefits and alternatives for the proposed anesthesia with the patient or authorized representative who has indicated his/her understanding and acceptance.     Dental advisory given  Plan Discussed with: CRNA  Anesthesia Plan Comments:         Anesthesia Quick Evaluation

## 2019-03-22 ENCOUNTER — Encounter (HOSPITAL_COMMUNITY): Payer: Self-pay | Admitting: *Deleted

## 2019-03-26 ENCOUNTER — Telehealth (HOSPITAL_COMMUNITY): Payer: Self-pay | Admitting: *Deleted

## 2019-03-26 ENCOUNTER — Ambulatory Visit (HOSPITAL_COMMUNITY)
Admit: 2019-03-26 | Discharge: 2019-03-26 | Disposition: A | Discharge status: Home / Self Care | Payer: Medicare Other | Attending: Nurse Practitioner | Admitting: Nurse Practitioner

## 2019-03-26 DIAGNOSIS — I4819 Other persistent atrial fibrillation: Secondary | ICD-10-CM

## 2019-03-26 NOTE — Telephone Encounter (Signed)
Pt notified that newest EKG sent showed bigeminal PAC's but NSR per Rudi Coco, NP.  Pt stated he is used to the PACs as that was what Dr. Donnie Aho had treated him with the Atenolol for.

## 2019-03-26 NOTE — Progress Notes (Signed)
Electrophysiology TeleHealth Note   Due to national recommendations of social distancing due to COVID 19,video telehealth visit is felt to be most appropriate for this patient at this time.  See MyChart message/consent below  from today for patient consent regarding telehealth for the [facility].    Date:  03/26/2019   ID:  [name], DOB 08/28/1961, MRN [id number]  Location: home   Provider location: [address] [place], [place] [address] Evaluation Performed: Follow up  PCP:  [name], MD  Primary Cardiologist: none Primary Electrophysiologist: Dr. [name]  CC: afib ablation / amiodarone cardioversion f/u   History of Present Illness: [name] is a 58 y.o. male who presents via video conferencing for a telehealth visit today.   The patient is referred by Dr.  [name]. He had ablation January 25, 2019 and had ERAF. He was loaded on amiodarone and had successful cardioversion 3/18. He has had some breakthrough afib over the weekend.He thought   may because he went form tramadol to percocet for his chronic back pain. He also increased diltiazem to 120 mg qid. His strip this am showed SR. He is much less symptomatic since the cardioversion. He is now on amiodarone  200 mg daily. No interruption in anticoagulation and reminded not to  interrupt for the  3 month recovery period. He reports a BP of 116/76 and HR of 62 and regular.  Today, he denies symptoms of palpitations, chest pain, shortness of breath, orthopnea, PND, lower extremity edema, claudication, dizziness, presyncope, syncope, bleeding, or neurologic sequela. The patient is tolerating medications without difficulties and is otherwise without complaint today.   he denies symptoms of cough, fevers, chills, or new SOB worrisome for COVID 19.     Atrial Fibrillation Risk Factors:  he does not have symptoms or diagnosis of sleep apnea. he does not have a history of rheumatic fever. he does not  have a history of alcohol use. The patient does not have a history of early familial atrial fibrillation or other arrhythmias.  he has a BMI of There is no height or weight on file to calculate BMI.. There were no vitals filed for this visit.  Past Medical History:  Diagnosis Date  . Arthritis   . Atrial flutter (HCC)    diagnosed 2005  . Depression   . Diverticular disease   . GERD (gastroesophageal reflux disease)   . Obesity   . Paroxysmal atrial fibrillation (HCC)   . Primary localized osteoarthritis of left knee    Past Surgical History:  Procedure Laterality Date  . ATRIAL FIBRILLATION ABLATION N/A 01/24/2019   Procedure: ATRIAL FIBRILLATION ABLATION;  Surgeon: Hillis Range, MD;  [facility];  Service: Cardiovascular;  Laterality: N/A;  . CARDIOVERSION N/A 12/06/2018   Procedure: CARDIOVERSION;  Surgeon: Parke Poisson, MD;  Location: [facility] ENDOSCOPY;  Service: Cardiovascular;  Laterality: N/A;  . CARDIOVERSION N/A 03/13/2019   Procedure: CARDIOVERSION;  Surgeon: Wendall Stade, MD;  Location: [facility] ENDOSCOPY;  Service: Cardiovascular;  Laterality: N/A;  . COLON SURGERY  12/07, 5/08, 10/08  . COLONOSCOPY    . COLOSTOMY  2007  . COLOSTOMY REVERSAL    . EYE SURGERY Bilateral    cataracts  . INCISIONAL HERNIA REPAIR  2008   with mesh  . KNEE ARTHROSCOPY Left 5/10   x2  . MULTIPLE TOOTH EXTRACTIONS    . neck fusion  5/11  . REPLANTATION THUMB Right   . TONSILLECTOMY    .  TOTAL KNEE ARTHROPLASTY Left 08/23/2017  . TOTAL KNEE ARTHROPLASTY Left 08/23/2017   Procedure: LEFT TOTAL KNEE ARTHROPLASTY;  Surgeon: Salvatore Marvel, MD;  Location: [facility] OR;  Service: Orthopedics;  Laterality: Left;  . WISDOM TOOTH EXTRACTION       Current Outpatient Medications  Medication Sig Dispense Refill  . acetaminophen (TYLENOL) 650 MG CR tablet Take 1,300 mg by mouth every other day. Take 1300 mg by mouth twice daily every other day alternating with Diclofenac    . amiodarone  (PACERONE) 200 MG tablet Take 1 tablet (200 mg total) by mouth daily. 60 tablet 3  . apixaban (ELIQUIS) 5 MG TABS tablet Take 1 tablet (5 mg total) by mouth 2 (two) times daily. 60 tablet 11  . atenolol (TENORMIN) 50 MG tablet Take 1 tablet (50 mg total) by mouth 2 (two) times daily. (BETA BLOCKER) 60 tablet 10  . diltiazem (CARDIZEM LA) 120 MG 24 hr tablet Take 1 tablet (120 mg total) by mouth daily.    Marland Kitchen gabapentin (NEURONTIN) 100 MG capsule Take 200 mg by mouth 2 (two) times daily.   5  . methocarbamol (ROBAXIN) 500 MG tablet Take 500 mg by mouth every 8 (eight) hours as needed for muscle spasms.     . Multiple Vitamin (MULTIVITAMIN WITH MINERALS) TABS tablet Take 1 tablet by mouth daily.    Marland Kitchen oxyCODONE-acetaminophen (PERCOCET/ROXICET) 5-325 MG tablet Take 1 tablet by mouth every 4 (four) hours as needed for severe pain.    . pantoprazole (PROTONIX) 40 MG tablet Take 1 tablet (40 mg total) by mouth daily. (Patient not taking: Reported on 03/07/2019) 45 tablet 0  . traMADol (ULTRAM) 50 MG tablet Take 100 mg by mouth every 6 (six) hours as needed for moderate pain.      No current facility-administered medications for this encounter.     Allergies:   Erythromycin; Hydrocodone-acetaminophen; Mobic [meloxicam]; Morphine and related; Prednisone; Pregabalin; Z-pak [azithromycin]; Lopressor [metoprolol tartrate]; and Penicillins   Social History:  The patient  reports that he quit smoking about 6 years ago. His smoking use included cigarettes. He quit after 2.00 years of use. He has never used smokeless tobacco. He reports current alcohol use of about 14.0 standard drinks of alcohol per week. He reports that he does not use drugs.   Family History:  The patient's  family history includes CAD in his father and sister; Cancer in his paternal grandfather; Colon polyps in his father; Diabetes in his father; Hypertension in his father and mother.    ROS:  Please see the history of present illness.   All  other systems are personally reviewed and negative.   Exam: Well appearing, alert and conversant, regular work of breathing,  good skin color  Recent Labs: 12/07/2018: ALT 40; Magnesium 2.0; TSH 1.163 01/02/2019: Platelets 248 01/24/2019: BUN 19; Creatinine, Ser 1.03 03/13/2019: Hemoglobin 15.3; Potassium 4.4; Sodium 141  personally reviewed    Other studies personally reviewed: Epic records reviewed  The patient presents wearable device technology report for my review today. On my review, the patient presents  Kardia tracings from this am The tracings reveal SR    ASSESSMENT AND PLAN:  1.  Paroxsymal  atrial fibrillation S/p ablation/laoding of amiodarone and successful cardioversion. He still is having some afib intermittently. Strip this am shows SR  He is now on amiodarone 200 mg daily Continue  BB at current dose I have asked him to reduce cardizem to 120 mg bid and an extra 60 mg mid  day if he sees elevated HR's I am afraid he may get  LLE on higher levels of CCB Continue eliquis without any interruption for the 3 month healing period    This patients CHA2DS2-VASc Score and unadjusted Ischemic Stroke Rate (% per year) is equal to 0.6 % stroke rate/year from a score of 1 (LV dysfunction)  Above score calculated as 1 point each if present [CHF, HTN, DM, Vascular=MI/PAD/Aortic Plaque, Age if 65-74, or Male] Above score calculated as 2 points each if present [Age > 75, or Stroke/TIA/TE]   COVID screen The patient does not have any symptoms that suggest any further testing/ screening at this time.  Social distancing reinforced today.   Follow-up:  With Dr. [name] 4/29  Current medicines are reviewed at length with the patient today.   The patient does not have concerns regarding his medicines.  The following changes were made today:  none  Labs/ tests ordered today include: none No orders of the defined types were placed in this encounter.   Patient Risk:  after  full review of this patients clinical status, I feel that they are at moderate risk at this time.   Today, I have spent24minutes with the patient with telehealth technology discussing afib   Signed, Rudi Coco NP  03/26/2019 9:21 AM  Af[facility] Clinic [facility] [address] [place], [place] [address] [phone]   I hereby voluntarily request, consent and authorize the [facility] Clinic and its employed or contracted physicians, physician assistants, nurse practitioners or other licensed health care professionals (the Practitioner), to provide me with telemedicine health care services (the "Services") as deemed necessary by the treating Practitioner. I acknowledge and consent to receive the Services by the Practitioner via telemedicine. I understand that the telemedicine visit will involve communicating with the Practitioner through live audiovisual communication technology and the disclosure of certain medical information by electronic transmission. I acknowledge that I have been given the opportunity to request an in-person assessment or other available alternative prior to the telemedicine visit and am voluntarily participating in the telemedicine visit.   I understand that I have the right to withhold or withdraw my consent to the use of telemedicine in the course of my care at any time, without affecting my right to future care or treatment, and that the Practitioner or I may terminate the telemedicine visit at any time. I understand that I have the right to inspect all information obtained and/or recorded in the course of the telemedicine visit and may receive copies of available information for a reasonable fee.  I understand that some of the potential risks of receiving the Services via telemedicine include:   Delay or interruption in medical evaluation due to technological equipment failure or disruption;  Information transmitted may not be sufficient (e.g. poor  resolution of images) to allow for appropriate medical decision making by the Practitioner; and/or  In rare instances, security protocols could fail, causing a breach of personal health information.   Furthermore, I acknowledge that it is my responsibility to provide information about my medical history, conditions and care that is complete and accurate to the best of my ability. I acknowledge that Practitioner's advice, recommendations, and/or decision may be based on factors not within their control, such as incomplete or inaccurate data provided by me or distortions of diagnostic images or specimens that may result from electronic transmissions. I understand that the practice of medicine is not an exact science and that Practitioner makes no warranties or  guarantees regarding treatment outcomes. I acknowledge that I will receive a copy of this consent concurrently upon execution via email to the email address I last provided but may also request a printed copy by calling the office of the [facility].  I understand that my insurance will be billed for this visit.   I have read or had this consent read to me.  I understand the contents of this consent, which adequately explains the benefits and risks of the Services being provided via telemedicine.  I have been provided ample opportunity to ask questions regarding this consent and the Services and have had my questions answered to my satisfaction.  I give my informed consent for the services to be provided through the use of telemedicine in my medical care  By participating in this telemedicine visit I agree to the above.

## 2019-04-22 ENCOUNTER — Telehealth: Payer: Self-pay

## 2019-04-22 ENCOUNTER — Other Ambulatory Visit (HOSPITAL_COMMUNITY): Payer: Self-pay | Admitting: *Deleted

## 2019-04-22 MED ORDER — AMIODARONE HCL 200 MG PO TABS: 200.0000 mg | ORAL_TABLET | Freq: Every day | ORAL | 0 refills | Status: DC

## 2019-04-22 NOTE — Telephone Encounter (Signed)
Spoke with pt regarding appt on 04/24/19. Pt stated he will upload EKG and check vitals prior to appt. Pt questions and concerns were address.

## 2019-04-24 ENCOUNTER — Telehealth (INDEPENDENT_AMBULATORY_CARE_PROVIDER_SITE_OTHER): Payer: 59 | Admitting: Internal Medicine

## 2019-04-24 ENCOUNTER — Encounter (HOSPITAL_BASED_OUTPATIENT_CLINIC_OR_DEPARTMENT_OTHER): Payer: 59

## 2019-04-24 ENCOUNTER — Other Ambulatory Visit: Payer: Self-pay

## 2019-04-24 VITALS — BP 114/79

## 2019-04-24 DIAGNOSIS — I4819 Other persistent atrial fibrillation: Secondary | ICD-10-CM | POA: Diagnosis not present

## 2019-04-24 DIAGNOSIS — E663 Overweight: Secondary | ICD-10-CM | POA: Diagnosis not present

## 2019-04-24 DIAGNOSIS — I480 Paroxysmal atrial fibrillation: Secondary | ICD-10-CM | POA: Diagnosis not present

## 2019-04-24 NOTE — Progress Notes (Signed)
Electrophysiology TeleHealth Note   Due to national recommendations of social distancing due to COVID 19, an audio/video telehealth visit is felt to be most appropriate for this patient at this time.  See MyChart message from today for the patient's consent to telehealth for [facility].   Date:  04/24/2019   ID:  [name], DOB [birth year], MRN [id number]  Location: patient's home  Provider location: [address] [place]  Evaluation Performed: Follow-up visit  PCP:  [name], MD   Electrophysiologist:  Dr [name]  Chief Complaint:  afib  History of Present Illness:    [name] is a 58 y.o. male who presents via audio/video conferencing for a telehealth visit today.  Since last being seen in our clinic, the patient reports doing very well.  He continues to have ERAF post ablation.  He feels that his heart rates are mostly elevated in the morning.  V rates are mostly 120s in the am.  After taking medicines, his heart rates will return to normal mid day.  Today, he denies symptoms of chest pain, shortness of breath,  lower extremity edema, dizziness, presyncope, or syncope.  The patient is otherwise without complaint today.  The patient denies symptoms of fevers, chills, cough, or new SOB worrisome for COVID 19.  Past Medical History:  Diagnosis Date  . Arthritis   . Atrial flutter (HCC)    diagnosed 2005  . Depression   . Diverticular disease   . GERD (gastroesophageal reflux disease)   . Obesity   . Paroxysmal atrial fibrillation (HCC)   . Primary localized osteoarthritis of left knee     Past Surgical History:  Procedure Laterality Date  . ATRIAL FIBRILLATION ABLATION N/A 01/24/2019   Procedure: ATRIAL FIBRILLATION ABLATION;  Surgeon: Hillis Range, MD;  Location: [facility] INVASIVE CV LAB;  Service: Cardiovascular;  Laterality: N/A;  . CARDIOVERSION N/A 12/06/2018   Procedure: CARDIOVERSION;  Surgeon: Parke Poisson, MD;  Location: [facility]  ENDOSCOPY;  Service: Cardiovascular;  Laterality: N/A;  . CARDIOVERSION N/A 03/13/2019   Procedure: CARDIOVERSION;  Surgeon: Wendall Stade, MD;  Location: [facility] ENDOSCOPY;  Service: Cardiovascular;  Laterality: N/A;  . COLON SURGERY  12/07, 5/08, 10/08  . COLONOSCOPY    . COLOSTOMY  2007  . COLOSTOMY REVERSAL    . EYE SURGERY Bilateral    cataracts  . INCISIONAL HERNIA REPAIR  2008   with mesh  . KNEE ARTHROSCOPY Left 5/10   x2  . MULTIPLE TOOTH EXTRACTIONS    . neck fusion  5/11  . REPLANTATION THUMB Right   . TONSILLECTOMY    . TOTAL KNEE ARTHROPLASTY Left 08/23/2017  . TOTAL KNEE ARTHROPLASTY Left 08/23/2017   Procedure: LEFT TOTAL KNEE ARTHROPLASTY;  Surgeon: Salvatore Marvel, MD;  Location[facility] OR;  Service: Orthopedics;  Laterality: Left;  . WISDOM TOOTH EXTRACTION      Current Outpatient Medications  Medication Sig Dispense Refill  . acetaminophen (TYLENOL) 650 MG CR tablet Take 1,300 mg by mouth every other day. Take 1300 mg by mouth twice daily every other day alternating with Diclofenac    . amiodarone (PACERONE) 200 MG tablet Take 1 tablet (200 mg total) by mouth daily. 90 tablet 0  . apixaban (ELIQUIS) 5 MG TABS tablet Take 1 tablet (5 mg total) by mouth 2 (two) times daily. 60 tablet 11  . atenolol (TENORMIN) 50 MG tablet Take 1 tablet (50 mg total) by mouth 2 (two) times daily. (BETA BLOCKER)  60 tablet 10  . diltiazem (CARDIZEM) 120 MG tablet Take 240 mg by mouth daily.    Marland Kitchen gabapentin (NEURONTIN) 100 MG capsule Take 300 mg by mouth daily.   5  . methocarbamol (ROBAXIN) 500 MG tablet Take 500 mg by mouth every 8 (eight) hours as needed for muscle spasms.     . Multiple Vitamin (MULTIVITAMIN WITH MINERALS) TABS tablet Take 1 tablet by mouth daily.    Marland Kitchen oxyCODONE-acetaminophen (PERCOCET/ROXICET) 5-325 MG tablet Take 1 tablet by mouth every 4 (four) hours as needed for severe pain.    . traMADol (ULTRAM) 50 MG tablet Take 100 mg by mouth every 6 (six) hours as needed for  moderate pain.      No current facility-administered medications for this visit.     Allergies:   Erythromycin; Hydrocodone-acetaminophen; Mobic [meloxicam]; Morphine and related; Prednisone; Pregabalin; Z-pak [azithromycin]; Lopressor [metoprolol tartrate]; and Penicillins   Social History:  The patient  reports that he quit smoking about 6 years ago. His smoking use included cigarettes. He quit after 2.00 years of use. He has never used smokeless tobacco. He reports current alcohol use of about 14.0 standard drinks of alcohol per week. He reports that he does not use drugs.   Family History:  The patient's  family history includes CAD in his father and sister; Cancer in his paternal grandfather; Colon polyps in his father; Diabetes in his father; Hypertension in his father and mother.   ROS:  Please see the history of present illness.   All other systems are personally reviewed and negative.    Exam:    Vital Signs:  BP 114/79   Well appearing, alert and conversant, regular work of breathing,  good skin color Eyes- anicteric, neuro- grossly intact, skin- no apparent rash or lesions or cyanosis, mouth- oral mucosa is pink   Labs/Other Tests and Data Reviewed:    Recent Labs: 12/07/2018: ALT 40; Magnesium 2.0; TSH 1.163 01/02/2019: Platelets 248 01/24/2019: BUN 19; Creatinine, Ser 1.03 03/13/2019: Hemoglobin 15.3; Potassium 4.4; Sodium 141   Wt Readings from Last 3 Encounters:  03/13/19 244 lb (110.7 kg)  02/21/19 253 lb (114.8 kg)  01/24/19 240 lb (108.9 kg)     Other studies personally reviewed: Additional studies/ records that were reviewed today include: my prior office notes, AF clinic notes, procedure note  Review of the above records today demonstrates: as above Prior radiographs: cardiac CT  ASSESSMENT & PLAN:    1.  Paroxysmal atrial fibrillation ERAF post ablation   Continue amiodarone On eliquis  2. Obesity Lifestyle modification encouraged  3. COVID 19  screen The patient denies symptoms of COVID 19 at this time.  The importance of social distancing was discussed today.  Follow-up:  6 weeks in AF clinic  Current medicines are reviewed at length with the patient today.   The patient does not have concerns regarding his medicines.  The following changes were made today:  none  Labs/ tests ordered today include:  No orders of the defined types were placed in this encounter.  Patient Risk:  after full review of this patients clinical status, I feel that they are at moderate risk at this time.  Today, I have spent 20 minutes with the patient with telehealth technology discussing afib .    Randolm Idol, MD  04/24/2019 8:30 AM     [facility] [address] [place] [address] [phone] (office) [phone] (fax)

## 2019-04-30 NOTE — Telephone Encounter (Signed)
Rhythm strips from the patients Kardiamobile device 4/28 and 04/24/19 are reviewed personally.  These reveal atrial flutter and atrial fibrillation episodes.  Sinus rhythm is not observed.

## 2019-05-02 ENCOUNTER — Other Ambulatory Visit: Payer: Self-pay | Admitting: Cardiology

## 2019-06-05 ENCOUNTER — Inpatient Hospital Stay (HOSPITAL_BASED_OUTPATIENT_CLINIC_OR_DEPARTMENT_OTHER)
Admission: RE | Admit: 2019-06-05 | Discharge: 2019-06-05 | Disposition: A | Discharge status: Home / Self Care | Payer: 59 | Source: Ambulatory Visit | Attending: Nurse Practitioner | Admitting: Nurse Practitioner

## 2019-06-05 ENCOUNTER — Encounter (HOSPITAL_COMMUNITY): Payer: Self-pay | Admitting: *Deleted

## 2019-06-05 VITALS — BP 120/74 | HR 72 | Ht 69.0 in

## 2019-06-05 DIAGNOSIS — I480 Paroxysmal atrial fibrillation: Secondary | ICD-10-CM | POA: Diagnosis not present

## 2019-06-05 NOTE — Progress Notes (Signed)
Electrophysiology TeleHealth Note   Due to national recommendations of social distancing due to COVID 19,video telehealth visit is felt to be most appropriate for this patient at this time.  See MyChart message/consent below  from today for patient consent regarding telehealth for the [facility].    Date:  06/05/2019   ID:  [name], DOB July 16, 1961, MRN [id number]  Location: home   Provider location: [address] [place], [place] [address] Evaluation Performed: Follow up  PCP:  [name], MD  Primary Cardiologist: none Primary Electrophysiologist: Dr. [name]  CC: afib ablation / amiodarone cardioversion f/u   History of Present Illness: [name] is a 58 y.o. male who presents via video conferencing for a telehealth visit today.   The patient is referred by Dr.  [name]. He had ablation January 25, 2019 and had ERAF. He was loaded on amiodarone and had successful cardioversion 3/18. He has had some breakthrough afib over the weekend.He thought   may because he went form tramadol to percocet for his chronic back pain. He also increased diltiazem to 120 mg qid. His strip this am showed SR. He is much less symptomatic since the cardioversion. He is now on amiodarone  200 mg daily. No interruption in anticoagulation and reminded not to  interrupt for the  3 month recovery period. He reports a BP of 116/76 and HR of 62 and regular.  F/u 6/10. He reports that he feels hs rhythm has become more regular. Strips sent this am show one when he Is in SR , the other two are SR wth PAC's. No c/o today. BP stable at 120/70.  Today, he denies symptoms of palpitations, chest pain, shortness of breath, orthopnea, PND, lower extremity edema, claudication, dizziness, presyncope, syncope, bleeding, or neurologic sequela. The patient is tolerating medications without difficulties and is otherwise without complaint today.   he denies symptoms of cough, fevers, chills, or new  SOB worrisome for COVID 19.     Atrial Fibrillation Risk Factors:  he does not have symptoms or diagnosis of sleep apnea. he does not have a history of rheumatic fever. he does not have a history of alcohol use. The patient does not have a history of early familial atrial fibrillation or other arrhythmias.  he has a BMI of Body mass index is 36.03 kg/m.Marland Kitchen There were no vitals filed for this visit.  Past Medical History:  Diagnosis Date   Arthritis    Atrial flutter (HCC)    diagnosed 2005   Depression    Diverticular disease    GERD (gastroesophageal reflux disease)    Obesity    Paroxysmal atrial fibrillation (HCC)    Primary localized osteoarthritis of left knee    Past Surgical History:  Procedure Laterality Date   ATRIAL FIBRILLATION ABLATION N/A 01/24/2019   Procedure: ATRIAL FIBRILLATION ABLATION;  Surgeon: Hillis Range, MD;  Location: [facility] INVASIVE CV LAB;  Service: Cardiovascular;  Laterality: N/A;   CARDIOVERSION N/A 12/06/2018   Procedure: CARDIOVERSION;  Surgeon: Parke Poisson, MD;  Location: [facility] ENDOSCOPY;  Service: Cardiovascular;  Laterality: N/A;   CARDIOVERSION N/A 03/13/2019   Procedure: CARDIOVERSION;  Surgeon: Wendall Stade, MD;  Location: [facility] ENDOSCOPY;  Service: Cardiovascular;  Laterality: N/A;   COLON SURGERY  12/07, 5/08, 10/08   COLONOSCOPY     COLOSTOMY  2007   COLOSTOMY REVERSAL     EYE SURGERY Bilateral    cataracts   INCISIONAL HERNIA REPAIR  2008  with mesh   KNEE ARTHROSCOPY Left 5/10   x2   MULTIPLE TOOTH EXTRACTIONS     neck fusion  5/11   REPLANTATION THUMB Right    TONSILLECTOMY     TOTAL KNEE ARTHROPLASTY Left 08/23/2017   TOTAL KNEE ARTHROPLASTY Left 08/23/2017   Procedure: LEFT TOTAL KNEE ARTHROPLASTY;  Surgeon: Salvatore MarvelWainer, Robert, MD;  Location: [facility] OR;  Service: Orthopedics;  Laterality: Left;   WISDOM TOOTH EXTRACTION       Current Outpatient Medications  Medication Sig Dispense Refill    acetaminophen (TYLENOL) 650 MG CR tablet Take 1,300 mg by mouth every other day. Take 1300 mg by mouth twice daily every other day alternating with Diclofenac     amiodarone (PACERONE) 200 MG tablet Take 1 tablet (200 mg total) by mouth daily. 90 tablet 0   apixaban (ELIQUIS) 5 MG TABS tablet Take 1 tablet (5 mg total) by mouth 2 (two) times daily. 60 tablet 11   atenolol (TENORMIN) 50 MG tablet Take 1 tablet (50 mg total) by mouth 2 (two) times daily. (BETA BLOCKER) 60 tablet 10   diltiazem (CARDIZEM) 120 MG tablet Take 240 mg by mouth daily.     methocarbamol (ROBAXIN) 500 MG tablet Take 500 mg by mouth every 8 (eight) hours as needed for muscle spasms.      Multiple Vitamin (MULTIVITAMIN WITH MINERALS) TABS tablet Take 1 tablet by mouth daily.     oxyCODONE-acetaminophen (PERCOCET/ROXICET) 5-325 MG tablet Take 1 tablet by mouth every 4 (four) hours as needed for severe pain.     traMADol (ULTRAM) 50 MG tablet Take 100 mg by mouth every 6 (six) hours as needed for moderate pain.      No current facility-administered medications for this encounter.     Allergies:   Erythromycin; Hydrocodone-acetaminophen; Mobic [meloxicam]; Morphine and related; Prednisone; Pregabalin; Z-pak [azithromycin]; Lopressor [metoprolol tartrate]; and Penicillins   Social History:  The patient  reports that he quit smoking about 6 years ago. His smoking use included cigarettes. He quit after 2.00 years of use. He has never used smokeless tobacco. He reports current alcohol use of about 14.0 standard drinks of alcohol per week. He reports that he does not use drugs.   Family History:  The patient's  family history includes CAD in his father and sister; Cancer in his paternal grandfather; Colon polyps in his father; Diabetes in his father; Hypertension in his father and mother.    ROS:  Please see the history of present illness.   All other systems are personally reviewed and negative.   Exam: Well appearing,  alert and conversant, regular work of breathing,  good skin color  Recent Labs: 12/07/2018: ALT 40; Magnesium 2.0; TSH 1.163 01/02/2019: Platelets 248 01/24/2019: BUN 19; Creatinine, Ser 1.03 03/13/2019: Hemoglobin 15.3; Potassium 4.4; Sodium 141  personally reviewed    Other studies personally reviewed: Epic records reviewed  The patient presents wearable device technology report for my review today. On my review, the patient presents  Kardia tracings from this am The tracings reveal SR    ASSESSMENT AND PLAN:  1.  Paroxsymal  atrial fibrillation S/p ablation/loading of amiodarone and successful cardioversion. He still is having some afib intermittently, but he feels it iis  much better. He may be feeling PAC's at times.. Strip this am shows SR , 2 straps show PAC's Continue  amiodarone 200 mg daily Continue CCB/ BB at current dose Continue eliquis     This patients CHA2DS2-VASc Score and unadjusted Ischemic  Stroke Rate (% per year) is equal to 0.6 % stroke rate/year from a score of 1 (LV dysfunction)  Above score calculated as 1 point each if present [CHF, HTN, DM, Vascular=MI/PAD/Aortic Plaque, Age if 65-74, or Male] Above score calculated as 2 points each if present [Age > 75, or Stroke/TIA/TE]   COVID screen The patient does not have any symptoms that suggest any further testing/ screening at this time.  Social distancing reinforced today.   Follow-up:  With Dr. Rayann Heman 4 months  Current medicines are reviewed at length with the patient today.   The patient does not have concerns regarding his medicines.  The following changes were made today:  none  Labs/ tests ordered today include: none No orders of the defined types were placed in this encounter.   Patient Risk:  after full review of this patients clinical status, I feel that they are at moderate risk at this time.   Today, I have spent 10 minutes with the patient with telehealth technology discussing afib    Signed, Roderic Palau NP  06/05/2019 9:33 AM  Af[facility] [address] [place], [place] [address] [phone]   I hereby voluntarily request, consent and authorize the [facility] and its employed or contracted physicians, physician assistants, nurse practitioners or other licensed health care professionals (the Practitioner), to provide me with telemedicine health care services (the "Services") as deemed necessary by the treating Practitioner. I acknowledge and consent to receive the Services by the Practitioner via telemedicine. I understand that the telemedicine visit will involve communicating with the Practitioner through live audiovisual communication technology and the disclosure of certain medical information by electronic transmission. I acknowledge that I have been given the opportunity to request an in-person assessment or other available alternative prior to the telemedicine visit and am voluntarily participating in the telemedicine visit.   I understand that I have the right to withhold or withdraw my consent to the use of telemedicine in the course of my care at any time, without affecting my right to future care or treatment, and that the Practitioner or I may terminate the telemedicine visit at any time. I understand that I have the right to inspect all information obtained and/or recorded in the course of the telemedicine visit and may receive copies of available information for a reasonable fee.  I understand that some of the potential risks of receiving the Services via telemedicine include:   Delay or interruption in medical evaluation due to technological equipment failure or disruption;  Information transmitted may not be sufficient (e.g. poor resolution of images) to allow for appropriate medical decision making by the Practitioner; and/or  In rare instances, security protocols could fail, causing a breach of personal health  information.   Furthermore, I acknowledge that it is my responsibility to provide information about my medical history, conditions and care that is complete and accurate to the best of my ability. I acknowledge that Practitioner's advice, recommendations, and/or decision may be based on factors not within their control, such as incomplete or inaccurate data provided by me or distortions of diagnostic images or specimens that may result from electronic transmissions. I understand that the practice of medicine is not an exact science and that Practitioner makes no warranties or guarantees regarding treatment outcomes. I acknowledge that I will receive a copy of this consent concurrently upon execution via email to the email address I last provided but may also request a printed copy by calling the  office of the [facility]illation Clinic.  I understand that my insurance will be billed for this visit.   I have read or had this consent read to me.  I understand the contents of this consent, which adequately explains the benefits and risks of the Services being provided via telemedicine.  I have been provided ample opportunity to ask questions regarding this consent and the Services and have had my questions answered to my satisfaction.  I give my informed consent for the services to be provided through the use of telemedicine in my medical care  By participating in this telemedicine visit I agree to the above.

## 2019-07-09 ENCOUNTER — Other Ambulatory Visit (HOSPITAL_COMMUNITY): Payer: Self-pay | Admitting: Nurse Practitioner

## 2019-10-08 NOTE — Progress Notes (Addendum)
PCP:  Harlan Stains, MD Primary Cardiologist: No primary care provider on file. Electrophysiologist: Dr. Blinda Leatherwood is a 58 y.o. male who presents today for regular EP follow up. He is seen for Dr. Rayann Heman.. He has a PMH of persistent afib s/p ablation 12/2018. Pt had ERAF and was loaded with amio -> DCCV. He has been mostly stable, but has continued to have breakthrough AF and AFL on occasion.  Since last being seen in our clinic, the patient reports doing very well.  He doesn't feel it when he is in afib, but thinks his episodes are few and far between. He uses [employer] daily at noon. Gets occasional "possible afib" or "inconclusive" but mostly normal. Resting HRs sit in 60s.  He is trying to lose weight. He watches his carbohydrates and fat intake.   The patient feels that he is tolerating medications without difficulties and is otherwise without complaint today.   Labwork 08/29/2019 Cr 1.11 BUN 29 Alk phos, AST, Total bili, ALT, GGT all WNL  Past Medical History:  Diagnosis Date  . Arthritis   . Atrial flutter ([place])    diagnosed 2005  . Depression   . Diverticular disease   . GERD (gastroesophageal reflux disease)   . Obesity   . Paroxysmal atrial fibrillation (HCC)   . Primary localized osteoarthritis of left knee    Past Surgical History:  Procedure Laterality Date  . ATRIAL FIBRILLATION ABLATION N/A 01/24/2019   Procedure: ATRIAL FIBRILLATION ABLATION;  Surgeon: Thompson Grayer, MD;  Location: [place] CV LAB;  Service: Cardiovascular;  Laterality: N/A;  . CARDIOVERSION N/A 12/06/2018   Procedure: CARDIOVERSION;  Surgeon: Elouise Munroe, MD;  Location: [facility] ENDOSCOPY;  Service: Cardiovascular;  Laterality: N/A;  . CARDIOVERSION N/A 03/13/2019   Procedure: CARDIOVERSION;  Surgeon: Josue Hector, MD;  Location: [facility] ENDOSCOPY;  Service: Cardiovascular;  Laterality: N/A;  . COLON SURGERY  12/07, 5/08, 10/08  . COLONOSCOPY    . COLOSTOMY  2007  . COLOSTOMY  REVERSAL    . EYE SURGERY Bilateral    cataracts  . INCISIONAL HERNIA REPAIR  2008   with mesh  . KNEE ARTHROSCOPY Left 5/10   x2  . MULTIPLE TOOTH EXTRACTIONS    . neck fusion  5/11  . REPLANTATION THUMB Right   . TONSILLECTOMY    . TOTAL KNEE ARTHROPLASTY Left 08/23/2017  . TOTAL KNEE ARTHROPLASTY Left 08/23/2017   Procedure: LEFT TOTAL KNEE ARTHROPLASTY;  Surgeon: Elsie Saas, MD;  Location: [place];  Service: Orthopedics;  Laterality: Left;  . WISDOM TOOTH EXTRACTION      Current Outpatient Medications  Medication Sig Dispense Refill  . acetaminophen (TYLENOL) 650 MG CR tablet Take 1,300 mg by mouth every other day. Take 1300 mg by mouth twice daily every other day alternating with Diclofenac    . amiodarone (PACERONE) 200 MG tablet Take 1 tablet by mouth once daily 90 tablet 2  . apixaban (ELIQUIS) 5 MG TABS tablet Take 1 tablet (5 mg total) by mouth 2 (two) times daily. 60 tablet 11  . atenolol (TENORMIN) 50 MG tablet Take 1 tablet (50 mg total) by mouth 2 (two) times daily. (BETA BLOCKER) 60 tablet 10  . diltiazem (CARDIZEM) 120 MG tablet Take 240 mg by mouth 3 (three) times daily.     Marland Kitchen gabapentin (NEURONTIN) 100 MG capsule Take 100 mg by mouth as needed.    . methocarbamol (ROBAXIN) 500 MG tablet Take 500 mg by mouth every 8 (  eight) hours as needed for muscle spasms.     . Multiple Vitamin (MULTIVITAMIN WITH MINERALS) TABS tablet Take 1 tablet by mouth daily.    . traMADol (ULTRAM) 50 MG tablet Take 100 mg by mouth every 6 (six) hours as needed for moderate pain.      No current facility-administered medications for this visit.     Allergies  Allergen Reactions  . Erythromycin Other (See Comments)    Abdominal cramps   . Hydrocodone-Acetaminophen Other (See Comments)    Makes him feel "high"  . Mobic [Meloxicam] Other (See Comments)    Lower back and kidney pain   . Morphine And Related Other (See Comments)    Abdominal cramping  Migraine headaches  . Prednisone  Hives, Anxiety and Other (See Comments)    Makes him anxious  . Pregabalin Other (See Comments)    Dry mouth and out-of-body feeling  . Z-Pak [Azithromycin] Nausea Only and Other (See Comments)    Severe stomach cramps   . Lopressor [Metoprolol Tartrate] Swelling  . Penicillins Rash and Other (See Comments)    Has patient had a PCN reaction causing immediate rash, facial/tongue/throat swelling, SOB or lightheadedness with hypotension: No Has patient had a PCN reaction causing severe rash involving mucus membranes or skin necrosis: No Has patient had a PCN reaction that required hospitalization: No Has patient had a PCN reaction occurring within the last 10 years: No If all of the above answers are "NO", then may proceed with Cephalosporin use.      Social History   Socioeconomic History  . Marital status: Married    Spouse name: Not on file  . Number of children: Not on file  . Years of education: Not on file  . Highest education level: Not on file  Occupational History  . Not on file  Social Needs  . Financial resource strain: Not on file  . Food insecurity    Worry: Not on file    Inability: Not on file  . Transportation needs    Medical: Not on file    Non-medical: Not on file  Tobacco Use  . Smoking status: Former Smoker    Years: 2.00    Types: Cigarettes    Quit date: 08/11/2012    Years since quitting: 7.1  . Smokeless tobacco: Never Used  Substance and Sexual Activity  . Alcohol use: Yes    Alcohol/week: 14.0 standard drinks    Types: 14 Cans of beer per week    Comment: 2 beers/ day  . Drug use: No  . Sexual activity: Not on file    Comment: married ([name])  Lifestyle  . Physical activity    Days per week: Not on file    Minutes per session: Not on file  . Stress: Not on file  Relationships  . Social Herbalist on phone: Not on file    Gets together: Not on file    Attends religious service: Not on file    Active member of club or  organization: Not on file    Attends meetings of clubs or organizations: Not on file    Relationship status: Not on file  . Intimate partner violence    Fear of current or ex partner: Not on file    Emotionally abused: Not on file    Physically abused: Not on file    Forced sexual activity: Not on file  Other Topics Concern  . Not on file  Social  History Narrative  . Not on file     Review of Systems: General: No chills, fever, night sweats or weight changes  Cardiovascular:  No chest pain, dyspnea on exertion, edema, orthopnea, palpitations, paroxysmal nocturnal dyspnea Dermatological: No rash, lesions or masses Respiratory: No cough, dyspnea Urologic: No hematuria, dysuria Abdominal: No nausea, vomiting, diarrhea, bright red blood per rectum, melena, or hematemesis Neurologic: No visual changes, weakness, changes in mental status All other systems reviewed and are otherwise negative except as noted above.  Physical Exam: Vitals:   10/09/19 1107  BP: 118/74  Pulse: 62  Weight: 253 lb (114.8 kg)  Height: '5\' 9"'  (1.753 m)    GEN- The patient is well appearing, alert and oriented x 3 today.   HEENT: normocephalic, atraumatic; sclera clear, conjunctiva pink; hearing intact; oropharynx clear; neck supple, no JVP Lymph- no cervical lymphadenopathy Lungs- Clear to ausculation bilaterally, normal work of breathing.  No wheezes, rales, rhonchi Heart- Regular rate and rhythm, no murmurs, rubs or gallops, PMI not laterally displaced GI- soft, non-tender, non-distended, bowel sounds present, no hepatosplenomegaly Extremities- no clubbing, cyanosis, or edema; DP/PT/radial pulses 2+ bilaterally MS- no significant deformity or atrophy Skin- warm and dry, no rash or lesion Psych- euthymic mood, full affect Neuro- strength and sensation are intact  EKG is not ordered today. Personal review of EKG 03/13/2019 sinus brady at 59 bpm  Assessment and Plan:  1. Paroxysmal Afib/flutter Pt  underwent ablation of AF/AFL 12/2018. Had ERAF requiring amio and DCCV.  He does not feel like he has had any significant amount of Afib Continue diltiazem 240 mg TID Continue amiodarone 200 mg daily Continue atenolol 50 mg BID Continue eliquis 5 mg BID Labwork from 07/2019 reviewed and to be scanned in. He states his PCP recently checked his thyroid level, so will request those recent labs as well.   2. Obesity He is working on weight loss. Encouraged cardio exercise.   3. Snoring Possible sleep apnea. Will continue weight loss attempts for the time being.   Addendum: Pt thought PCP had check TSH, but had check PSA.  Consider at next visit.   Shirley Friar, PA-C  10/09/19 11:14 AM

## 2019-10-09 ENCOUNTER — Other Ambulatory Visit: Payer: Self-pay

## 2019-10-09 ENCOUNTER — Ambulatory Visit (INDEPENDENT_AMBULATORY_CARE_PROVIDER_SITE_OTHER): Payer: 59 | Admitting: Student

## 2019-10-09 VITALS — BP 118/74 | HR 62 | Ht 69.0 in | Wt 253.0 lb

## 2019-10-09 DIAGNOSIS — I480 Paroxysmal atrial fibrillation: Secondary | ICD-10-CM | POA: Diagnosis not present

## 2019-10-09 DIAGNOSIS — E663 Overweight: Secondary | ICD-10-CM

## 2019-10-09 NOTE — Patient Instructions (Signed)
Medication Instructions:  Your physician recommends that you continue on your current medications as directed. Please refer to the Current Medication list given to you today.  If you need a refill on your cardiac medications before your next appointment, please call your pharmacy.   Lab work: NONE ORDERED  TODAY   If you have labs (blood work) drawn today and your tests are completely normal, you will receive your results only by: Marland Kitchen MyChart Message (if you have MyChart) OR . A paper copy in the mail If you have any lab test that is abnormal or we need to change your treatment, we will call you to review the results.  Testing/Procedures: NONE ORDERED  TODAY    Follow-Up: At [facility], you and your health needs are our priority.  As part of our continuing mission to provide you with exceptional heart care, we have created designated Provider Care Teams.  These Care Teams include your primary Cardiologist (physician) and Advanced Practice Providers (APPs -  Physician Assistants and Nurse Practitioners) who all work together to provide you with the care you need, when you need it. You will need a follow up appointment in 6 months.  Please call our office 2 months in advance to schedule this appointment.  You may see Dr. Rayann Heman  or one of the following Advanced Practice Providers on your designated Care Team:   Chanetta Marshall, NP . Tommye Standard, PA-C . Donavan Burnet PA-C   Any Other Special Instructions Will Be Listed Below (If Applicable).

## 2019-10-15 ENCOUNTER — Other Ambulatory Visit: Payer: Self-pay | Admitting: Cardiology

## 2019-10-16 ENCOUNTER — Telehealth: Payer: Self-pay | Admitting: Student

## 2019-10-16 MED ORDER — DILTIAZEM HCL 120 MG PO TABS: 240.0000 mg | ORAL_TABLET | Freq: Three times a day (TID) | ORAL | 3 refills | Status: DC

## 2019-10-16 NOTE — Telephone Encounter (Signed)
New message:    Patient calling stating that some one called him and so not know who that was. It is concerning medications. Please call patient back.

## 2019-10-16 NOTE — Telephone Encounter (Signed)
Called pt to inform him that his medication was sent to his pharmacy as requested and if he has any other problems, questions or concerns, to give our office a call back. Pt verbalized understanding.

## 2019-11-07 ENCOUNTER — Other Ambulatory Visit: Payer: Self-pay | Admitting: Internal Medicine

## 2019-11-07 NOTE — Telephone Encounter (Signed)
Prescription refill request for Eliquis received.  Last office visit: 10-09-2019, [place] Scr: 1.1, 08-26-2019, via [facility] Age: 58 y.o. Weight:114.8 kg  Prescription refill sent.

## 2019-11-11 ENCOUNTER — Other Ambulatory Visit: Payer: Self-pay | Admitting: Internal Medicine

## 2020-02-27 ENCOUNTER — Other Ambulatory Visit: Payer: Self-pay

## 2020-02-27 ENCOUNTER — Ambulatory Visit (HOSPITAL_COMMUNITY)
Admission: RE | Admit: 2020-02-27 | Discharge: 2020-02-27 | Disposition: A | Discharge status: Home / Self Care | Payer: 59 | Source: Ambulatory Visit | Attending: Nurse Practitioner | Admitting: Nurse Practitioner

## 2020-02-27 VITALS — BP 128/78 | HR 63 | Ht 69.0 in | Wt 259.6 lb

## 2020-02-27 DIAGNOSIS — Z7901 Long term (current) use of anticoagulants: Secondary | ICD-10-CM | POA: Diagnosis not present

## 2020-02-27 DIAGNOSIS — Z885 Allergy status to narcotic agent status: Secondary | ICD-10-CM | POA: Insufficient documentation

## 2020-02-27 DIAGNOSIS — Z8249 Family history of ischemic heart disease and other diseases of the circulatory system: Secondary | ICD-10-CM | POA: Insufficient documentation

## 2020-02-27 DIAGNOSIS — D6869 Other thrombophilia: Secondary | ICD-10-CM

## 2020-02-27 DIAGNOSIS — Z888 Allergy status to other drugs, medicaments and biological substances status: Secondary | ICD-10-CM | POA: Insufficient documentation

## 2020-02-27 DIAGNOSIS — Z79899 Other long term (current) drug therapy: Secondary | ICD-10-CM | POA: Insufficient documentation

## 2020-02-27 DIAGNOSIS — I484 Atypical atrial flutter: Secondary | ICD-10-CM

## 2020-02-27 DIAGNOSIS — Z880 Allergy status to penicillin: Secondary | ICD-10-CM | POA: Insufficient documentation

## 2020-02-27 DIAGNOSIS — Z87891 Personal history of nicotine dependence: Secondary | ICD-10-CM | POA: Diagnosis not present

## 2020-02-27 DIAGNOSIS — I4892 Unspecified atrial flutter: Secondary | ICD-10-CM | POA: Diagnosis not present

## 2020-02-27 DIAGNOSIS — Z881 Allergy status to other antibiotic agents status: Secondary | ICD-10-CM | POA: Diagnosis not present

## 2020-02-27 DIAGNOSIS — K219 Gastro-esophageal reflux disease without esophagitis: Secondary | ICD-10-CM | POA: Insufficient documentation

## 2020-02-27 DIAGNOSIS — I480 Paroxysmal atrial fibrillation: Secondary | ICD-10-CM | POA: Insufficient documentation

## 2020-02-27 LAB — COMPREHENSIVE METABOLIC PANEL
ALT: 39 U/L (ref 0–44)
AST: 40 U/L (ref 15–41)
Albumin: 3.8 g/dL (ref 3.5–5.0)
Alkaline Phosphatase: 52 U/L (ref 38–126)
Anion gap: 11 (ref 5–15)
BUN: 28 mg/dL — ABNORMAL HIGH (ref 6–20)
CO2: 22 mmol/L (ref 22–32)
Calcium: 9.1 mg/dL (ref 8.9–10.3)
Chloride: 106 mmol/L (ref 98–111)
Creatinine, Ser: 1.19 mg/dL (ref 0.61–1.24)
GFR calc Af Amer: >60 mL/min (ref >60)
GFR calc non Af Amer: >60 mL/min (ref >60)
Glucose, Bld: 90 mg/dL (ref 70–99)
Potassium: 4.8 mmol/L (ref 3.5–5.1)
Sodium: 139 mmol/L (ref 135–145)
Total Bilirubin: 0.8 mg/dL (ref 0.3–1.2)
Total Protein: 6.7 g/dL (ref 6.5–8.1)

## 2020-02-27 LAB — CBC
HCT: 50.2 % (ref 39.0–52.0)
Hemoglobin: 16.3 g/dL (ref 13.0–17.0)
MCH: 31.3 pg (ref 26.0–34.0)
MCHC: 32.5 g/dL (ref 30.0–36.0)
MCV: 96.5 fL (ref 80.0–100.0)
Platelets: 163 10*3/uL (ref 150–400)
RBC: 5.2 MIL/uL (ref 4.22–5.81)
RDW: 13.9 % (ref 11.5–15.5)
WBC: 6.8 10*3/uL (ref 4.0–10.5)
nRBC: 0 % (ref 0.0–0.2)

## 2020-02-27 LAB — TSH: TSH: 1.869 u[IU]/mL (ref 0.350–4.500)

## 2020-02-27 NOTE — Patient Instructions (Signed)
Cardioversion scheduled for Thursday, March 11th  - Arrive at the [facility] and go to admitting at 9:30AM  -Do not eat or drink anything after midnight the night prior to your procedure.  - Take all your morning medication with a sip of water prior to arrival.  - You will not be able to drive home after your procedure.

## 2020-02-28 ENCOUNTER — Encounter (HOSPITAL_COMMUNITY): Payer: Self-pay | Admitting: Nurse Practitioner

## 2020-02-28 NOTE — H&P (View-Only) (Signed)
Primary Care Physician: [name], MD Referring Physician: Dr. Vanessa Barbara is a 59 y.o. male with a h/o paroxysmal afib , s/p ablation 12/2018, that is in the afib clinic, 02/27/20 for Kardia strips showing pt to be out of rhythm for about a week. His EKG shows aflutter with 4:1 conduction. He feels no different out of rhythm. He got Lourena Simmonds for this purpose and actually has been featured on a TV commercial recently speaking for them. He continues on eliquis for a CHA2DS2VASc score of 1.  Today, he denies symptoms of palpitations, chest pain, shortness of breath, orthopnea, PND, lower extremity edema, dizziness, presyncope, syncope, or neurologic sequela. The patient is tolerating medications without difficulties and is otherwise without complaint today.   Past Medical History:  Diagnosis Date  . Arthritis   . Atrial flutter (HCC)    diagnosed 2005  . Depression   . Diverticular disease   . GERD (gastroesophageal reflux disease)   . Obesity   . Paroxysmal atrial fibrillation (HCC)   . Primary localized osteoarthritis of left knee    Past Surgical History:  Procedure Laterality Date  . ATRIAL FIBRILLATION ABLATION N/A 01/24/2019   Procedure: ATRIAL FIBRILLATION ABLATION;  Surgeon: Hillis Range, MD;  Location: [facility];  Service: Cardiovascular;  Laterality: N/A;  . CARDIOVERSION N/A 12/06/2018   Procedure: CARDIOVERSION;  Surgeon: Parke Poisson, MD;  Location: [facility] ENDOSCOPY;  Service: Cardiovascular;  Laterality: N/A;  . CARDIOVERSION N/A 03/13/2019   Procedure: CARDIOVERSION;  Surgeon: Wendall Stade, MD;  Location: [facility] ENDOSCOPY;  Service: Cardiovascular;  Laterality: N/A;  . COLON SURGERY  12/07, 5/08, 10/08  . COLONOSCOPY    . COLOSTOMY  2007  . COLOSTOMY REVERSAL    . EYE SURGERY Bilateral    cataracts  . INCISIONAL HERNIA REPAIR  2008   with mesh  . KNEE ARTHROSCOPY Left 5/10   x2  . MULTIPLE TOOTH EXTRACTIONS    . neck fusion  5/11  .  REPLANTATION THUMB Right   . TONSILLECTOMY    . TOTAL KNEE ARTHROPLASTY Left 08/23/2017  . TOTAL KNEE ARTHROPLASTY Left 08/23/2017   Procedure: LEFT TOTAL KNEE ARTHROPLASTY;  Surgeon: Salvatore Marvel, MD;  Location: [facility] OR;  Service: Orthopedics;  Laterality: Left;  . WISDOM TOOTH EXTRACTION      Current Outpatient Medications  Medication Sig Dispense Refill  . acetaminophen (TYLENOL) 650 MG CR tablet Take 650-1,300 mg by mouth every 8 (eight) hours as needed (pain.).     Marland Kitchen amiodarone (PACERONE) 200 MG tablet Take 1 tablet by mouth once daily (Patient taking differently: Take 200 mg by mouth every evening. ) 90 tablet 2  . atenolol (TENORMIN) 50 MG tablet TAKE 1 TABLET BY MOUTH TWICE DAILY. (BETA BLOCKER) (Patient taking differently: Take 50 mg by mouth in the morning and at bedtime. ) 60 tablet 10  . diltiazem (CARDIZEM) 120 MG tablet Take 2 tablets (240 mg total) by mouth 3 (three) times daily. (Patient taking differently: Take 120 mg by mouth 3 (three) times daily. ) 540 tablet 3  . ELIQUIS 5 MG TABS tablet Take 1 tablet by mouth twice daily (Patient taking differently: Take 5 mg by mouth in the morning and at bedtime. ) 60 tablet 5  . gabapentin (NEURONTIN) 100 MG capsule Take 100 mg by mouth daily as needed (nerves).     . methocarbamol (ROBAXIN) 500 MG tablet Take 500 mg by mouth every 8 (eight) hours as needed for muscle  spasms.     Marland Kitchen omega-3 acid ethyl esters (LOVAZA) 1 g capsule Take 1 g by mouth 2 (two) times daily.     Marland Kitchen oxycodone-acetaminophen (PERCOCET) 2.5-325 MG tablet Take 1 tablet by mouth in the morning and at bedtime.     . Turmeric (QC TUMERIC COMPLEX) 500 MG CAPS Take 500 mg by mouth in the morning and at bedtime.    . diclofenac (VOLTAREN) 50 MG EC tablet Take 50 mg by mouth daily.      No current facility-administered medications for this encounter.    Allergies  Allergen Reactions  . Erythromycin Other (See Comments)    Abdominal cramps   . Hydrocodone-Acetaminophen  Other (See Comments)    Makes him feel "high"  . Mobic [Meloxicam] Other (See Comments)    Lower back and kidney pain   . Morphine And Related Other (See Comments)    Abdominal cramping  Migraine headaches  . Prednisone Hives, Anxiety and Other (See Comments)    Makes him anxious  . Pregabalin Other (See Comments)    Dry mouth and out-of-body feeling  . Z-Pak [Azithromycin] Nausea Only and Other (See Comments)    Severe stomach cramps   . Lopressor [Metoprolol Tartrate] Swelling  . Penicillins Rash and Other (See Comments)    Has patient had a PCN reaction causing immediate rash, facial/tongue/throat swelling, SOB or lightheadedness with hypotension: No Has patient had a PCN reaction causing severe rash involving mucus membranes or skin necrosis: No Has patient had a PCN reaction that required hospitalization: No Has patient had a PCN reaction occurring within the last 10 years: No If all of the above answers are "NO", then may proceed with Cephalosporin use.      Social History   Socioeconomic History  . Marital status: Married    Spouse name: Not on file  . Number of children: Not on file  . Years of education: Not on file  . Highest education level: Not on file  Occupational History  . Not on file  Tobacco Use  . Smoking status: Former Smoker    Years: 2.00    Types: Cigarettes    Quit date: 08/11/2012    Years since quitting: 7.5  . Smokeless tobacco: Never Used  Substance and Sexual Activity  . Alcohol use: Yes    Alcohol/week: 14.0 standard drinks    Types: 14 Cans of beer per week    Comment: 2 beers/ day  . Drug use: No  . Sexual activity: Not on file    Comment: married ([name])  Other Topics Concern  . Not on file  Social History Narrative  . Not on file   Social Determinants of Health   Financial Resource Strain:   . Difficulty of Paying Living Expenses: Not on file  Food Insecurity:   . Worried About Charity fundraiser in the Last Year: Not on  file  . Ran Out of Food in the Last Year: Not on file  Transportation Needs:   . Lack of Transportation (Medical): Not on file  . Lack of Transportation (Non-Medical): Not on file  Physical Activity:   . Days of Exercise per Week: Not on file  . Minutes of Exercise per Session: Not on file  Stress:   . Feeling of Stress : Not on file  Social Connections:   . Frequency of Communication with Friends and Family: Not on file  . Frequency of Social Gatherings with Friends and Family: Not on file  .  Attends Religious Services: Not on file  . Active Member of Clubs or Organizations: Not on file  . Attends Banker Meetings: Not on file  . Marital Status: Not on file  Intimate Partner Violence:   . Fear of Current or Ex-Partner: Not on file  . Emotionally Abused: Not on file  . Physically Abused: Not on file  . Sexually Abused: Not on file    Family History  Problem Relation Age of Onset  . Hypertension Mother   . CAD Father   . Hypertension Father   . Diabetes Father   . Colon polyps Father   . CAD Sister   . Cancer Paternal Grandfather        brain and lung    ROS- All systems are reviewed and negative except as per the HPI above  Physical Exam: Vitals:   02/27/20 1357  BP: 128/78  Pulse: 63  Weight: 117.8 kg  Height: 5\' 9"  (1.753 m)   Wt Readings from Last 3 Encounters:  02/27/20 117.8 kg  10/09/19 114.8 kg  03/13/19 110.7 kg    Labs: Lab Results  Component Value Date   NA 139 02/27/2020   K 4.8 02/27/2020   CL 106 02/27/2020   CO2 22 02/27/2020   GLUCOSE 90 02/27/2020   BUN 28 (H) 02/27/2020   CREATININE 1.19 02/27/2020   CALCIUM 9.1 02/27/2020   PHOS 3.5 11/01/2007   MG 2.0 12/07/2018   Lab Results  Component Value Date   INR 0.95 11/18/2014   Lab Results  Component Value Date   CHOL  10/29/2007    100        ATP III CLASSIFICATION:  <200     mg/dL   Desirable  13/02/2007  mg/dL   Borderline High  502-774    mg/dL   High   TRIG >=128  786     GEN- The patient is well appearing, alert and oriented x 3 today.   Head- normocephalic, atraumatic Eyes-  Sclera clear, conjunctiva pink Ears- hearing intact Oropharynx- clear Neck- supple, no JVP Lymph- no cervical lymphadenopathy Lungs- Clear to ausculation bilaterally, normal work of breathing Heart- Regular rate and rhythm, no murmurs, rubs or gallops, PMI not laterally displaced GI- soft, NT, ND, + BS Extremities- no clubbing, cyanosis, or edema MS- no significant deformity or atrophy Skin- no rash or lesion Psych- euthymic mood, full affect Neuro- strength and sensation are intact  EKG-atrial  flutter at 4:1 conduction,  63 bpm, qrs int 102 ms, qtc 440 ms    Assessment and Plan: 1. Atrial fib S/p ablation 12/2018 Now with atrial flutter persistently for about one week  Will set up for cardioversion Continue amiodarone  200 mg daily , diltiazem 120 mg daily, atenolol 50 mg bid  Cbc/tsh/cmet/covid test scheduled today   2. CHA2DS2VASc score of 1 (LV dysfunction) States no missed doses of eliquis 5 mg bid for at least 3 weeks   Will obtain echo on return to afib clinic one week after cardioversion and then set up to see Dr. 01/2019 in around 6 weeks   [name] C. Lupita Leash [facility] [address] [place], [place] [phone]

## 2020-02-28 NOTE — Progress Notes (Signed)
Primary Care Physician: [name], MD Referring Physician: Dr. Vanessa Barbara is a 59 y.o. male with a h/o paroxysmal afib , s/p ablation 12/2018, that is in the afib clinic, 02/27/20 for Kardia strips showing pt to be out of rhythm for about a week. His EKG shows aflutter with 4:1 conduction. He feels no different out of rhythm. He got Lourena Simmonds for this purpose and actually has been featured on a TV commercial recently speaking for them. He continues on eliquis for a CHA2DS2VASc score of 1.  Today, he denies symptoms of palpitations, chest pain, shortness of breath, orthopnea, PND, lower extremity edema, dizziness, presyncope, syncope, or neurologic sequela. The patient is tolerating medications without difficulties and is otherwise without complaint today.   Past Medical History:  Diagnosis Date  . Arthritis   . Atrial flutter (HCC)    diagnosed 2005  . Depression   . Diverticular disease   . GERD (gastroesophageal reflux disease)   . Obesity   . Paroxysmal atrial fibrillation (HCC)   . Primary localized osteoarthritis of left knee    Past Surgical History:  Procedure Laterality Date  . ATRIAL FIBRILLATION ABLATION N/A 01/24/2019   Procedure: ATRIAL FIBRILLATION ABLATION;  Surgeon: Hillis Range, MD;  Location: [facility]VAS[facility] CV LAB;  Service: Cardiovascular;  Laterality: N/A;  . CARDIOVERSION N/A 12/06/2018   Procedure: CARDIOVERSION;  Surgeon: Parke Poisson, MD;  Location: [facility] ENDOSCOPY;  Service: Cardiovascular;  Laterality: N/A;  . CARDIOVERSION N/A 03/13/2019   Procedure: CARDIOVERSION;  Surgeon: Wendall Stade, MD;  Location: [facility] ENDOSCOPY;  Service: Cardiovascular;  Laterality: N/A;  . COLON SURGERY  12/07, 5/08, 10/08  . COLONOSCOPY    . COLOSTOMY  2007  . COLOSTOMY REVERSAL    . EYE SURGERY Bilateral    cataracts  . INCISIONAL HERNIA REPAIR  2008   with mesh  . KNEE ARTHROSCOPY Left 5/10   x2  . MULTIPLE TOOTH EXTRACTIONS    . neck fusion  5/11  .  REPLANTATION THUMB Right   . TONSILLECTOMY    . TOTAL KNEE ARTHROPLASTY Left 08/23/2017  . TOTAL KNEE ARTHROPLASTY Left 08/23/2017   Procedure: LEFT TOTAL KNEE ARTHROPLASTY;  Surgeon: Salvatore Marvel, MD;  Location: [facility];  Service: Orthopedics;  Laterality: Left;  . WISDOM TOOTH EXTRACTION      Current Outpatient Medications  Medication Sig Dispense Refill  . acetaminophen (TYLENOL) 650 MG CR tablet Take 650-1,300 mg by mouth every 8 (eight) hours as needed (pain.).     Marland Kitchen amiodarone (PACERONE) 200 MG tablet Take 1 tablet by mouth once daily (Patient taking differently: Take 200 mg by mouth every evening. ) 90 tablet 2  . atenolol (TENORMIN) 50 MG tablet TAKE 1 TABLET BY MOUTH TWICE DAILY. (BETA BLOCKER) (Patient taking differently: Take 50 mg by mouth in the morning and at bedtime. ) 60 tablet 10  . diltiazem (CARDIZEM) 120 MG tablet Take 2 tablets (240 mg total) by mouth 3 (three) times daily. (Patient taking differently: Take 120 mg by mouth 3 (three) times daily. ) 540 tablet 3  . ELIQUIS 5 MG TABS tablet Take 1 tablet by mouth twice daily (Patient taking differently: Take 5 mg by mouth in the morning and at bedtime. ) 60 tablet 5  . gabapentin (NEURONTIN) 100 MG capsule Take 100 mg by mouth daily as needed (nerves).     . methocarbamol (ROBAXIN) 500 MG tablet Take 500 mg by mouth every 8 (eight) hours as needed for muscle  spasms.     Marland Kitchen omega-3 acid ethyl esters (LOVAZA) 1 g capsule Take 1 g by mouth 2 (two) times daily.     Marland Kitchen oxycodone-acetaminophen (PERCOCET) 2.5-325 MG tablet Take 1 tablet by mouth in the morning and at bedtime.     . Turmeric (QC TUMERIC COMPLEX) 500 MG CAPS Take 500 mg by mouth in the morning and at bedtime.    . diclofenac (VOLTAREN) 50 MG EC tablet Take 50 mg by mouth daily.      No current facility-administered medications for this encounter.    Allergies  Allergen Reactions  . Erythromycin Other (See Comments)    Abdominal cramps   . Hydrocodone-Acetaminophen  Other (See Comments)    Makes him feel "high"  . Mobic [Meloxicam] Other (See Comments)    Lower back and kidney pain   . Morphine And Related Other (See Comments)    Abdominal cramping  Migraine headaches  . Prednisone Hives, Anxiety and Other (See Comments)    Makes him anxious  . Pregabalin Other (See Comments)    Dry mouth and out-of-body feeling  . Z-Pak [Azithromycin] Nausea Only and Other (See Comments)    Severe stomach cramps   . Lopressor [Metoprolol Tartrate] Swelling  . Penicillins Rash and Other (See Comments)    Has patient had a PCN reaction causing immediate rash, facial/tongue/throat swelling, SOB or lightheadedness with hypotension: No Has patient had a PCN reaction causing severe rash involving mucus membranes or skin necrosis: No Has patient had a PCN reaction that required hospitalization: No Has patient had a PCN reaction occurring within the last 10 years: No If all of the above answers are "NO", then may proceed with Cephalosporin use.      Social History   Socioeconomic History  . Marital status: Married    Spouse name: Not on file  . Number of children: Not on file  . Years of education: Not on file  . Highest education level: Not on file  Occupational History  . Not on file  Tobacco Use  . Smoking status: Former Smoker    Years: 2.00    Types: Cigarettes    Quit date: 08/11/2012    Years since quitting: 7.5  . Smokeless tobacco: Never Used  Substance and Sexual Activity  . Alcohol use: Yes    Alcohol/week: 14.0 standard drinks    Types: 14 Cans of beer per week    Comment: 2 beers/ day  . Drug use: No  . Sexual activity: Not on file    Comment: married ([name])  Other Topics Concern  . Not on file  Social History Narrative  . Not on file   Social Determinants of Health   Financial Resource Strain:   . Difficulty of Paying Living Expenses: Not on file  Food Insecurity:   . Worried About Charity fundraiser in the Last Year: Not on  file  . Ran Out of Food in the Last Year: Not on file  Transportation Needs:   . Lack of Transportation (Medical): Not on file  . Lack of Transportation (Non-Medical): Not on file  Physical Activity:   . Days of Exercise per Week: Not on file  . Minutes of Exercise per Session: Not on file  Stress:   . Feeling of Stress : Not on file  Social Connections:   . Frequency of Communication with Friends and Family: Not on file  . Frequency of Social Gatherings with Friends and Family: Not on file  .  Attends Religious Services: Not on file  . Active Member of Clubs or Organizations: Not on file  . Attends Club or Organization Meetings: Not on file  . Marital Status: Not on file  Intimate Partner Violence:   . Fear of Current or Ex-Partner: Not on file  . Emotionally Abused: Not on file  . Physically Abused: Not on file  . Sexually Abused: Not on file    Family History  Problem Relation Age of Onset  . Hypertension Mother   . CAD Father   . Hypertension Father   . Diabetes Father   . Colon polyps Father   . CAD Sister   . Cancer Paternal Grandfather        brain and lung    ROS- All systems are reviewed and negative except as per the HPI above  Physical Exam: Vitals:   02/27/20 1357  BP: 128/78  Pulse: 63  Weight: 117.8 kg  Height: 5' 9" (1.753 m)   Wt Readings from Last 3 Encounters:  02/27/20 117.8 kg  10/09/19 114.8 kg  03/13/19 110.7 kg    Labs: Lab Results  Component Value Date   NA 139 02/27/2020   K 4.8 02/27/2020   CL 106 02/27/2020   CO2 22 02/27/2020   GLUCOSE 90 02/27/2020   BUN 28 (H) 02/27/2020   CREATININE 1.19 02/27/2020   CALCIUM 9.1 02/27/2020   PHOS 3.5 11/01/2007   MG 2.0 12/07/2018   Lab Results  Component Value Date   INR 0.95 11/18/2014   Lab Results  Component Value Date   CHOL  10/29/2007    100        ATP III CLASSIFICATION:  <200     mg/dL   Desirable  200-239  mg/dL   Borderline High  >=240    mg/dL   High   TRIG 103  11/01/2007     GEN- The patient is well appearing, alert and oriented x 3 today.   Head- normocephalic, atraumatic Eyes-  Sclera clear, conjunctiva pink Ears- hearing intact Oropharynx- clear Neck- supple, no JVP Lymph- no cervical lymphadenopathy Lungs- Clear to ausculation bilaterally, normal work of breathing Heart- Regular rate and rhythm, no murmurs, rubs or gallops, PMI not laterally displaced GI- soft, NT, ND, + BS Extremities- no clubbing, cyanosis, or edema MS- no significant deformity or atrophy Skin- no rash or lesion Psych- euthymic mood, full affect Neuro- strength and sensation are intact  EKG-atrial  flutter at 4:1 conduction,  63 bpm, qrs int 102 ms, qtc 440 ms    Assessment and Plan: 1. Atrial fib S/p ablation 12/2018 Now with atrial flutter persistently for about one week  Will set up for cardioversion Continue amiodarone  200 mg daily , diltiazem 120 mg daily, atenolol 50 mg bid  Cbc/tsh/cmet/covid test scheduled today   2. CHA2DS2VASc score of 1 (LV dysfunction) States no missed doses of eliquis 5 mg bid for at least 3 weeks   Will obtain echo on return to afib clinic one week after cardioversion and then set up to see Dr. Allred in around 6 weeks   Jolicia Delira C. Tuleen Mandelbaum, ANP-C Afib Clinic [facility] [address] [place], [place] [address] [phone]  

## 2020-03-02 ENCOUNTER — Other Ambulatory Visit (HOSPITAL_COMMUNITY)
Admission: RE | Admit: 2020-03-02 | Discharge: 2020-03-02 | Disposition: A | Discharge status: Home / Self Care | Payer: 59 | Source: Ambulatory Visit | Attending: Cardiovascular Disease | Admitting: Cardiovascular Disease

## 2020-03-02 DIAGNOSIS — Z20822 Contact with and (suspected) exposure to covid-19: Secondary | ICD-10-CM | POA: Insufficient documentation

## 2020-03-02 DIAGNOSIS — Z01812 Encounter for preprocedural laboratory examination: Secondary | ICD-10-CM | POA: Insufficient documentation

## 2020-03-02 LAB — SARS CORONAVIRUS 2 (TAT 6-24 HRS): SARS Coronavirus 2: NEGATIVE

## 2020-03-04 ENCOUNTER — Other Ambulatory Visit (HOSPITAL_COMMUNITY): Payer: Self-pay | Admitting: *Deleted

## 2020-03-04 DIAGNOSIS — I4819 Other persistent atrial fibrillation: Secondary | ICD-10-CM

## 2020-03-05 ENCOUNTER — Ambulatory Visit (HOSPITAL_COMMUNITY): Payer: 59 | Admitting: Anesthesiology

## 2020-03-05 ENCOUNTER — Ambulatory Visit (HOSPITAL_COMMUNITY)
Admission: RE | Admit: 2020-03-05 | Discharge: 2020-03-05 | Disposition: A | Discharge status: Home / Self Care | Payer: 59 | Attending: Cardiovascular Disease | Admitting: Cardiovascular Disease

## 2020-03-05 ENCOUNTER — Encounter (HOSPITAL_COMMUNITY)
Admission: RE | Disposition: A | Discharge status: Home / Self Care | Payer: Self-pay | Source: Home / Self Care | Attending: Cardiovascular Disease

## 2020-03-05 ENCOUNTER — Encounter (HOSPITAL_COMMUNITY): Payer: Self-pay | Admitting: Cardiovascular Disease

## 2020-03-05 ENCOUNTER — Other Ambulatory Visit: Payer: Self-pay

## 2020-03-05 DIAGNOSIS — I4892 Unspecified atrial flutter: Secondary | ICD-10-CM | POA: Insufficient documentation

## 2020-03-05 DIAGNOSIS — I480 Paroxysmal atrial fibrillation: Secondary | ICD-10-CM | POA: Insufficient documentation

## 2020-03-05 DIAGNOSIS — Z87891 Personal history of nicotine dependence: Secondary | ICD-10-CM | POA: Diagnosis not present

## 2020-03-05 DIAGNOSIS — K219 Gastro-esophageal reflux disease without esophagitis: Secondary | ICD-10-CM | POA: Diagnosis not present

## 2020-03-05 DIAGNOSIS — F329 Major depressive disorder, single episode, unspecified: Secondary | ICD-10-CM | POA: Diagnosis not present

## 2020-03-05 DIAGNOSIS — Z79899 Other long term (current) drug therapy: Secondary | ICD-10-CM | POA: Insufficient documentation

## 2020-03-05 DIAGNOSIS — M1712 Unilateral primary osteoarthritis, left knee: Secondary | ICD-10-CM | POA: Insufficient documentation

## 2020-03-05 DIAGNOSIS — Z7901 Long term (current) use of anticoagulants: Secondary | ICD-10-CM | POA: Diagnosis not present

## 2020-03-05 DIAGNOSIS — E669 Obesity, unspecified: Secondary | ICD-10-CM | POA: Diagnosis not present

## 2020-03-05 HISTORY — PX: CARDIOVERSION: SHX1299

## 2020-03-05 SURGERY — CARDIOVERSION
Anesthesia: General

## 2020-03-05 MED ORDER — PROPOFOL 10 MG/ML IV BOLUS
INTRAVENOUS | Status: DC | PRN
  Administered 2020-03-05: 80 mg via INTRAVENOUS

## 2020-03-05 MED ORDER — LIDOCAINE 2% (20 MG/ML) 5 ML SYRINGE
INTRAMUSCULAR | Status: DC | PRN
  Administered 2020-03-05: 100 mg via INTRAVENOUS

## 2020-03-05 MED ORDER — SODIUM CHLORIDE 0.9 % IV SOLN: INTRAVENOUS | Status: DC | PRN

## 2020-03-05 NOTE — Interval H&P Note (Signed)
History and Physical Interval Note:  03/05/2020 9:46 AM  [name]  has presented today for surgery, with the diagnosis of A-FIB.  The various methods of treatment have been discussed with the patient and family. After consideration of risks, benefits and other options for treatment, the patient has consented to  Procedure(s): CARDIOVERSION (N/A) as a surgical intervention.  The patient's history has been reviewed, patient examined, no change in status, stable for surgery.  I have reviewed the patient's chart and labs.  Questions were answered to the patient's satisfaction.     EKG with atypical flutter, likely left side given prior ablation. On eliquis and no missed doses > 3 months.   Gerri Spore T. Flora Lipps, MD [facility]  [address] [place], [place] [address] [phone]  9:46 AM

## 2020-03-05 NOTE — CV Procedure (Signed)
   DIRECT CURRENT CARDIOVERSION  NAME:  [name]    MRN: [id number] DOB:  [birth year]    ADMIT DATE: 03/05/2020  Indication:  Symptomatic atrial flutter  Procedure Note:  The patient signed informed consent.  They have had had therapeutic anticoagulation with eliquis greater than 3 weeks.  Anesthesia was administered by Dr. Krista Blue.  Adequate airway was maintained throughout and vital followed per protocol.  They were cardioverted x 1 with 200J of biphasic synchronized energy.  They converted to NSR.  There were no apparent complications.  The patient had normal neuro status and respiratory status post procedure with vitals stable as recorded elsewhere.    Follow up: They will continue on current medical therapy and follow up with cardiology as scheduled.  David Spore T. Flora Lipps, MD [facility]  [address] [place], [place] [address] [phone]  10:24 AM

## 2020-03-05 NOTE — Anesthesia Postprocedure Evaluation (Addendum)
Anesthesia Post Note  Patient: [name]  Procedure(s) Performed: CARDIOVERSION (N/A )     Patient location during evaluation: Endoscopy Anesthesia Type: General Level of consciousness: sedated Pain management: pain level controlled Vital Signs Assessment: post-procedure vital signs reviewed and stable Respiratory status: spontaneous breathing and respiratory function stable Cardiovascular status: stable Postop Assessment: no apparent nausea or vomiting Anesthetic complications: no    Last Vitals:  Vitals:   03/05/20 1040 03/05/20 1048  BP: 112/67 113/77  Pulse: (!) 57 (!) 57  Resp: 16 13  Temp:    SpO2: 96% 96%    Last Pain:  Vitals:   03/05/20 1047  TempSrc:   PainSc: 0-No pain                 Renly Guedes DANIEL

## 2020-03-05 NOTE — Discharge Instructions (Signed)
Electrical Cardioversion Electrical cardioversion is the delivery of a jolt of electricity to restore a normal rhythm to the heart. A rhythm that is too fast or is not regular keeps the heart from pumping well. In this procedure, sticky patches or metal paddles are placed on the chest to deliver electricity to the heart from a device. This procedure may be done in an emergency if:  There is low or no blood pressure as a result of the heart rhythm.  Normal rhythm must be restored as fast as possible to protect the brain and heart from further damage.  It may save a life. This may also be a scheduled procedure for irregular or fast heart rhythms that are not immediately life-threatening. Tell a health care provider about:  Any allergies you have.  All medicines you are taking, including vitamins, herbs, eye drops, creams, and over-the-counter medicines.  Any problems you or family members have had with anesthetic medicines.  Any blood disorders you have.  Any surgeries you have had.  Any medical conditions you have.  Whether you are pregnant or may be pregnant. What are the risks? Generally, this is a safe procedure. However, problems may occur, including:  Allergic reactions to medicines.  A blood clot that breaks free and travels to other parts of your body.  The possible return of an abnormal heart rhythm within hours or days after the procedure.  Your heart stopping (cardiac arrest). This is rare. What happens before the procedure? Medicines  Your health care provider may have you start taking: ? Blood-thinning medicines (anticoagulants) so your blood does not clot as easily. ? Medicines to help stabilize your heart rate and rhythm.  Ask your health care provider about: ? Changing or stopping your regular medicines. This is especially important if you are taking diabetes medicines or blood thinners. ? Taking medicines such as aspirin and ibuprofen. These medicines can  thin your blood. Do not take these medicines unless your health care provider tells you to take them. ? Taking over-the-counter medicines, vitamins, herbs, and supplements. General instructions  Follow instructions from your health care provider about eating or drinking restrictions.  Plan to have someone take you home from the hospital or clinic.  If you will be going home right after the procedure, plan to have someone with you for 24 hours.  Ask your health care provider what steps will be taken to help prevent infection. These may include washing your skin with a germ-killing soap. What happens during the procedure?   An IV will be inserted into one of your veins.  Sticky patches (electrodes) or metal paddles may be placed on your chest.  You will be given a medicine to help you relax (sedative).  An electrical shock will be delivered. The procedure may vary among health care providers and hospitals. What can I expect after the procedure?  Your blood pressure, heart rate, breathing rate, and blood oxygen level will be monitored until you leave the hospital or clinic.  Your heart rhythm will be watched to make sure it does not change.  You may have some redness on the skin where the shocks were given. Follow these instructions at home:  Do not drive for 24 hours if you were given a sedative during your procedure.  Take over-the-counter and prescription medicines only as told by your health care provider.  Ask your health care provider how to check your pulse. Check it often.  Rest for 48 hours after the procedure or   as told by your health care provider.  Avoid or limit your caffeine use as told by your health care provider.  Keep all follow-up visits as told by your health care provider. This is important. Contact a health care provider if:  You feel like your heart is beating too quickly or your pulse is not regular.  You have a serious muscle cramp that does not go  away. Get help right away if:  You have discomfort in your chest.  You are dizzy or you feel faint.  You have trouble breathing or you are short of breath.  Your speech is slurred.  You have trouble moving an arm or leg on one side of your body.  Your fingers or toes turn cold or blue. Summary  Electrical cardioversion is the delivery of a jolt of electricity to restore a normal rhythm to the heart.  This procedure may be done right away in an emergency or may be a scheduled procedure if the condition is not an emergency.  Generally, this is a safe procedure.  After the procedure, check your pulse often as told by your health care provider. This information is not intended to replace advice given to you by your health care provider. Make sure you discuss any questions you have with your health care provider. Document Revised: 07/15/2019 Document Reviewed: 07/15/2019 Elsevier Patient Education  2020 [employer]vier Inc[employer]  

## 2020-03-05 NOTE — Addendum Note (Signed)
Addendum  created 03/05/20 1302 by Heather Roberts, MD   Clinical Note Signed

## 2020-03-05 NOTE — Transfer of Care (Signed)
Immediate Anesthesia Transfer of Care Note  Patient: [name]  Procedure(s) Performed: CARDIOVERSION (N/A )  Patient Location: Endoscopy Unit  Anesthesia Type:General  Level of Consciousness: awake, alert  and oriented  Airway & Oxygen Therapy: Patient Spontanous Breathing  Post-op Assessment: Report given to RN and Post -op Vital signs reviewed and stable  Post vital signs: Reviewed and stable  Last Vitals:  Vitals Value Taken Time  BP    Temp    Pulse    Resp    SpO2      Last Pain:  Vitals:   03/05/20 0950  TempSrc: Oral  PainSc: 0-No pain         Complications: No apparent anesthesia complications

## 2020-03-05 NOTE — Anesthesia Procedure Notes (Signed)
Procedure Name: MAC Date/Time: 03/05/2020 10:26 AM Performed by: Imagene Riches, CRNA Pre-anesthesia Checklist: Patient identified, Emergency Drugs available, Suction available and Patient being monitored Patient Re-evaluated:Patient Re-evaluated prior to induction Oxygen Delivery Method: Ambu bag

## 2020-03-05 NOTE — Anesthesia Preprocedure Evaluation (Addendum)
Anesthesia Evaluation  Patient identified by MRN, date of birth, ID band Patient awake    Reviewed: Allergy & Precautions, H&P , NPO status , Patient's Chart, lab work & pertinent test results, reviewed documented beta blocker date and time   History of Anesthesia Complications Negative for: history of anesthetic complications  Airway Mallampati: II  TM Distance: >3 FB Neck ROM: Full    Dental  (+) Teeth Intact, Dental Advisory Given   Pulmonary neg pulmonary ROS, former smoker,    Pulmonary exam normal        Cardiovascular + dysrhythmias Atrial Fibrillation  Rhythm:Irregular Rate:Normal     Neuro/Psych Depression negative neurological ROS  negative psych ROS   GI/Hepatic Neg liver ROS, GERD  ,  Endo/Other  negative endocrine ROS  Renal/GU negative Renal ROS  negative genitourinary   Musculoskeletal  (+) Arthritis , Osteoarthritis,    Abdominal   Peds  Hematology negative hematology ROS (+)   Anesthesia Other Findings   Reproductive/Obstetrics negative OB ROS                           Anesthesia Physical  Anesthesia Plan  ASA: III  Anesthesia Plan: General   Post-op Pain Management:    Induction: Intravenous  PONV Risk Score and Plan: 2 and Treatment may vary due to age or medical condition  Airway Management Planned: Mask  Additional Equipment:   Intra-op Plan:   Post-operative Plan:   Informed Consent: I have reviewed the patients History and Physical, chart, labs and discussed the procedure including the risks, benefits and alternatives for the proposed anesthesia with the patient or authorized representative who has indicated his/her understanding and acceptance.     Dental advisory given  Plan Discussed with: Anesthesiologist and CRNA  Anesthesia Plan Comments:        Anesthesia Quick Evaluation

## 2020-03-06 ENCOUNTER — Encounter: Payer: Self-pay | Admitting: *Deleted

## 2020-03-06 ENCOUNTER — Ambulatory Visit (HOSPITAL_COMMUNITY)
Admission: RE | Admit: 2020-03-06 | Discharge: 2020-03-06 | Disposition: A | Discharge status: Home / Self Care | Payer: 59 | Source: Ambulatory Visit | Attending: Nurse Practitioner | Admitting: Nurse Practitioner

## 2020-03-06 DIAGNOSIS — Z87891 Personal history of nicotine dependence: Secondary | ICD-10-CM | POA: Insufficient documentation

## 2020-03-06 DIAGNOSIS — I081 Rheumatic disorders of both mitral and tricuspid valves: Secondary | ICD-10-CM | POA: Diagnosis present

## 2020-03-06 DIAGNOSIS — I4891 Unspecified atrial fibrillation: Secondary | ICD-10-CM | POA: Insufficient documentation

## 2020-03-06 DIAGNOSIS — I4819 Other persistent atrial fibrillation: Secondary | ICD-10-CM

## 2020-03-06 NOTE — Progress Notes (Signed)
  Echocardiogram 2D Echocardiogram has been performed.  [name] 03/06/2020, 9:41 AM

## 2020-03-11 ENCOUNTER — Encounter (HOSPITAL_COMMUNITY): Payer: Self-pay | Admitting: *Deleted

## 2020-03-12 ENCOUNTER — Encounter (HOSPITAL_COMMUNITY): Payer: Self-pay

## 2020-03-12 ENCOUNTER — Ambulatory Visit (HOSPITAL_COMMUNITY): Payer: 59 | Admitting: Nurse Practitioner

## 2020-03-30 ENCOUNTER — Telehealth: Payer: Self-pay

## 2020-03-30 ENCOUNTER — Other Ambulatory Visit: Payer: Self-pay

## 2020-03-30 ENCOUNTER — Telehealth (INDEPENDENT_AMBULATORY_CARE_PROVIDER_SITE_OTHER): Payer: 59 | Admitting: Internal Medicine

## 2020-03-30 DIAGNOSIS — Z87891 Personal history of nicotine dependence: Secondary | ICD-10-CM | POA: Diagnosis not present

## 2020-03-30 DIAGNOSIS — E669 Obesity, unspecified: Secondary | ICD-10-CM

## 2020-03-30 DIAGNOSIS — I4891 Unspecified atrial fibrillation: Secondary | ICD-10-CM

## 2020-03-30 DIAGNOSIS — I4819 Other persistent atrial fibrillation: Secondary | ICD-10-CM | POA: Diagnosis not present

## 2020-03-30 DIAGNOSIS — I484 Atypical atrial flutter: Secondary | ICD-10-CM

## 2020-03-30 DIAGNOSIS — R000 Tachycardia, unspecified: Secondary | ICD-10-CM | POA: Diagnosis not present

## 2020-03-30 DIAGNOSIS — E663 Overweight: Secondary | ICD-10-CM

## 2020-03-30 DIAGNOSIS — Z7901 Long term (current) use of anticoagulants: Secondary | ICD-10-CM

## 2020-03-30 DIAGNOSIS — D6869 Other thrombophilia: Secondary | ICD-10-CM

## 2020-03-30 NOTE — Progress Notes (Signed)
   Electrophysiology TeleHealth Note   Due to national recommendations of social distancing due to COVID 19, an audio/video telehealth visit is felt to be most appropriate for this patient at this time.  See MyChart message from today for the patient's consent to telehealth for [facility].  Date:  03/30/2020   ID:  [name], DOB 03/06/1961, MRN [id number]  Location: patient's home  Provider location:  [place]  Evaluation Performed: Follow-up visit  PCP:  White, Cynthia, MD   Electrophysiologist:  Dr Zari Cly  Chief Complaint:  palpitations  History of Present Illness:    [name] is a 58 y.o. male who presents via telehealth conferencing today.  Since his recent cardioversion, the patient reports doing reasonably well.  He has developed recurrent persistent afib.  He was cardioverted in March but returned to afib subsequently.  He has been documented to have atypical appearing atrial flutter. Though he is mostly unaware, he has previously had tachycardia mediated CM.  He finds that his heart rates are occasionally elevated. Today, he denies symptoms of palpitations, chest pain, shortness of breath,  lower extremity edema, dizziness, presyncope, or syncope.  The patient is otherwise without complaint today.  The patient denies symptoms of fevers, chills, cough, or new SOB worrisome for COVID 19.  Past Medical History:  Diagnosis Date  . Arthritis   . Atrial flutter (HCC)    diagnosed 2005  . Depression   . Diverticular disease   . GERD (gastroesophageal reflux disease)   . Obesity   . Paroxysmal atrial fibrillation (HCC)   . Primary localized osteoarthritis of left knee     Past Surgical History:  Procedure Laterality Date  . ATRIAL FIBRILLATION ABLATION N/A 01/24/2019   Procedure: ATRIAL FIBRILLATION ABLATION;  Surgeon: Tevion Laforge, MD;  [facility];  Service: Cardiovascular;  Laterality: N/A;  . CARDIOVERSION N/A 12/06/2018   Procedure: CARDIOVERSION;  Surgeon: Acharya, Gayatri A, MD;  Location: [facility] ENDOSCOPY;  Service: Cardiovascular;  Laterality: N/A;  . CARDIOVERSION N/A 03/13/2019   Procedure: CARDIOVERSION;  Surgeon: Nishan, Peter C, MD;  Location: [facility] ENDOS[facility];  Service: Cardiovascular;  Laterality: N/A;  . CARDIOVERSION N/A 03/05/2020   Procedure: CARDIOVERSION;  Surgeon: O'Neal, Omaha Thomas, MD;  Location: [facility] ENDOS[facility];  Service: Cardiovascular;  Laterality: N/A;  . COLON SURGERY  12/07, 5/08, 10/08  . COLONOSCOPY    . COLOSTOMY  2007  . COLOSTOMY REVERSAL    . EYE SURGERY Bilateral    cataracts  . INCISIONAL HERNIA REPAIR  2008   with mesh  . KNEE ARTHROSCOPY Left 5/10   x2  . MULTIPLE TOOTH EXTRACTIONS    . neck fusion  5/11  . REPLANTATION THUMB Right   . TONSILLECTOMY    . TOTAL KNEE ARTHROPLASTY Left 08/23/2017  . TOTAL KNEE ARTHROPLASTY Left 08/23/2017   Procedure: LEFT TOTAL KNEE ARTHROPLASTY;  Surgeon: Wainer, Robert, MD;  Location: [facility] OR;  Service: Orthopedics;  Laterality: Left;  . WISDOM TOOTH EXTRACTION      Current Outpatient Medications  Medication Sig Dispense Refill  . acetaminophen (TYLENOL) 650 MG CR tablet Take 650-1,300 mg by mouth every 8 (eight) hours as needed (pain.).     . amiodarone (PACERONE) 200 MG tablet Take 1 tablet by mouth once daily (Patient taking differently: Take 200 mg by mouth every evening. ) 90 tablet 2  . atenolol (TENORMIN) 50 MG tablet TAKE 1 TABLET BY MOUTH TWICE DAILY. (BETA BLOCKER) (Patient taking differently: Take 50 mg   by mouth in the morning and at bedtime. ) 60 tablet 10  . diclofenac (VOLTAREN) 50 MG EC tablet Take 50 mg by mouth daily.     . diltiazem (CARDIZEM) 120 MG tablet Take 2 tablets (240 mg total) by mouth 3 (three) times daily. (Patient taking differently: Take 120 mg by mouth 3 (three) times daily. ) 540 tablet 3  . ELIQUIS 5 MG TABS tablet Take 1 tablet by mouth twice daily (Patient taking differently: Take 5 mg by mouth in the morning  and at bedtime. ) 60 tablet 5  . gabapentin (NEURONTIN) 100 MG capsule Take 100 mg by mouth daily as needed (nerves).     . methocarbamol (ROBAXIN) 500 MG tablet Take 500 mg by mouth every 8 (eight) hours as needed for muscle spasms.     . omega-3 acid ethyl esters (LOVAZA) 1 g capsule Take 1 g by mouth 2 (two) times daily.     . oxycodone-acetaminophen (PERCOCET) 2.5-325 MG tablet Take 1 tablet by mouth in the morning and at bedtime.     . Turmeric (QC TUMERIC COMPLEX) 500 MG CAPS Take 500 mg by mouth in the morning and at bedtime.     No current facility-administered medications for this visit.    Allergies:   Erythromycin, Hydrocodone-acetaminophen, Mobic [meloxicam], Morphine and related, Prednisone, Pregabalin, Z-pak [azithromycin], Lopressor [metoprolol tartrate], and Penicillins   Social History:  The patient  reports that he quit smoking about 7 years ago. His smoking use included cigarettes. He quit after 2.00 years of use. He has never used smokeless tobacco. He reports current alcohol use of about 14.0 standard drinks of alcohol per week. He reports that he does not use drugs.   ROS:  Please see the history of present illness.   All other systems are personally reviewed and negative.    Exam:    Vital Signs:  There were no vitals taken for this visit.  Well sounding and appearing, alert and conversant, regular work of breathing,  good skin color Eyes- anicteric, neuro- grossly intact, skin- no apparent rash or lesions or cyanosis, mouth- oral mucosa is pink  Labs/Other Tests and Data Reviewed:    Recent Labs: 02/27/2020: ALT 39; BUN 28; Creatinine, Ser 1.19; Hemoglobin 16.3; Platelets 163; Potassium 4.8; Sodium 139; TSH 1.869   Wt Readings from Last 3 Encounters:  02/27/20 259 lb 9.6 oz (117.8 kg)  10/09/19 253 lb (114.8 kg)  03/13/19 244 lb (110.7 kg)     ASSESSMENT & PLAN:    1.  Persistent afib/ atypical atrial flutter The patient has symptomatic, recurrent atrial  fibrillation and atrial flutter. he has failed medical therapy with tikosyn and amiodarone.  He underwent PVI/ CTI ablation by me 12/2018 and did very well for a year.  Unfortunately, his afib has returned.  He has a [employer] for which he uses to follow his afib.  He notes occasional periods of sinus but frequently he is in afib Chads2vasc score is 0.  he is anticoagulated with eliquis . Therapeutic strategies for afib including medicine and repeat ablation were discussed in detail with the patient today.  Unfortunately, he has failed medical therapy with both tikosyn and amiodarone.  Risk, benefits, and alternatives to EP study and radiofrequency ablation for afib were also discussed in detail today. These risks include but are not limited to stroke, bleeding, vascular damage, tamponade, perforation, damage to the esophagus, lungs, and other structures, pulmonary vein stenosis, worsening renal function, and death. The patient understands   these risk and wishes to proceed.  We will therefore proceed with catheter ablation at the next available time.  Carto, ICE, anesthesia are requested for the procedure.  Will also obtain cardiac CT prior to the procedure to exclude LAA thrombus and further evaluate atrial anatomy.  2. Obesity Lifestyle modification is encouraged  3. Prior tachycardia mediated CM Resolved Recent echo reveals preserved EF.  Results of echo discussed at length with him today    Patient Risk:  after full review of this patients clinical status, I feel that they are at high risk at this time.  Today, I have spent 20 minutes with the patient with telehealth technology discussing arrhythmia management .    Signed, Babita Amaker, MD  03/30/2020 2:59 PM     [facility] [address] [place] [address] [phone] (office) [phone] (fax)  

## 2020-03-30 NOTE — H&P (View-Only) (Signed)
Electrophysiology TeleHealth Note   Due to national recommendations of social distancing due to COVID 19, an audio/video telehealth visit is felt to be most appropriate for this patient at this time.  See MyChart message from today for the patient's consent to telehealth for [facility].  Date:  03/30/2020   ID:  [name], DOB [birth year], MRN [id number]  Location: patient's home  Provider location:  [facility]  Evaluation Performed: Follow-up visit  PCP:  [name], MD   Electrophysiologist:  Dr [name]  Chief Complaint:  palpitations  History of Present Illness:    [name] is a 59 y.o. male who presents via telehealth conferencing today.  Since his recent cardioversion, the patient reports doing reasonably well.  He has developed recurrent persistent afib.  He was cardioverted in March but returned to afib subsequently.  He has been documented to have atypical appearing atrial flutter. Though he is mostly unaware, he has previously had tachycardia mediated CM.  He finds that his heart rates are occasionally elevated. Today, he denies symptoms of palpitations, chest pain, shortness of breath,  lower extremity edema, dizziness, presyncope, or syncope.  The patient is otherwise without complaint today.  The patient denies symptoms of fevers, chills, cough, or new SOB worrisome for COVID 19.  Past Medical History:  Diagnosis Date  . Arthritis   . Atrial flutter (HCC)    diagnosed 2005  . Depression   . Diverticular disease   . GERD (gastroesophageal reflux disease)   . Obesity   . Paroxysmal atrial fibrillation (HCC)   . Primary localized osteoarthritis of left knee     Past Surgical History:  Procedure Laterality Date  . ATRIAL FIBRILLATION ABLATION N/A 01/24/2019   Procedure: ATRIAL FIBRILLATION ABLATION;  Surgeon: Hillis Range, MD;  [facility];  Service: Cardiovascular;  Laterality: N/A;  . CARDIOVERSION N/A 12/06/2018   Procedure: CARDIOVERSION;  Surgeon: Parke Poisson, MD;  Location: [facility] ENDOSCOPY;  Service: Cardiovascular;  Laterality: N/A;  . CARDIOVERSION N/A 03/13/2019   Procedure: CARDIOVERSION;  Surgeon: Wendall Stade, MD;  Location: [facility] ENDOSCOPY;  Service: Cardiovascular;  Laterality: N/A;  . CARDIOVERSION N/A 03/05/2020   Procedure: CARDIOVERSION;  Surgeon: Sande Rives, MD;  Location: [facility] ENDOSCOPY;  Service: Cardiovascular;  Laterality: N/A;  . COLON SURGERY  12/07, 5/08, 10/08  . COLONOSCOPY    . COLOSTOMY  2007  . COLOSTOMY REVERSAL    . EYE SURGERY Bilateral    cataracts  . INCISIONAL HERNIA REPAIR  2008   with mesh  . KNEE ARTHROSCOPY Left 5/10   x2  . MULTIPLE TOOTH EXTRACTIONS    . neck fusion  5/11  . REPLANTATION THUMB Right   . TONSILLECTOMY    . TOTAL KNEE ARTHROPLASTY Left 08/23/2017  . TOTAL KNEE ARTHROPLASTY Left 08/23/2017   Procedure: LEFT TOTAL KNEE ARTHROPLASTY;  Surgeon: Salvatore Marvel, MD;  Location: [facility] OR;  Service: Orthopedics;  Laterality: Left;  . WISDOM TOOTH EXTRACTION      Current Outpatient Medications  Medication Sig Dispense Refill  . acetaminophen (TYLENOL) 650 MG CR tablet Take 650-1,300 mg by mouth every 8 (eight) hours as needed (pain.).     Marland Kitchen amiodarone (PACERONE) 200 MG tablet Take 1 tablet by mouth once daily (Patient taking differently: Take 200 mg by mouth every evening. ) 90 tablet 2  . atenolol (TENORMIN) 50 MG tablet TAKE 1 TABLET BY MOUTH TWICE DAILY. (BETA BLOCKER) (Patient taking differently: Take 50 mg  by mouth in the morning and at bedtime. ) 60 tablet 10  . diclofenac (VOLTAREN) 50 MG EC tablet Take 50 mg by mouth daily.     Marland Kitchen diltiazem (CARDIZEM) 120 MG tablet Take 2 tablets (240 mg total) by mouth 3 (three) times daily. (Patient taking differently: Take 120 mg by mouth 3 (three) times daily. ) 540 tablet 3  . ELIQUIS 5 MG TABS tablet Take 1 tablet by mouth twice daily (Patient taking differently: Take 5 mg by mouth in the morning  and at bedtime. ) 60 tablet 5  . gabapentin (NEURONTIN) 100 MG capsule Take 100 mg by mouth daily as needed (nerves).     . methocarbamol (ROBAXIN) 500 MG tablet Take 500 mg by mouth every 8 (eight) hours as needed for muscle spasms.     Marland Kitchen omega-3 acid ethyl esters (LOVAZA) 1 g capsule Take 1 g by mouth 2 (two) times daily.     Marland Kitchen oxycodone-acetaminophen (PERCOCET) 2.5-325 MG tablet Take 1 tablet by mouth in the morning and at bedtime.     . Turmeric (QC TUMERIC COMPLEX) 500 MG CAPS Take 500 mg by mouth in the morning and at bedtime.     No current facility-administered medications for this visit.    Allergies:   Erythromycin, Hydrocodone-acetaminophen, Mobic [meloxicam], Morphine and related, Prednisone, Pregabalin, Z-pak [azithromycin], Lopressor [metoprolol tartrate], and Penicillins   Social History:  The patient  reports that he quit smoking about 7 years ago. His smoking use included cigarettes. He quit after 2.00 years of use. He has never used smokeless tobacco. He reports current alcohol use of about 14.0 standard drinks of alcohol per week. He reports that he does not use drugs.   ROS:  Please see the history of present illness.   All other systems are personally reviewed and negative.    Exam:    Vital Signs:  There were no vitals taken for this visit.  Well sounding and appearing, alert and conversant, regular work of breathing,  good skin color Eyes- anicteric, neuro- grossly intact, skin- no apparent rash or lesions or cyanosis, mouth- oral mucosa is pink  Labs/Other Tests and Data Reviewed:    Recent Labs: 02/27/2020: ALT 39; BUN 28; Creatinine, Ser 1.19; Hemoglobin 16.3; Platelets 163; Potassium 4.8; Sodium 139; TSH 1.869   Wt Readings from Last 3 Encounters:  02/27/20 259 lb 9.6 oz (117.8 kg)  10/09/19 253 lb (114.8 kg)  03/13/19 244 lb (110.7 kg)     ASSESSMENT & PLAN:    1.  Persistent afib/ atypical atrial flutter The patient has symptomatic, recurrent atrial  fibrillation and atrial flutter. he has failed medical therapy with tikosyn and amiodarone.  He underwent PVI/ CTI ablation by me 12/2018 and did very well for a year.  Unfortunately, his afib has returned.  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  He notes occasional periods of sinus but frequently he is in afib Chads2vasc score is 0.  he is anticoagulated with eliquis . Therapeutic strategies for afib including medicine and repeat ablation were discussed in detail with the patient today.  Unfortunately, he has failed medical therapy with both tikosyn and amiodarone.  Risk, benefits, and alternatives to EP study and radiofrequency ablation for afib were also discussed in detail today. These risks include but are not limited to stroke, bleeding, vascular damage, tamponade, perforation, damage to the esophagus, lungs, and other structures, pulmonary vein stenosis, worsening renal function, and death. The patient understands  these risk and wishes to proceed.  We will therefore proceed with catheter ablation at the next available time.  Carto, ICE, anesthesia are requested for the procedure.  Will also obtain cardiac CT prior to the procedure to exclude LAA thrombus and further evaluate atrial anatomy.  2. Obesity Lifestyle modification is encouraged  3. Prior tachycardia mediated CM Resolved Recent echo reveals preserved EF.  Results of echo discussed at length with him today    Patient Risk:  after full review of this patients clinical status, I feel that they are at high risk at this time.  Today, I have spent 20 minutes with the patient with telehealth technology discussing arrhythmia management .    Randolm Idol, MD  03/30/2020 2:59 PM     [facility] [address] [place] [address] [phone] (office) [phone] (fax)

## 2020-03-30 NOTE — Telephone Encounter (Signed)
-----   Message from Hillis Range, MD sent at 03/30/2020  3:12 PM EDT ----- Afib ablation C/I/A  Cardiac CT prior

## 2020-03-31 NOTE — Telephone Encounter (Signed)
Outreach made to pt.  Pt would like to schedule ablation for April 14, 2020 at 10:30 am  Will repeat labs 04/01/20  Covid test scheduled  Instruction letters complete and sent via mychart.  Work up complete.

## 2020-04-01 ENCOUNTER — Other Ambulatory Visit: Payer: 59 | Admitting: *Deleted

## 2020-04-01 DIAGNOSIS — I4891 Unspecified atrial fibrillation: Secondary | ICD-10-CM

## 2020-04-01 LAB — CBC WITH DIFFERENTIAL/PLATELET
Basophils Absolute: 0 10*3/uL (ref 0.0–0.2)
Basos: 0 %
EOS (ABSOLUTE): 0.2 10*3/uL (ref 0.0–0.4)
Eos: 2 %
Hematocrit: 47.1 % (ref 37.5–51.0)
Hemoglobin: 16 g/dL (ref 13.0–17.7)
Lymphocytes Absolute: 2.4 10*3/uL (ref 0.7–3.1)
Lymphs: 28 %
MCH: 31.7 pg (ref 26.6–33.0)
MCHC: 34 g/dL (ref 31.5–35.7)
MCV: 94 fL (ref 79–97)
Monocytes Absolute: 0.8 10*3/uL (ref 0.1–0.9)
Monocytes: 9 %
Neutrophils Absolute: 5.4 10*3/uL (ref 1.4–7.0)
Neutrophils: 61 %
Platelets: 200 10*3/uL (ref 150–450)
RBC: 5.04 x10E6/uL (ref 4.14–5.80)
RDW: 14.5 % (ref 11.6–15.4)
WBC: 8.7 10*3/uL (ref 3.4–10.8)

## 2020-04-01 LAB — BASIC METABOLIC PANEL
BUN/Creatinine Ratio: 26 — ABNORMAL HIGH (ref 9–20)
BUN: 26 mg/dL — ABNORMAL HIGH (ref 6–24)
CO2: 25 mmol/L (ref 20–29)
Calcium: 8.9 mg/dL (ref 8.7–10.2)
Chloride: 107 mmol/L — ABNORMAL HIGH (ref 96–106)
Creatinine, Ser: 0.99 mg/dL (ref 0.76–1.27)
GFR calc Af Amer: 96 mL/min/{1.73_m2} (ref >59)
GFR calc non Af Amer: 83 mL/min/{1.73_m2} (ref >59)
Glucose: 106 mg/dL — ABNORMAL HIGH (ref 65–99)
Potassium: 4.5 mmol/L (ref 3.5–5.2)
Sodium: 139 mmol/L (ref 134–144)

## 2020-04-09 ENCOUNTER — Telehealth (HOSPITAL_COMMUNITY): Payer: Self-pay | Admitting: Emergency Medicine

## 2020-04-09 NOTE — Telephone Encounter (Signed)
Attempted to call patient regarding upcoming cardiac CT appointment. °Left message on voicemail with name and callback number °Sloan Takagi RN Navigator Cardiac Imaging °[facility] °[phone] Office °[phone] Cell ° °

## 2020-04-10 ENCOUNTER — Other Ambulatory Visit: Payer: Self-pay

## 2020-04-10 ENCOUNTER — Ambulatory Visit (HOSPITAL_COMMUNITY)
Admission: RE | Admit: 2020-04-10 | Discharge: 2020-04-10 | Disposition: A | Discharge status: Home / Self Care | Payer: 59 | Source: Ambulatory Visit | Attending: Internal Medicine | Admitting: Internal Medicine

## 2020-04-10 DIAGNOSIS — I4891 Unspecified atrial fibrillation: Secondary | ICD-10-CM | POA: Insufficient documentation

## 2020-04-10 MED ORDER — IOHEXOL 350 MG/ML SOLN
80.0000 mL | Freq: Once | INTRAVENOUS | Status: AC | PRN
  Administered 2020-04-10: 80 mL via INTRAVENOUS

## 2020-04-11 ENCOUNTER — Other Ambulatory Visit (HOSPITAL_COMMUNITY)
Admission: RE | Admit: 2020-04-11 | Discharge: 2020-04-11 | Disposition: A | Discharge status: Home / Self Care | Payer: 59 | Source: Ambulatory Visit | Attending: Internal Medicine | Admitting: Internal Medicine

## 2020-04-11 DIAGNOSIS — Z20822 Contact with and (suspected) exposure to covid-19: Secondary | ICD-10-CM | POA: Diagnosis not present

## 2020-04-11 DIAGNOSIS — Z01812 Encounter for preprocedural laboratory examination: Secondary | ICD-10-CM | POA: Insufficient documentation

## 2020-04-11 LAB — SARS CORONAVIRUS 2 (TAT 6-24 HRS): SARS Coronavirus 2: NEGATIVE

## 2020-04-13 NOTE — Progress Notes (Signed)
Attempted to call patient regarding instructions for procedure for tomorrow.  Left voice mail to arrive at 8:30, have responsible person to drive him home.  Nothing to eat or drink after midnight tonight,  Take both Eliquis doses today, don't take it in the morning.

## 2020-04-14 ENCOUNTER — Ambulatory Visit (HOSPITAL_COMMUNITY): Payer: 59 | Admitting: Anesthesiology

## 2020-04-14 ENCOUNTER — Ambulatory Visit (HOSPITAL_COMMUNITY)
Admission: RE | Admit: 2020-04-14 | Discharge: 2020-04-14 | Disposition: A | Discharge status: Home / Self Care | Payer: 59 | Attending: Internal Medicine | Admitting: Internal Medicine

## 2020-04-14 ENCOUNTER — Encounter (HOSPITAL_COMMUNITY)
Admission: RE | Disposition: A | Discharge status: Home / Self Care | Payer: Self-pay | Source: Home / Self Care | Attending: Internal Medicine

## 2020-04-14 DIAGNOSIS — Z79899 Other long term (current) drug therapy: Secondary | ICD-10-CM | POA: Insufficient documentation

## 2020-04-14 DIAGNOSIS — I4819 Other persistent atrial fibrillation: Secondary | ICD-10-CM | POA: Insufficient documentation

## 2020-04-14 DIAGNOSIS — Z791 Long term (current) use of non-steroidal anti-inflammatories (NSAID): Secondary | ICD-10-CM | POA: Diagnosis not present

## 2020-04-14 DIAGNOSIS — Z96652 Presence of left artificial knee joint: Secondary | ICD-10-CM | POA: Insufficient documentation

## 2020-04-14 DIAGNOSIS — Z7901 Long term (current) use of anticoagulants: Secondary | ICD-10-CM | POA: Diagnosis not present

## 2020-04-14 DIAGNOSIS — I484 Atypical atrial flutter: Secondary | ICD-10-CM | POA: Insufficient documentation

## 2020-04-14 DIAGNOSIS — K219 Gastro-esophageal reflux disease without esophagitis: Secondary | ICD-10-CM | POA: Diagnosis not present

## 2020-04-14 DIAGNOSIS — Z87891 Personal history of nicotine dependence: Secondary | ICD-10-CM | POA: Diagnosis not present

## 2020-04-14 DIAGNOSIS — Z888 Allergy status to other drugs, medicaments and biological substances status: Secondary | ICD-10-CM | POA: Diagnosis not present

## 2020-04-14 DIAGNOSIS — Z880 Allergy status to penicillin: Secondary | ICD-10-CM | POA: Insufficient documentation

## 2020-04-14 DIAGNOSIS — E669 Obesity, unspecified: Secondary | ICD-10-CM | POA: Diagnosis not present

## 2020-04-14 DIAGNOSIS — Z885 Allergy status to narcotic agent status: Secondary | ICD-10-CM | POA: Insufficient documentation

## 2020-04-14 DIAGNOSIS — M1712 Unilateral primary osteoarthritis, left knee: Secondary | ICD-10-CM | POA: Insufficient documentation

## 2020-04-14 DIAGNOSIS — Z881 Allergy status to other antibiotic agents status: Secondary | ICD-10-CM | POA: Diagnosis not present

## 2020-04-14 DIAGNOSIS — Z6838 Body mass index (BMI) 38.0-38.9, adult: Secondary | ICD-10-CM | POA: Insufficient documentation

## 2020-04-14 HISTORY — PX: ATRIAL FIBRILLATION ABLATION: EP1191

## 2020-04-14 LAB — POCT ACTIVATED CLOTTING TIME
Activated Clotting Time: 296 seconds
Activated Clotting Time: 296 seconds
Activated Clotting Time: 334 seconds

## 2020-04-14 SURGERY — ATRIAL FIBRILLATION ABLATION
Anesthesia: General

## 2020-04-14 MED ORDER — PHENYLEPHRINE HCL-NACL 10-0.9 MG/250ML-% IV SOLN
INTRAVENOUS | Status: DC | PRN
  Administered 2020-04-14: 50 ug/min via INTRAVENOUS

## 2020-04-14 MED ORDER — APIXABAN 5 MG PO TABS
5.0000 mg | ORAL_TABLET | Freq: Once | ORAL | Status: AC
  Administered 2020-04-14: 5 mg via ORAL
  Filled 2020-04-14: qty 1

## 2020-04-14 MED ORDER — OXYCODONE-ACETAMINOPHEN 5-325 MG PO TABS
ORAL_TABLET | ORAL | Status: AC
  Filled 2020-04-14: qty 1

## 2020-04-14 MED ORDER — ONDANSETRON HCL 4 MG/2ML IJ SOLN
INTRAMUSCULAR | Status: DC | PRN
  Administered 2020-04-14: 4 mg via INTRAVENOUS

## 2020-04-14 MED ORDER — ROCURONIUM BROMIDE 50 MG/5ML IV SOSY
PREFILLED_SYRINGE | INTRAVENOUS | Status: DC | PRN
  Administered 2020-04-14: 40 mg via INTRAVENOUS
  Administered 2020-04-14: 60 mg via INTRAVENOUS

## 2020-04-14 MED ORDER — PANTOPRAZOLE SODIUM 40 MG PO TBEC: 40.0000 mg | DELAYED_RELEASE_TABLET | Freq: Every day | ORAL | 0 refills | Status: DC

## 2020-04-14 MED ORDER — ACETAMINOPHEN 325 MG PO TABS
ORAL_TABLET | ORAL | Status: AC
  Filled 2020-04-14: qty 2

## 2020-04-14 MED ORDER — LIDOCAINE 2% (20 MG/ML) 5 ML SYRINGE
INTRAMUSCULAR | Status: DC | PRN
  Administered 2020-04-14: 80 mg via INTRAVENOUS

## 2020-04-14 MED ORDER — SODIUM CHLORIDE 0.9 % IV SOLN: 250.0000 mL | INTRAVENOUS | Status: DC | PRN

## 2020-04-14 MED ORDER — SODIUM CHLORIDE 0.9% FLUSH: 3.0000 mL | INTRAVENOUS | Status: DC | PRN

## 2020-04-14 MED ORDER — PROPOFOL 10 MG/ML IV BOLUS
INTRAVENOUS | Status: DC | PRN
  Administered 2020-04-14: 150 mg via INTRAVENOUS

## 2020-04-14 MED ORDER — HEPARIN (PORCINE) IN NACL 1000-0.9 UT/500ML-% IV SOLN
INTRAVENOUS | Status: AC
  Filled 2020-04-14: qty 500

## 2020-04-14 MED ORDER — SODIUM CHLORIDE 0.9 % IV SOLN: INTRAVENOUS | Status: DC

## 2020-04-14 MED ORDER — SUGAMMADEX SODIUM 200 MG/2ML IV SOLN
INTRAVENOUS | Status: DC | PRN
  Administered 2020-04-14: 240 mg via INTRAVENOUS

## 2020-04-14 MED ORDER — OXYCODONE-ACETAMINOPHEN 5-325 MG PO TABS
1.0000 | ORAL_TABLET | Freq: Once | ORAL | Status: AC
  Administered 2020-04-14: 1 via ORAL

## 2020-04-14 MED ORDER — HEPARIN (PORCINE) IN NACL 1000-0.9 UT/500ML-% IV SOLN
INTRAVENOUS | Status: DC | PRN
  Administered 2020-04-14: 500 mL

## 2020-04-14 MED ORDER — DEXAMETHASONE SODIUM PHOSPHATE 10 MG/ML IJ SOLN
INTRAMUSCULAR | Status: DC | PRN
  Administered 2020-04-14: 10 mg via INTRAVENOUS

## 2020-04-14 MED ORDER — ONDANSETRON HCL 4 MG/2ML IJ SOLN: 4.0000 mg | Freq: Four times a day (QID) | INTRAMUSCULAR | Status: DC | PRN

## 2020-04-14 MED ORDER — HEPARIN SODIUM (PORCINE) 1000 UNIT/ML IJ SOLN
INTRAMUSCULAR | Status: DC | PRN
  Administered 2020-04-14: 14000 [IU] via INTRAVENOUS
  Administered 2020-04-14: 1000 [IU] via INTRAVENOUS

## 2020-04-14 MED ORDER — ACETAMINOPHEN 325 MG PO TABS: 650.0000 mg | ORAL_TABLET | ORAL | Status: DC | PRN

## 2020-04-14 MED ORDER — HEPARIN SODIUM (PORCINE) 1000 UNIT/ML IJ SOLN
INTRAMUSCULAR | Status: DC | PRN
  Administered 2020-04-14: 3000 [IU] via INTRAVENOUS
  Administered 2020-04-14: 2000 [IU] via INTRAVENOUS
  Administered 2020-04-14: 5000 [IU] via INTRAVENOUS

## 2020-04-14 MED ORDER — PROTAMINE SULFATE 10 MG/ML IV SOLN
INTRAVENOUS | Status: DC | PRN
  Administered 2020-04-14: 40 mg via INTRAVENOUS

## 2020-04-14 MED ORDER — PHENYLEPHRINE 40 MCG/ML (10ML) SYRINGE FOR IV PUSH (FOR BLOOD PRESSURE SUPPORT)
PREFILLED_SYRINGE | INTRAVENOUS | Status: DC | PRN
  Administered 2020-04-14: 80 ug via INTRAVENOUS

## 2020-04-14 MED ORDER — HEPARIN SODIUM (PORCINE) 1000 UNIT/ML IJ SOLN
INTRAMUSCULAR | Status: AC
  Filled 2020-04-14: qty 2

## 2020-04-14 MED ORDER — SODIUM CHLORIDE 0.9% FLUSH: 3.0000 mL | Freq: Two times a day (BID) | INTRAVENOUS | Status: DC

## 2020-04-14 MED ORDER — FENTANYL CITRATE (PF) 100 MCG/2ML IJ SOLN
INTRAMUSCULAR | Status: DC | PRN
  Administered 2020-04-14 (×2): 50 ug via INTRAVENOUS

## 2020-04-14 MED ORDER — MIDAZOLAM HCL 5 MG/5ML IJ SOLN
INTRAMUSCULAR | Status: DC | PRN
  Administered 2020-04-14: 2 mg via INTRAVENOUS

## 2020-04-14 SURGICAL SUPPLY — 19 items
BLANKET WARM UNDERBOD FULL ACC (MISCELLANEOUS) ×2 IMPLANT
CATH MAPPNG PENTARAY F 2-6-2MM (CATHETERS) IMPLANT
CATH SMTCH THERMOCOOL SF DF (CATHETERS) ×1 IMPLANT
CATH SOUNDSTAR ECO REPROCESSED (CATHETERS) ×2 IMPLANT
CATH WEBSTER BI DIR CS D-F CRV (CATHETERS) ×1 IMPLANT
COVER SWIFTLINK CONNECTOR (BAG) ×2 IMPLANT
DEVICE CLOSURE PERCLS PRGLD 6F (VASCULAR PRODUCTS) IMPLANT
NEEDLE BAYLIS TRANSSEPTAL 71CM (NEEDLE) ×2 IMPLANT
PACK EP LATEX FREE (CUSTOM PROCEDURE TRAY) ×2
PACK EP LF (CUSTOM PROCEDURE TRAY) ×1 IMPLANT
PAD PRO RADIOLUCENT 2001M-C (PAD) ×2 IMPLANT
PATCH CARTO3 (PAD) ×2 IMPLANT
PENTARAY F 2-6-2MM (CATHETERS) ×2
PERCLOSE PROGLIDE 6F (VASCULAR PRODUCTS) ×8
SHEATH PINNACLE 7F 10CM (SHEATH) ×2 IMPLANT
SHEATH PINNACLE 9F 10CM (SHEATH) ×1 IMPLANT
SHEATH PROBE COVER 6X72 (BAG) ×1 IMPLANT
SHEATH SWARTZ TS SL2 63CM 8.5F (SHEATH) ×2 IMPLANT
TUBING SMART ABLATE COOLFLOW (TUBING) ×1 IMPLANT

## 2020-04-14 NOTE — Anesthesia Preprocedure Evaluation (Addendum)
Anesthesia Evaluation  Patient identified by MRN, date of birth, ID band Patient awake    Reviewed: Allergy & Precautions, NPO status , Patient's Chart, lab work & pertinent test results  History of Anesthesia Complications Negative for: history of anesthetic complications  Airway Mallampati: II  TM Distance: >3 FB Neck ROM: Full    Dental  (+) Dental Advisory Given   Pulmonary former smoker,    Pulmonary exam normal        Cardiovascular Normal cardiovascular exam+ dysrhythmias Atrial Fibrillation    '21 TTE -EF 55 to 60%. RV systolic function is mildly reduced. The right ventricular size is mildly enlarged. RA size was mildly dilated. Mild MR.    Neuro/Psych PSYCHIATRIC DISORDERS Depression negative neurological ROS     GI/Hepatic GERD  Controlled,(+)     substance abuse  alcohol use,   Endo/Other   Obesity   Renal/GU negative Renal ROS     Musculoskeletal  (+) Arthritis ,   Abdominal   Peds  Hematology  On eliquis    Anesthesia Other Findings Covid neg 4/17   Reproductive/Obstetrics                            Anesthesia Physical Anesthesia Plan  ASA: III  Anesthesia Plan: General   Post-op Pain Management:    Induction: Intravenous  PONV Risk Score and Plan: 2 and Treatment may vary due to age or medical condition, Ondansetron, Dexamethasone and Midazolam  Airway Management Planned: Oral ETT  Additional Equipment: None  Intra-op Plan:   Post-operative Plan: Extubation in OR  Informed Consent: I have reviewed the patients History and Physical, chart, labs and discussed the procedure including the risks, benefits and alternatives for the proposed anesthesia with the patient or authorized representative who has indicated his/her understanding and acceptance.     Dental advisory given  Plan Discussed with: CRNA and Anesthesiologist  Anesthesia Plan Comments:         Anesthesia Quick Evaluation

## 2020-04-14 NOTE — Discharge Instructions (Signed)
Cardiac Ablation, Care After This sheet gives you information about how to care for yourself after your procedure. Your health care provider may also give you more specific instructions. If you have problems or questions, contact your health care provider. What can I expect after the procedure? After the procedure, it is common to have:  Bruising around your puncture site.  Tenderness around your puncture site.  Skipped heartbeats.  Tiredness (fatigue). Follow these instructions at home: Puncture site care   Follow instructions from your health care provider about how to take care of your puncture site. Make sure you: ? Wash your hands with soap and water before you change your bandage (dressing). If soap and water are not available, use hand sanitizer. ? Change your dressing as told by your health care provider. ? Leave stitches (sutures), skin glue, or adhesive strips in place. These skin closures may need to stay in place for up to 2 weeks. If adhesive strip edges start to loosen and curl up, you may trim the loose edges. Do not remove adhesive strips completely unless your health care provider tells you to do that.  Check your puncture site every day for signs of infection. Check for: ? Redness, swelling, or pain. ? Fluid or blood. If your puncture site starts to bleed, lie down on your back, apply firm pressure to the area, and contact your health care provider. ? Warmth. ? Pus or a bad smell. Driving  Ask your health care provider when it is safe for you to drive again after the procedure.  Do not drive or use heavy machinery while taking prescription pain medicine.  Do not drive for 24 hours if you were given a medicine to help you relax (sedative) during your procedure. Activity  Avoid activities that take a lot of effort for at least 3 days after your procedure.  Do not lift anything that is heavier than 10 lb (4.5 kg), or the limit that you are told, until your health  care provider says that it is safe.  Return to your normal activities as told by your health care provider. Ask your health care provider what activities are safe for you. General instructions  Take over-the-counter and prescription medicines only as told by your health care provider.  Do not use any products that contain nicotine or tobacco, such as cigarettes and e-cigarettes. If you need help quitting, ask your health care provider.  Do not take baths, swim, or use a hot tub until your health care provider approves.  Do not drink alcohol for 24 hours after your procedure.  Keep all follow-up visits as told by your health care provider. This is important. Contact a health care provider if:  You have redness, mild swelling, or pain around your puncture site.  You have fluid or blood coming from your puncture site that stops after applying firm pressure to the area.  Your puncture site feels warm to the touch.  You have pus or a bad smell coming from your puncture site.  You have a fever.  You have chest pain or discomfort that spreads to your neck, jaw, or arm.  You are sweating a lot.  You feel nauseous.  You have a fast or irregular heartbeat.  You have shortness of breath.  You are dizzy or light-headed and feel the need to lie down.  You have pain or numbness in the arm or leg closest to your puncture site. Get help right away if:  Your puncture   site suddenly swells.  Your puncture site is bleeding and the bleeding does not stop after applying firm pressure to the area. These symptoms may represent a serious problem that is an emergency. Do not wait to see if the symptoms will go away. Get medical help right away. Call your local emergency services (911 in the [place] Do not drive yourself to the hospital. Summary  After the procedure, it is normal to have bruising and tenderness at the puncture site in your groin, neck, or forearm.  Check your puncture site every  day for signs of infection.  Get help right away if your puncture site is bleeding and the bleeding does not stop after applying firm pressure to the area. This is a medical emergency. This information is not intended to replace advice given to you by your health care provider. Make sure you discuss any questions you have with your health care provider. Document Revised: 11/24/2017 Document Reviewed: 03/23/2017 Elsevier Patient Education  2020 Elsevier Inc.  

## 2020-04-14 NOTE — Transfer of Care (Signed)
Immediate Anesthesia Transfer of Care Note  Patient: [name]  Procedure(s) Performed: ATRIAL FIBRILLATION ABLATION (N/A )  Patient Location: Cath Lab  Anesthesia Type:General  Level of Consciousness: drowsy and patient cooperative  Airway & Oxygen Therapy: Patient Spontanous Breathing and Patient connected to face mask oxygen  Post-op Assessment: Report given to RN and Post -op Vital signs reviewed and stable  Post vital signs: Reviewed and stable  Last Vitals:  Vitals Value Taken Time  BP    Temp    Pulse    Resp    SpO2      Last Pain:  Vitals:   04/14/20 0934  TempSrc: Skin  PainSc:          Complications: No apparent anesthesia complications

## 2020-04-14 NOTE — Anesthesia Procedure Notes (Signed)
Procedure Name: Intubation Date/Time: 04/14/2020 11:11 AM Performed by: Rosiland Oz, CRNA Pre-anesthesia Checklist: Patient identified, Emergency Drugs available, Suction available, Patient being monitored and Timeout performed Patient Re-evaluated:Patient Re-evaluated prior to induction Oxygen Delivery Method: Circle system utilized Preoxygenation: Pre-oxygenation with 100% oxygen Induction Type: IV induction Ventilation: Mask ventilation without difficulty and Oral airway inserted - appropriate to patient size Laryngoscope Size: Hyacinth Meeker and 2 Grade View: Grade I Tube type: Oral Tube size: 7.5 mm Number of attempts: 1 Airway Equipment and Method: Stylet Placement Confirmation: ETT inserted through vocal cords under direct vision,  positive ETCO2 and breath sounds checked- equal and bilateral Secured at: 22 cm Tube secured with: Tape Dental Injury: Teeth and Oropharynx as per pre-operative assessment

## 2020-04-14 NOTE — Progress Notes (Signed)
Up and walked and tolerated well; right groin stable no bleeding or hematoma 

## 2020-04-14 NOTE — Interval H&P Note (Signed)
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  The various methods of treatment have been discussed with the patient and family. After consideration of risks, benefits and other options for treatment, the patient has consented to  Procedure(s): ATRIAL FIBRILLATION ABLATION (N/A) as a surgical intervention.  The patient's history has been reviewed, patient examined, no change in status, stable for surgery.  I have reviewed the patient's chart and labs.  Questions were answered to the patient's satisfaction.   He reports compliance with eliquis without interruption.  Cardiac CT reviewed with him at length, including coronary calcification.   Hillis Range

## 2020-04-14 NOTE — Anesthesia Postprocedure Evaluation (Signed)
Anesthesia Post Note  Patient: [name]  Procedure(s) Performed: ATRIAL FIBRILLATION ABLATION (N/A )     Patient location during evaluation: PACU Anesthesia Type: General Level of consciousness: awake and alert Pain management: pain level controlled Vital Signs Assessment: post-procedure vital signs reviewed and stable Respiratory status: spontaneous breathing, nonlabored ventilation and respiratory function stable Cardiovascular status: blood pressure returned to baseline and stable Postop Assessment: no apparent nausea or vomiting Anesthetic complications: no    Last Vitals:  Vitals:   04/14/20 1505 04/14/20 1510  BP: 134/69 130/73  Pulse: 61 65  Resp: 10 11  Temp:  36.8 C  SpO2: 96% 93%    Last Pain:  Vitals:   04/14/20 1511  TempSrc:   PainSc: 6                  Beryle Lathe

## 2020-05-11 ENCOUNTER — Other Ambulatory Visit: Payer: Self-pay | Admitting: Internal Medicine

## 2020-05-11 NOTE — Telephone Encounter (Addendum)
This message was sent after 30 day supply of Eliquis with refill was received and processed. So called the pharmacy and gave a verbal for Eliquis 5mg  twice a day, 90 day supply with a refill and they will take care of that without any issue per the Pharmacist.  Pt was updated that the refill was processed and he states he does not need a 90 day supply but he will speak with the pharmacy about what he will pick up. Advised in the future please notify them at least a week before running out and he verbalized understanding. Nothing else was needed by the pt and he was appreciative for the call back.

## 2020-05-11 NOTE — Telephone Encounter (Signed)
*  STAT* If patient is at the pharmacy, call can be transferred to refill team.   1. Which medications need to be refilled? (please list name of each medication and dose if known) ELIQUIS 5 MG TABS tablet  2. Which pharmacy/location (including street and city if local pharmacy) is medication to be sent to? [facility] [address] [place] - [address]  3. Do they need a 30 day or 90 day supply? 90    Patient is out of medication.

## 2020-05-11 NOTE — Telephone Encounter (Signed)
Eliquis 5mg  refill request received. Patient is 59 years old, weight-113.4kg, Crea-0.99 on 04/01/2020, Diagnosis-Afib, and last seen by Dr. 06/01/2020 on 03/30/2020 via Telemedicine. Dose is appropriate based on dosing criteria. Will send in refill to requested pharmacy.

## 2020-05-11 NOTE — Progress Notes (Signed)
Primary Care Physician: Harlan Stains, MD Referring Physician: Dr. Blinda Leatherwood is a 59 y.o. male with a h/o paroxysmal afib , s/p ablation 12/2018, that is in the afib clinic, 02/27/20 for Kardia strips showing pt to be out of rhythm for about a week. His EKG shows aflutter with 4:1 conduction. He feels no different out of rhythm. 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 He continues on eliquis for a CHA2DS2VASc score of 1.  Follow up in the AF clinic 05/11/20. Patient is s/p afib and left atrial flutter ablation with Dr Rayann Heman on 04/14/20. He reports that he has done reasonably well since the procedure. He has noticed episodes of afib on his Jodelle Red but this has mostly shown SR. He did not some swelling and weight gain but he decreased his diltiazem and this has resolved. He denies CP, swallowing, or groin issues.   Today, he denies symptoms of palpitations, chest pain, shortness of breath, orthopnea, PND, lower extremity edema, dizziness, presyncope, syncope, or neurologic sequela. The patient is tolerating medications without difficulties and is otherwise without complaint today.   Past Medical History:  Diagnosis Date  . Arthritis   . Atrial flutter ([place])    diagnosed 2005  . Depression   . Diverticular disease   . GERD (gastroesophageal reflux disease)   . Obesity   . Paroxysmal atrial fibrillation ([facility])   . Primary localized osteoarthritis of left knee    Past Surgical History:  Procedure Laterality Date  . ATRIAL FIBRILLATION ABLATION N/A 01/24/2019   Procedure: ATRIAL FIBRILLATION ABLATION;  Surgeon: Thompson Grayer, MD;  Location: [place];  Service: Cardiovascular;  Laterality: N/A;  . ATRIAL FIBRILLATION ABLATION N/A 04/14/2020   Procedure: ATRIAL FIBRILLATION ABLATION;  Surgeon: Thompson Grayer, MD;  Location: [place];  Service: Cardiovascular;  Laterality: N/A;  . CARDIOVERSION N/A  12/06/2018   Procedure: CARDIOVERSION;  Surgeon: Elouise Munroe, MD;  Location: [facility] ENDOSCOPY;  Service: Cardiovascular;  Laterality: N/A;  . CARDIOVERSION N/A 03/13/2019   Procedure: CARDIOVERSION;  Surgeon: Josue Hector, MD;  Location: [facility] ENDOSCOPY;  Service: Cardiovascular;  Laterality: N/A;  . CARDIOVERSION N/A 03/05/2020   Procedure: CARDIOVERSION;  Surgeon: Geralynn Rile, MD;  Location: [facility] ENDOSCOPY;  Service: Cardiovascular;  Laterality: N/A;  . COLON SURGERY  12/07, 5/08, 10/08  . COLONOSCOPY    . COLOSTOMY  2007  . COLOSTOMY REVERSAL    . EYE SURGERY Bilateral    cataracts  . INCISIONAL HERNIA REPAIR  2008   with mesh  . KNEE ARTHROSCOPY Left 5/10   x2  . MULTIPLE TOOTH EXTRACTIONS    . neck fusion  5/11  . REPLANTATION THUMB Right   . TONSILLECTOMY    . TOTAL KNEE ARTHROPLASTY Left 08/23/2017  . TOTAL KNEE ARTHROPLASTY Left 08/23/2017   Procedure: LEFT TOTAL KNEE ARTHROPLASTY;  Surgeon: Elsie Saas, MD;  Location: [facility];  Service: Orthopedics;  Laterality: Left;  . WISDOM TOOTH EXTRACTION      Current Outpatient Medications  Medication Sig Dispense Refill  . acetaminophen (TYLENOL) 650 MG CR tablet Take 650-1,300 mg by mouth every 8 (eight) hours as needed (pain.).     Marland Kitchen amiodarone (PACERONE) 200 MG tablet Take 1 tablet by mouth once daily 90 tablet 2  . atenolol (TENORMIN) 50 MG tablet TAKE 1 TABLET BY MOUTH TWICE DAILY. (BETA BLOCKER) 60 tablet 10  . diclofenac (VOLTAREN)  50 MG EC tablet Take 50 mg by mouth daily.     Marland Kitchen diltiazem (CARDIZEM) 120 MG tablet Take 2 tablets (240 mg total) by mouth 3 (three) times daily. 540 tablet 3  . ELIQUIS 5 MG TABS tablet Take 1 tablet by mouth twice daily 60 tablet 5  . gabapentin (NEURONTIN) 100 MG capsule Take 100 mg by mouth daily as needed (nerves).     . methocarbamol (ROBAXIN) 500 MG tablet Take 500 mg by mouth every 8 (eight) hours as needed for muscle spasms.     Marland Kitchen oxyCODONE-acetaminophen (PERCOCET/ROXICET) 5-325  MG tablet Take 1 tablet by mouth 2 (two) times daily.    . sildenafil (REVATIO) 20 MG tablet SMARTSIG:0-5 Tablet(s) By Mouth As Directed    . traMADol (ULTRAM) 50 MG tablet Take 100 mg by mouth every 6 (six) hours.    . Turmeric (QC TUMERIC COMPLEX) 500 MG CAPS Take 500 mg by mouth in the morning and at bedtime.     No current facility-administered medications for this encounter.    Allergies  Allergen Reactions  . Erythromycin Other (See Comments)    Abdominal cramps   . Hydrocodone-Acetaminophen Other (See Comments)    Makes him feel "high"  . Mobic [Meloxicam] Other (See Comments)    Lower back and kidney pain   . Prednisone Hives, Anxiety and Other (See Comments)    Makes him anxious  . Pregabalin Other (See Comments)    Dry mouth and out-of-body feeling  . Z-Pak [Azithromycin] Nausea Only and Other (See Comments)    Severe stomach cramps   . Lopressor [Metoprolol Tartrate] Swelling  . Penicillins Rash and Other (See Comments)    Has patient had a PCN reaction causing immediate rash, facial/tongue/throat swelling, SOB or lightheadedness with hypotension: No Has patient had a PCN reaction causing severe rash involving mucus membranes or skin necrosis: No Has patient had a PCN reaction that required hospitalization: No Has patient had a PCN reaction occurring within the last 10 years: No If all of the above answers are "NO", then may proceed with Cephalosporin use.      Social History   Socioeconomic History  . Marital status: Married    Spouse name: Not on file  . Number of children: Not on file  . Years of education: Not on file  . Highest education level: Not on file  Occupational History  . Not on file  Tobacco Use  . Smoking status: Former Smoker    Years: 2.00    Types: Cigarettes    Quit date: 08/11/2012    Years since quitting: 7.7  . Smokeless tobacco: Never Used  Substance and Sexual Activity  . Alcohol use: Yes    Alcohol/week: 14.0 standard drinks     Types: 14 Cans of beer per week    Comment: 2 beers/ day  . Drug use: No  . Sexual activity: Not on file    Comment: married ([name])  Other Topics Concern  . Not on file  Social History Narrative  . Not on file   Social Determinants of Health   Financial Resource Strain:   . Difficulty of Paying Living Expenses:   Food Insecurity:   . Worried About Programme researcher, broadcasting/film/video in the Last Year:   . Barista in the Last Year:   Transportation Needs:   . Freight forwarder (Medical):   Marland Kitchen Lack of Transportation (Non-Medical):   Physical Activity:   . Days of Exercise per  Week:   . Minutes of Exercise per Session:   Stress:   . Feeling of Stress :   Social Connections:   . Frequency of Communication with Friends and Family:   . Frequency of Social Gatherings with Friends and Family:   . Attends Religious Services:   . Active Member of Clubs or Organizations:   . Attends Banker Meetings:   Marland Kitchen Marital Status:   Intimate Partner Violence:   . Fear of Current or Ex-Partner:   . Emotionally Abused:   Marland Kitchen Physically Abused:   . Sexually Abused:     Family History  Problem Relation Age of Onset  . Hypertension Mother   . CAD Father   . Hypertension Father   . Diabetes Father   . Colon polyps Father   . CAD Sister   . Cancer Paternal Grandfather        brain and lung    ROS- All systems are reviewed and negative except as per the HPI above  Physical Exam: Vitals:   05/12/20 0857  BP: 110/74  Pulse: (!) 55  Weight: 119.5 kg  Height: 5\' 9"  (1.753 m)   Wt Readings from Last 3 Encounters:  05/12/20 119.5 kg  04/14/20 113.4 kg  02/27/20 117.8 kg    Labs: Lab Results  Component Value Date   NA 139 04/01/2020   K 4.5 04/01/2020   CL 107 (H) 04/01/2020   CO2 25 04/01/2020   GLUCOSE 106 (H) 04/01/2020   BUN 26 (H) 04/01/2020   CREATININE 0.99 04/01/2020   CALCIUM 8.9 04/01/2020   PHOS 3.5 11/01/2007   MG 2.0 12/07/2018   Lab Results    Component Value Date   INR 0.95 11/18/2014   Lab Results  Component Value Date   CHOL  10/29/2007    100        ATP III CLASSIFICATION:  <200     mg/dL   Desirable  13/02/2007  mg/dL   Borderline High  086-761    mg/dL   High   TRIG >=950 932    GEN- The patient is well appearing obese male, alert and oriented x 3 today.   HEENT-head normocephalic, atraumatic, sclera clear, conjunctiva pink, hearing intact, trachea midline. Lungs- Clear to ausculation bilaterally, normal work of breathing Heart- irregular rate and rhythm, no murmurs, rubs or gallops  GI- soft, NT, ND, + BS Extremities- no clubbing, cyanosis, or edema MS- no significant deformity or atrophy Skin- no rash or lesion Psych- euthymic mood, full affect Neuro- strength and sensation are intact   EKG- atypical atrial flutter with variable block HR 55, QRS 94, QTc 413   Assessment and Plan: 1. Atrial fib/atrial flutter S/p ablation 12/2018 with repeat ablation on 04/14/20. Patient having paroxysms of afib lasting <24 hours. Asymptomatic. Reassured patient that it is normal to have episodes of afib for the first 3 months post ablation. Instructed patient to call office if he feels he is persistently in afib for several days. Continue amiodarone  200 mg daily Continue diltiazem 120 mg TID Continue atenolol 50 mg bid   2. CHA2DS2VASc score of 1 (LV dysfunction) Continue eliquis 5 mg bid for at least 3 months post ablation with no missed doses.   3. Obesity Body mass index is 38.9 kg/m. Lifestyle modification was discussed and encouraged including regular physical activity and weight reduction.   Follow up with Dr 04/16/20 as scheduled.    [name] PA-C [facility] [address] [place], [place] [address] [phone]

## 2020-05-12 ENCOUNTER — Encounter (HOSPITAL_COMMUNITY): Payer: Self-pay | Admitting: Physician Assistant

## 2020-05-12 ENCOUNTER — Ambulatory Visit (HOSPITAL_COMMUNITY)
Admission: RE | Admit: 2020-05-12 | Discharge: 2020-05-12 | Disposition: A | Discharge status: Home / Self Care | Payer: 59 | Source: Ambulatory Visit | Attending: Physician Assistant | Admitting: Physician Assistant

## 2020-05-12 ENCOUNTER — Other Ambulatory Visit: Payer: Self-pay

## 2020-05-12 VITALS — BP 110/74 | HR 55 | Ht 69.0 in | Wt 263.4 lb

## 2020-05-12 DIAGNOSIS — I480 Paroxysmal atrial fibrillation: Secondary | ICD-10-CM | POA: Insufficient documentation

## 2020-05-12 DIAGNOSIS — Z880 Allergy status to penicillin: Secondary | ICD-10-CM | POA: Insufficient documentation

## 2020-05-12 DIAGNOSIS — Z7901 Long term (current) use of anticoagulants: Secondary | ICD-10-CM | POA: Diagnosis not present

## 2020-05-12 DIAGNOSIS — Z6838 Body mass index (BMI) 38.0-38.9, adult: Secondary | ICD-10-CM | POA: Insufficient documentation

## 2020-05-12 DIAGNOSIS — Z8249 Family history of ischemic heart disease and other diseases of the circulatory system: Secondary | ICD-10-CM | POA: Insufficient documentation

## 2020-05-12 DIAGNOSIS — Z888 Allergy status to other drugs, medicaments and biological substances status: Secondary | ICD-10-CM | POA: Insufficient documentation

## 2020-05-12 DIAGNOSIS — K219 Gastro-esophageal reflux disease without esophagitis: Secondary | ICD-10-CM | POA: Diagnosis not present

## 2020-05-12 DIAGNOSIS — I484 Atypical atrial flutter: Secondary | ICD-10-CM

## 2020-05-12 DIAGNOSIS — I4892 Unspecified atrial flutter: Secondary | ICD-10-CM | POA: Insufficient documentation

## 2020-05-12 DIAGNOSIS — Z87891 Personal history of nicotine dependence: Secondary | ICD-10-CM | POA: Diagnosis not present

## 2020-05-12 DIAGNOSIS — Z79899 Other long term (current) drug therapy: Secondary | ICD-10-CM | POA: Insufficient documentation

## 2020-05-12 DIAGNOSIS — E669 Obesity, unspecified: Secondary | ICD-10-CM | POA: Diagnosis not present

## 2020-05-12 DIAGNOSIS — Z881 Allergy status to other antibiotic agents status: Secondary | ICD-10-CM | POA: Diagnosis not present

## 2020-05-13 ENCOUNTER — Other Ambulatory Visit (HOSPITAL_COMMUNITY): Payer: Self-pay | Admitting: *Deleted

## 2020-05-13 MED ORDER — DILTIAZEM HCL 120 MG PO TABS: 120.0000 mg | ORAL_TABLET | Freq: Three times a day (TID) | ORAL | 3 refills | Status: DC

## 2020-05-18 ENCOUNTER — Other Ambulatory Visit (HOSPITAL_COMMUNITY): Payer: Self-pay | Admitting: Nurse Practitioner

## 2020-07-13 ENCOUNTER — Ambulatory Visit: Payer: 59 | Admitting: Internal Medicine

## 2020-07-20 ENCOUNTER — Other Ambulatory Visit: Payer: Self-pay | Admitting: Orthopaedic Surgery

## 2020-07-20 ENCOUNTER — Ambulatory Visit: Payer: 59 | Admitting: Internal Medicine

## 2020-07-20 DIAGNOSIS — M47816 Spondylosis without myelopathy or radiculopathy, lumbar region: Secondary | ICD-10-CM

## 2020-08-15 ENCOUNTER — Other Ambulatory Visit: Payer: 59

## 2020-08-21 ENCOUNTER — Other Ambulatory Visit: Payer: Self-pay

## 2020-08-21 ENCOUNTER — Encounter: Payer: Self-pay | Admitting: Internal Medicine

## 2020-08-21 ENCOUNTER — Ambulatory Visit (INDEPENDENT_AMBULATORY_CARE_PROVIDER_SITE_OTHER): Payer: 59 | Admitting: Internal Medicine

## 2020-08-21 VITALS — BP 130/76 | HR 82 | Ht 69.0 in | Wt 259.0 lb

## 2020-08-21 DIAGNOSIS — I4819 Other persistent atrial fibrillation: Secondary | ICD-10-CM

## 2020-08-21 DIAGNOSIS — I484 Atypical atrial flutter: Secondary | ICD-10-CM

## 2020-08-21 NOTE — Patient Instructions (Addendum)
Medication Instructions:  Your physician recommends that you continue on your current medications as directed. Please refer to the Current Medication list given to you today.  *If you need a refill on your cardiac medications before your next appointment, please call your pharmacy*  Lab Work: None ordered.  If you have labs (blood work) drawn today and your tests are completely normal, you will receive your results only by: Marland Kitchen MyChart Message (if you have MyChart) OR . A paper copy in the mail If you have any lab test that is abnormal or we need to change your treatment, we will call you to review the results.  Testing/Procedures: None ordered.  Follow-Up: At [facility], you and your health needs are our priority.  As part of our continuing mission to provide you with exceptional heart care, we have created designated Provider Care Teams.  These Care Teams include your primary Cardiologist (physician) and Advanced Practice Providers (APPs -  Physician Assistants and Nurse Practitioners) who all work together to provide you with the care you need, when you need it.  We recommend signing up for the patient portal called "MyChart".  Sign up information is provided on this After Visit Summary.  MyChart is used to connect with patients for Virtual Visits (Telemedicine).  Patients are able to view lab/test results, encounter notes, upcoming appointments, etc.  Non-urgent messages can be sent to your provider as well.   To learn more about what you can do with MyChart, go to [email or web address].    Your next appointment:   Your physician wants you to follow-up in: 4 wks at the afib clinic. You will receive a reminder letter in the mail two months in advance. If you don't receive a letter, please call our office to schedule the follow-up appointment.  11/16/20 10 am at the [address] office  Other Instructions:

## 2020-08-21 NOTE — Progress Notes (Signed)
PCP: [name], MD    [name] is a 59 y.o. male who presents today for routine electrophysiology followup.  Since his recent afib ablation, the patient reports doing very well.  he denies procedure related complications and is pleased with the results of the procedure.  Today, he denies symptoms of palpitations, chest pain, shortness of breath,  lower extremity edema, dizziness, presyncope, or syncope.  The patient is otherwise without complaint today.   Past Medical History:  Diagnosis Date  . Arthritis   . Atrial flutter (HCC)    diagnosed 2005  . Depression   . Diverticular disease   . GERD (gastroesophageal reflux disease)   . Obesity   . Paroxysmal atrial fibrillation (HCC)   . Primary localized osteoarthritis of left knee    Past Surgical History:  Procedure Laterality Date  . ATRIAL FIBRILLATION ABLATION N/A 01/24/2019   Procedure: ATRIAL FIBRILLATION ABLATION;  Surgeon: Hillis Range, MD;  [facility];  Service: Cardiovascular;  Laterality: N/A;  . ATRIAL FIBRILLATION ABLATION N/A 04/14/2020   Procedure: ATRIAL FIBRILLATION ABLATION;  Surgeon: Hillis Range, MD;  Location: [facility];  Service: Cardiovascular;  Laterality: N/A;  . CARDIOVERSION N/A 12/06/2018   Procedure: CARDIOVERSION;  Surgeon: Parke Poisson, MD;  Location: [facility] ENDOSCOPY;  Service: Cardiovascular;  Laterality: N/A;  . CARDIOVERSION N/A 03/13/2019   Procedure: CARDIOVERSION;  Surgeon: Wendall Stade, MD;  Location: [facility] ENDOSCOPY;  Service: Cardiovascular;  Laterality: N/A;  . CARDIOVERSION N/A 03/05/2020   Procedure: CARDIOVERSION;  Surgeon: Sande Rives, MD;  Location: [facility] ENDOSCOPY;  Service: Cardiovascular;  Laterality: N/A;  . COLON SURGERY  12/07, 5/08, 10/08  . COLONOSCOPY    . COLOSTOMY  2007  . COLOSTOMY REVERSAL    . EYE SURGERY Bilateral    cataracts  . INCISIONAL HERNIA REPAIR  2008   with mesh  . KNEE ARTHROSCOPY Left 5/10   x2  . MULTIPLE  TOOTH EXTRACTIONS    . neck fusion  5/11  . REPLANTATION THUMB Right   . TONSILLECTOMY    . TOTAL KNEE ARTHROPLASTY Left 08/23/2017  . TOTAL KNEE ARTHROPLASTY Left 08/23/2017   Procedure: LEFT TOTAL KNEE ARTHROPLASTY;  Surgeon: Salvatore Marvel, MD;  Location: [facility] OR;  Service: Orthopedics;  Laterality: Left;  . WISDOM TOOTH EXTRACTION      ROS- all systems are personally reviewed and negatives except as per HPI above  Current Outpatient Medications  Medication Sig Dispense Refill  . acetaminophen (TYLENOL) 650 MG CR tablet Take 650-1,300 mg by mouth every 8 (eight) hours as needed (pain.).     Marland Kitchen atenolol (TENORMIN) 50 MG tablet TAKE 1 TABLET BY MOUTH TWICE DAILY. (BETA BLOCKER) 60 tablet 10  . diclofenac (VOLTAREN) 50 MG EC tablet Take 50 mg by mouth daily.     Marland Kitchen diltiazem (CARDIZEM) 120 MG tablet Take 1 tablet (120 mg total) by mouth 3 (three) times daily. 90 tablet 3  . ELIQUIS 5 MG TABS tablet Take 1 tablet by mouth twice daily 60 tablet 5  . gabapentin (NEURONTIN) 100 MG capsule Take 100 mg by mouth daily as needed (nerves).     . methocarbamol (ROBAXIN) 500 MG tablet Take 500 mg by mouth every 8 (eight) hours as needed for muscle spasms.     Marland Kitchen oxyCODONE-acetaminophen (PERCOCET/ROXICET) 5-325 MG tablet Take 1 tablet by mouth 2 (two) times daily.    Marland Kitchen PACERONE 200 MG tablet Take 1 tablet by mouth once daily 90 tablet  3  . sildenafil (REVATIO) 20 MG tablet SMARTSIG:0-5 Tablet(s) By Mouth As Directed    . traMADol (ULTRAM) 50 MG tablet Take 100 mg by mouth every 6 (six) hours.    . Turmeric (QC TUMERIC COMPLEX) 500 MG CAPS Take 500 mg by mouth in the morning and at bedtime.     No current facility-administered medications for this visit.    Physical Exam: Vitals:   08/21/20 1034  BP: 130/76  Pulse: 82  SpO2: 93%  Weight: 259 lb (117.5 kg)  Height: 5\' 9"  (1.753 m)    GEN- The patient is well appearing, alert and oriented x 3 today.   Head- normocephalic, atraumatic Eyes-   Sclera clear, conjunctiva pink Ears- hearing intact Oropharynx- clear Lungs-  normal work of breathing Heart- irregular rate and rhythm  GI- soft  Extremities- no clubbing, cyanosis, or edema  EKG tracing ordered today is personally reviewed and shows atypical atrial flutter  Assessment and Plan:  1. Persistent atrial fibrillation Doing well s/p ablation He is in atypical atrial flutter today I have reviewed his transmissions which confirm that he appears to be in this persistantly.  He is not aware and is mostly rate controlled Continue eliquis and rate control Follow-up in AF clinic in 4 weeks.  If still in atypical flutter then, I would advise [facility].  2. Overweight Body mass index is 38.25 kg/m. Lifestyle modification is advised  3. Tachycardia mediated CM Resolved with sinus rhythm  AF clinic in 4 weeks Return to see me in 3 months  [facility] MD, [facility] 08/21/2020 10:47 AM

## 2020-09-08 ENCOUNTER — Telehealth (HOSPITAL_COMMUNITY): Payer: Self-pay | Admitting: *Deleted

## 2020-09-08 MED ORDER — DILTIAZEM HCL 120 MG PO TABS: 60.0000 mg | ORAL_TABLET | Freq: Three times a day (TID) | ORAL | 3 refills | Status: DC

## 2020-09-08 NOTE — Telephone Encounter (Signed)
Pt called in stating his BP is running low in the mornings and feels very tired/sluggish better as the day progresses. BP 86/54. Discussed with [name] PA will try decreasing morning dose of cardizem to 1/2 tab if symptoms do not improve he will hold AM morning dose. Has follow up in few weeks but will call if issues arise prior to appt.

## 2020-09-10 ENCOUNTER — Other Ambulatory Visit: Payer: Self-pay | Admitting: Orthopaedic Surgery

## 2020-09-15 ENCOUNTER — Other Ambulatory Visit: Payer: 59

## 2020-09-17 ENCOUNTER — Other Ambulatory Visit: Payer: 59

## 2020-09-29 ENCOUNTER — Encounter (HOSPITAL_COMMUNITY): Payer: Self-pay | Admitting: Nurse Practitioner

## 2020-09-29 ENCOUNTER — Ambulatory Visit (HOSPITAL_COMMUNITY)
Admission: RE | Admit: 2020-09-29 | Discharge: 2020-09-29 | Disposition: A | Discharge status: Home / Self Care | Payer: 59 | Source: Ambulatory Visit | Attending: Nurse Practitioner | Admitting: Nurse Practitioner

## 2020-09-29 ENCOUNTER — Other Ambulatory Visit: Payer: Self-pay

## 2020-09-29 VITALS — BP 122/70 | HR 56 | Ht 69.0 in | Wt 257.6 lb

## 2020-09-29 DIAGNOSIS — I484 Atypical atrial flutter: Secondary | ICD-10-CM | POA: Diagnosis not present

## 2020-09-29 DIAGNOSIS — I480 Paroxysmal atrial fibrillation: Secondary | ICD-10-CM | POA: Diagnosis present

## 2020-09-29 DIAGNOSIS — I4892 Unspecified atrial flutter: Secondary | ICD-10-CM | POA: Diagnosis not present

## 2020-09-29 DIAGNOSIS — Z881 Allergy status to other antibiotic agents status: Secondary | ICD-10-CM | POA: Insufficient documentation

## 2020-09-29 DIAGNOSIS — Z888 Allergy status to other drugs, medicaments and biological substances status: Secondary | ICD-10-CM | POA: Diagnosis not present

## 2020-09-29 DIAGNOSIS — Z87891 Personal history of nicotine dependence: Secondary | ICD-10-CM | POA: Insufficient documentation

## 2020-09-29 DIAGNOSIS — Z880 Allergy status to penicillin: Secondary | ICD-10-CM | POA: Insufficient documentation

## 2020-09-29 DIAGNOSIS — Z8249 Family history of ischemic heart disease and other diseases of the circulatory system: Secondary | ICD-10-CM | POA: Insufficient documentation

## 2020-09-29 DIAGNOSIS — D6869 Other thrombophilia: Secondary | ICD-10-CM

## 2020-09-29 DIAGNOSIS — Z7901 Long term (current) use of anticoagulants: Secondary | ICD-10-CM | POA: Diagnosis not present

## 2020-09-29 DIAGNOSIS — Z79899 Other long term (current) drug therapy: Secondary | ICD-10-CM | POA: Diagnosis not present

## 2020-09-29 NOTE — Progress Notes (Signed)
Primary Care Physician: [name], MD Referring Physician: Dr. Vanessa Barbara is a 59 y.o. male with a h/o paroxysmal afib and flutter, s/p ablation x 2, last one in April 2021, on amiodarone , that is in the afib clinic, for f/u. He is in an atypical atrial flutter today. He does not feel any different in rhythm than out of rhythm.  He shows me his Lourena Simmonds which shows intermittent SR. He continues on eliquis for a CHA2DS2VASc score of 1. His labs TSH, cmet were normal checked last in June by PCP, reviewed on pt's phone.   Today, he denies symptoms of palpitations, chest pain, shortness of breath, orthopnea, PND, lower extremity edema, dizziness, presyncope, syncope, or neurologic sequela. The patient is tolerating medications without difficulties and is otherwise without complaint today.   Past Medical History:  Diagnosis Date  . Arthritis   . Atrial flutter (HCC)    diagnosed 2005  . Depression   . Diverticular disease   . GERD (gastroesophageal reflux disease)   . Obesity   . Paroxysmal atrial fibrillation (HCC)   . Primary localized osteoarthritis of left knee    Past Surgical History:  Procedure Laterality Date  . ATRIAL FIBRILLATION ABLATION N/A 01/24/2019   Procedure: ATRIAL FIBRILLATION ABLATION;  Surgeon: Hillis Range, MD;  [facility];  Service: Cardiovascular;  Laterality: N/A;  . ATRIAL FIBRILLATION ABLATION N/A 04/14/2020   Procedure: ATRIAL FIBRILLATION ABLATION;  Surgeon: Hillis Range, MD;  [facility];  Service: Cardiovascular;  Laterality: N/A;  . CARDIOVERSION N/A 12/06/2018   Procedure: CARDIOVERSION;  Surgeon: Parke Poisson, MD;  Location: [facility] ENDOSCOPY;  Service: Cardiovascular;  Laterality: N/A;  . CARDIOVERSION N/A 03/13/2019   Procedure: CARDIOVERSION;  Surgeon: Wendall Stade, MD;  Location: [facility] ENDOSCOPY;  Service: Cardiovascular;  Laterality: N/A;  . CARDIOVERSION N/A 03/05/2020   Procedure:  CARDIOVERSION;  Surgeon: Sande Rives, MD;  Location: [facility] ENDOSCOPY;  Service: Cardiovascular;  Laterality: N/A;  . COLON SURGERY  12/07, 5/08, 10/08  . COLONOSCOPY    . COLOSTOMY  2007  . COLOSTOMY REVERSAL    . EYE SURGERY Bilateral    cataracts  . INCISIONAL HERNIA REPAIR  2008   with mesh  . KNEE ARTHROSCOPY Left 5/10   x2  . MULTIPLE TOOTH EXTRACTIONS    . neck fusion  5/11  . REPLANTATION THUMB Right   . TONSILLECTOMY    . TOTAL KNEE ARTHROPLASTY Left 08/23/2017  . TOTAL KNEE ARTHROPLASTY Left 08/23/2017   Procedure: LEFT TOTAL KNEE ARTHROPLASTY;  Surgeon: Salvatore Marvel, MD;  Location: [facility] OR;  Service: Orthopedics;  Laterality: Left;  . WISDOM TOOTH EXTRACTION      Current Outpatient Medications  Medication Sig Dispense Refill  . acetaminophen (TYLENOL) 650 MG CR tablet Take 650-1,300 mg by mouth every 8 (eight) hours as needed (pain.).     Marland Kitchen atenolol (TENORMIN) 50 MG tablet TAKE 1 TABLET BY MOUTH TWICE DAILY. (BETA BLOCKER) 60 tablet 10  . diclofenac (VOLTAREN) 50 MG EC tablet Take 50 mg by mouth daily.     Marland Kitchen diltiazem (CARDIZEM) 120 MG tablet Take 0.5 tablets (60 mg total) by mouth 3 (three) times daily. 90 tablet 3  . ELIQUIS 5 MG TABS tablet Take 1 tablet by mouth twice daily 60 tablet 5  . gabapentin (NEURONTIN) 100 MG capsule Take 100 mg by mouth daily as needed (nerves).     . methocarbamol (ROBAXIN) 500 MG tablet Take  500 mg by mouth every 8 (eight) hours as needed for muscle spasms.     Marland Kitchen oxyCODONE-acetaminophen (PERCOCET/ROXICET) 5-325 MG tablet Take 1 tablet by mouth 2 (two) times daily.    Marland Kitchen PACERONE 200 MG tablet Take 1 tablet by mouth once daily 90 tablet 3  . sildenafil (REVATIO) 20 MG tablet SMARTSIG:0-5 Tablet(s) By Mouth As Directed    . traMADol (ULTRAM) 50 MG tablet Take 100 mg by mouth every 6 (six) hours.    . Turmeric (QC TUMERIC COMPLEX) 500 MG CAPS Take 500 mg by mouth in the morning and at bedtime.     No current facility-administered  medications for this encounter.    Allergies  Allergen Reactions  . Erythromycin Other (See Comments)    Abdominal cramps   . Hydrocodone-Acetaminophen Other (See Comments)    Makes him feel "high"  . Mobic [Meloxicam] Other (See Comments)    Lower back and kidney pain   . Prednisone Hives, Anxiety and Other (See Comments)    Makes him anxious  . Pregabalin Other (See Comments)    Dry mouth and out-of-body feeling  . Z-Pak [Azithromycin] Nausea Only and Other (See Comments)    Severe stomach cramps   . Lopressor [Metoprolol Tartrate] Swelling  . Penicillins Rash and Other (See Comments)    Has patient had a PCN reaction causing immediate rash, facial/tongue/throat swelling, SOB or lightheadedness with hypotension: No Has patient had a PCN reaction causing severe rash involving mucus membranes or skin necrosis: No Has patient had a PCN reaction that required hospitalization: No Has patient had a PCN reaction occurring within the last 10 years: No If all of the above answers are "NO", then may proceed with Cephalosporin use.      Social History   Socioeconomic History  . Marital status: Married    Spouse name: Not on file  . Number of children: Not on file  . Years of education: Not on file  . Highest education level: Not on file  Occupational History  . Not on file  Tobacco Use  . Smoking status: Former Smoker    Years: 2.00    Types: Cigarettes    Quit date: 08/11/2012    Years since quitting: 8.1  . Smokeless tobacco: Never Used  Vaping Use  . Vaping Use: Never used  Substance and Sexual Activity  . Alcohol use: Yes    Alcohol/week: 14.0 standard drinks    Types: 14 Cans of beer per week    Comment: 2 beers/ day  . Drug use: No  . Sexual activity: Not on file    Comment: married ([name])  Other Topics Concern  . Not on file  Social History Narrative  . Not on file   Social Determinants of Health   Financial Resource Strain:   . Difficulty of Paying  Living Expenses: Not on file  Food Insecurity:   . Worried About Programme researcher, broadcasting/film/video in the Last Year: Not on file  . Ran Out of Food in the Last Year: Not on file  Transportation Needs:   . Lack of Transportation (Medical): Not on file  . Lack of Transportation (Non-Medical): Not on file  Physical Activity:   . Days of Exercise per Week: Not on file  . Minutes of Exercise per Session: Not on file  Stress:   . Feeling of Stress : Not on file  Social Connections:   . Frequency of Communication with Friends and Family: Not on file  .  Frequency of Social Gatherings with Friends and Family: Not on file  . Attends Religious Services: Not on file  . Active Member of Clubs or Organizations: Not on file  . Attends Banker Meetings: Not on file  . Marital Status: Not on file  Intimate Partner Violence:   . Fear of Current or Ex-Partner: Not on file  . Emotionally Abused: Not on file  . Physically Abused: Not on file  . Sexually Abused: Not on file    Family History  Problem Relation Age of Onset  . Hypertension Mother   . CAD Father   . Hypertension Father   . Diabetes Father   . Colon polyps Father   . CAD Sister   . Cancer Paternal Grandfather        brain and lung    ROS- All systems are reviewed and negative except as per the HPI above  Physical Exam: Vitals:   09/29/20 0844  BP: 122/70  Pulse: (!) 56  Weight: 116.8 kg  Height: 5\' 9"  (1.753 m)   Wt Readings from Last 3 Encounters:  09/29/20 116.8 kg  08/21/20 117.5 kg  05/12/20 119.5 kg    Labs: Lab Results  Component Value Date   NA 139 04/01/2020   K 4.5 04/01/2020   CL 107 (H) 04/01/2020   CO2 25 04/01/2020   GLUCOSE 106 (H) 04/01/2020   BUN 26 (H) 04/01/2020   CREATININE 0.99 04/01/2020   CALCIUM 8.9 04/01/2020   PHOS 3.5 11/01/2007   MG 2.0 12/07/2018   Lab Results  Component Value Date   INR 0.95 11/18/2014   Lab Results  Component Value Date   CHOL  10/29/2007    100         ATP III CLASSIFICATION:  <200     mg/dL   Desirable  13/02/2007  mg/dL   Borderline High  702-637    mg/dL   High   TRIG >=858 850     GEN- The patient is well appearing, alert and oriented x 3 today.   Head- normocephalic, atraumatic Eyes-  Sclera clear, conjunctiva pink Ears- hearing intact Oropharynx- clear Neck- supple, no JVP Lymph- no cervical lymphadenopathy Lungs- Clear to ausculation bilaterally, normal work of breathing Heart- Regular rate and rhythm, no murmurs, rubs or gallops, PMI not laterally displaced GI- soft, NT, ND, + BS Extremities- no clubbing, cyanosis, or edema MS- no significant deformity or atrophy Skin- no rash or lesion Psych- euthymic mood, full affect Neuro- strength and sensation are intact  EKG-atrial  flutter at 56 bpm, qrs int 90 ms, qtc 403 ms  kardia strips reviewed ans there is intermittent NSR, flutter and fib noted    Assessment and Plan: 1.Paroxysmal  Atrial fib/flutter  S/p ablation 12/2018 and 03/2019 Continue amiodarone 200 mg qd  Continue amiodarone  200 mg daily,  diltiazem 60 mg tid and  atenolol 50 mg bid  TSH/ liver enzymes checked in June by PCP and WNL  2. CHA2DS2VASc score of 1 (LV dysfunction) Continue eliquis 5 mg bid   F/u with Dr. July in November as scheduled   December. [facility] [address] [place], [place] [phone]

## 2020-10-04 ENCOUNTER — Other Ambulatory Visit: Payer: Self-pay | Admitting: Internal Medicine

## 2020-10-05 ENCOUNTER — Other Ambulatory Visit: Payer: Self-pay

## 2020-10-26 ENCOUNTER — Other Ambulatory Visit: Payer: Self-pay | Admitting: Internal Medicine

## 2020-10-26 NOTE — Telephone Encounter (Signed)
Eliquis 5mg  refill request received. Patient is 59 years old, weight-116.8kg, Crea-0.77 on 04/01/2020, Diagnosis-Afib/Aflutter, and last seen by 06/01/2020 on 09/29/2020. Dose is appropriate based on dosing criteria. Will send in refill to requested pharmacy.

## 2020-11-16 ENCOUNTER — Other Ambulatory Visit: Payer: Self-pay

## 2020-11-16 ENCOUNTER — Ambulatory Visit (INDEPENDENT_AMBULATORY_CARE_PROVIDER_SITE_OTHER): Payer: 59 | Admitting: Internal Medicine

## 2020-11-16 ENCOUNTER — Encounter: Payer: Self-pay | Admitting: *Deleted

## 2020-11-16 VITALS — BP 112/68 | HR 88 | Ht 69.0 in | Wt 256.2 lb

## 2020-11-16 DIAGNOSIS — D6869 Other thrombophilia: Secondary | ICD-10-CM

## 2020-11-16 DIAGNOSIS — I4819 Other persistent atrial fibrillation: Secondary | ICD-10-CM | POA: Diagnosis not present

## 2020-11-16 DIAGNOSIS — I4891 Unspecified atrial fibrillation: Secondary | ICD-10-CM

## 2020-11-16 NOTE — Patient Instructions (Addendum)
Medication Instructions:  Your physician recommends that you continue on your current medications as directed. Please refer to the Current Medication list given to you today.  *If you need a refill on your cardiac medications before your next appointment, please call your pharmacy*  Lab Work: None ordered.  If you have labs (blood work) drawn today and your tests are completely normal, you will receive your results only by: Marland Kitchen MyChart Message (if you have MyChart) OR . A paper copy in the mail If you have any lab test that is abnormal or we need to change your treatment, we will call you to review the results.  Testing/Procedures: Your physician has recommended that you wear a heart monitor. Heart monitors are medical devices that record the heart's electrical activity. Doctors most often use these monitors to diagnose arrhythmias. Arrhythmias are problems with the speed or rhythm of the heartbeat. The monitor is a small, portable device. You can wear one while you do your normal daily activities. This is usually used to diagnose what is causing palpitations/syncope (passing out).   Follow-Up: At [facility], you and your health needs are our priority.  As part of our continuing mission to provide you with exceptional heart care, we have created designated Provider Care Teams.  These Care Teams include your primary Cardiologist (physician) and Advanced Practice Providers (APPs -  Physician Assistants and Nurse Practitioners) who all work together to provide you with the care you need, when you need it.  We recommend signing up for the patient portal called "MyChart".  Sign up information is provided on this After Visit Summary.  MyChart is used to connect with patients for Virtual Visits (Telemedicine).  Patients are able to view lab/test results, encounter notes, upcoming appointments, etc.  Non-urgent messages can be sent to your provider as well.   To learn more about what you can do with  MyChart, go to [email or web address].    Your next appointment:   Your physician wants you to follow-up in:  01/18/21 at 9:45 am with Dr. [name].    Other Instructions: ZIO XT- Long Term Monitor Instructions   Your physician has requested you wear your ZIO patch monitor__7__days.   This is a single patch monitor.  [employer] supplies one patch monitor per enrollment.  Additional stickers are not available.   Please do not apply patch if you will be having a Nuclear Stress Test, Echocardiogram, Cardiac CT, MRI, or Chest Xray during the time frame you would be wearing the monitor. The patch cannot be worn during these tests.  You cannot remove and re-apply the ZIO XT patch monitor.   Your ZIO patch monitor will be sent USPS Priority mail from [facility] directly to your home address. The monitor may also be mailed to a PO BOX if home delivery is not available.   It may take 3-5 days to receive your monitor after you have been enrolled.   Once you have received you monitor, please review enclosed instructions.  Your monitor has already been registered assigning a specific monitor serial # to you.   Applying the monitor   Shave hair from upper left chest.   Hold abrader disc by orange tab.  Rub abrader in 40 strokes over left upper chest as indicated in your monitor instructions.   Clean area with 4 enclosed alcohol pads .  Use all pads to assure are is cleaned thoroughly.  Let dry.   Apply patch as indicated in monitor instructions.  Patch will  be place under collarbone on left side of chest with arrow pointing upward.   Rub patch adhesive wings for 2 minutes.Remove white label marked "1".  Remove white label marked "2".  Rub patch adhesive wings for 2 additional minutes.   While looking in a mirror, press and release button in center of patch.  A small green light will flash 3-4 times .  This will be your only indicator the monitor has been turned on.     Do not shower for the  first 24 hours.  You may shower after the first 24 hours.   Press button if you feel a symptom. You will hear a small click.  Record Date, Time and Symptom in the Patient Log Book.   When you are ready to remove patch, follow instructions on last 2 pages of Patient Log Book.  Stick patch monitor onto last page of Patient Log Book.   Place Patient Log Book in [place] box.  Use locking tab on box and tape box closed securely.  The [employer] and [employer] has [employer] on it.  Please place in mailbox as soon as possible.  Your physician should have your test results approximately 7 days after the monitor has been mailed back to [facility].   Call [place] at [phone] if you have questions regarding your ZIO XT patch monitor.  Call them immediately if you see an orange light blinking on your monitor.   If your monitor falls off in less than 4 days contact our Monitor department at [phone].  If your monitor becomes loose or falls off after 4 days call [employer] at [phone] for suggestions on securing your monitor.

## 2020-11-16 NOTE — Progress Notes (Signed)
Patient ID: [name], male   DOB: 1961-04-26, 59 y.o.   MRN: [id number] Patient enrolled for [employer] to ship a 7 day ZIO XT long term holter monitor to his home.

## 2020-11-16 NOTE — Progress Notes (Signed)
PCP: [name], MD   Primary EP: Dr Vanessa Barbara is a 59 y.o. male who presents today for routine electrophysiology followup.  Since last being seen in our clinic, the patient reports doing very well.  He is in atypical atrial flutter but unaware.  Today, he denies symptoms of palpitations, chest pain, shortness of breath,  lower extremity edema, dizziness, presyncope, or syncope.  The patient is otherwise without complaint today.   Past Medical History:  Diagnosis Date  . Arthritis   . Atrial flutter (HCC)    diagnosed 2005  . Depression   . Diverticular disease   . GERD (gastroesophageal reflux disease)   . Obesity   . Paroxysmal atrial fibrillation (HCC)   . Primary localized osteoarthritis of left knee    Past Surgical History:  Procedure Laterality Date  . ATRIAL FIBRILLATION ABLATION N/A 01/24/2019   Procedure: ATRIAL FIBRILLATION ABLATION;  Surgeon: Hillis Range, MD;  [facility];  Service: Cardiovascular;  Laterality: N/A;  . ATRIAL FIBRILLATION ABLATION N/A 04/14/2020   Procedure: ATRIAL FIBRILLATION ABLATION;  Surgeon: Hillis Range, MD;  Location: [facility];  Service: Cardiovascular;  Laterality: N/A;  . CARDIOVERSION N/A 12/06/2018   Procedure: CARDIOVERSION;  Surgeon: Parke Poisson, MD;  Location: [facility] ENDOSCOPY;  Service: Cardiovascular;  Laterality: N/A;  . CARDIOVERSION N/A 03/13/2019   Procedure: CARDIOVERSION;  Surgeon: Wendall Stade, MD;  Location: [facility] ENDOSCOPY;  Service: Cardiovascular;  Laterality: N/A;  . CARDIOVERSION N/A 03/05/2020   Procedure: CARDIOVERSION;  Surgeon: Sande Rives, MD;  Location: [facility] ENDOSCOPY;  Service: Cardiovascular;  Laterality: N/A;  . COLON SURGERY  12/07, 5/08, 10/08  . COLONOSCOPY    . COLOSTOMY  2007  . COLOSTOMY REVERSAL    . EYE SURGERY Bilateral    cataracts  . INCISIONAL HERNIA REPAIR  2008   with mesh  . KNEE ARTHROSCOPY Left 5/10   x2  . MULTIPLE TOOTH EXTRACTIONS     . neck fusion  5/11  . REPLANTATION THUMB Right   . TONSILLECTOMY    . TOTAL KNEE ARTHROPLASTY Left 08/23/2017  . TOTAL KNEE ARTHROPLASTY Left 08/23/2017   Procedure: LEFT TOTAL KNEE ARTHROPLASTY;  Surgeon: Salvatore Marvel, MD;  Location: [facility] OR;  Service: Orthopedics;  Laterality: Left;  . WISDOM TOOTH EXTRACTION      ROS- all systems are reviewed and negatives except as per HPI above  Current Outpatient Medications  Medication Sig Dispense Refill  . atenolol (TENORMIN) 50 MG tablet Take 1 tablet by mouth twice daily 60 tablet 9  . diclofenac (VOLTAREN) 50 MG EC tablet Take 50 mg by mouth daily.     Marland Kitchen diltiazem (CARDIZEM) 120 MG tablet Take 0.5 tablets (60 mg total) by mouth 3 (three) times daily. 90 tablet 3  . ELIQUIS 5 MG TABS tablet Take 1 tablet by mouth twice daily 180 tablet 1  . gabapentin (NEURONTIN) 100 MG capsule Take 100 mg by mouth daily as needed (nerves).     . methocarbamol (ROBAXIN) 500 MG tablet Take 500 mg by mouth every 8 (eight) hours as needed for muscle spasms.     Marland Kitchen oxyCODONE-acetaminophen (PERCOCET/ROXICET) 5-325 MG tablet Take 1 tablet by mouth 2 (two) times daily.    Marland Kitchen PACERONE 200 MG tablet Take 1 tablet by mouth once daily 90 tablet 3  . sildenafil (REVATIO) 20 MG tablet SMARTSIG:0-5 Tablet(s) By Mouth As Directed    . traMADol (ULTRAM) 50 MG tablet Take 100 mg  by mouth every 6 (six) hours.    . Turmeric (QC TUMERIC COMPLEX) 500 MG CAPS Take 500 mg by mouth in the morning and at bedtime.     No current facility-administered medications for this visit.    Physical Exam: Vitals:   11/16/20 1021  BP: 112/68  Pulse: 88  SpO2: 99%  Weight: 256 lb 3.2 oz (116.2 kg)  Height: 5\' 9"  (1.753 m)    GEN- The patient is well appearing, alert and oriented x 3 today.   Head- normocephalic, atraumatic Eyes-  Sclera clear, conjunctiva pink Ears- hearing intact Oropharynx- clear Lungs- Clear to ausculation bilaterally, normal work of breathing Heart- irregular  rate and rhythm, no murmurs, rubs or gallops, PMI not laterally displaced GI- soft, NT, ND, + BS Extremities- no clubbing, cyanosis, or edema  Wt Readings from Last 3 Encounters:  11/16/20 256 lb 3.2 oz (116.2 kg)  09/29/20 257 lb 9.6 oz (116.8 kg)  08/21/20 259 lb (117.5 kg)    EKG tracing ordered today is personally reviewed and shows atypical atrial flutter, V rate 80s  Assessment and Plan:  1. Persistent afib/ atypical atrial flutter Doing well post ablation, though he has had further arrhythmias.  He is on amiodarone but continues to have atypical atrial flutter. He is on eliquis I suspect that his atypical atrial flutter is very persistent.  He has a 08/23/20 which frequently tells him sinus rhythm, though on my review, these appear to be primarily atypical atrial flutter with regular RR intervals. I will place 7 day Zio monitor to further evaluate.  If he is persistently out of rhythm, our options, are 1. Stop amiodarone and continue long term rate control, 2. MAZE vs convergent procedure, 3. Repeat ablation  2. Tachycardia mediated CM Resolved Repeat echo in 2022.  3. Obesity Body mass index is 37.83 kg/m. Lifestyle modification is advised  Risks, benefits and potential toxicities for medications prescribed and/or refilled reviewed with patient today.   Return to see me after his Zio monitor for further discussions He requires close follow-up on amiodarone to avoid toxicity  2023 MD, [facility] 11/16/2020 10:42 AM

## 2020-11-20 ENCOUNTER — Other Ambulatory Visit (INDEPENDENT_AMBULATORY_CARE_PROVIDER_SITE_OTHER): Payer: 59

## 2020-11-20 DIAGNOSIS — I4819 Other persistent atrial fibrillation: Secondary | ICD-10-CM | POA: Diagnosis not present

## 2020-11-20 DIAGNOSIS — D6869 Other thrombophilia: Secondary | ICD-10-CM

## 2020-11-20 DIAGNOSIS — I4891 Unspecified atrial fibrillation: Secondary | ICD-10-CM

## 2020-12-23 ENCOUNTER — Telehealth: Payer: Self-pay | Admitting: Internal Medicine

## 2020-12-23 NOTE — Telephone Encounter (Signed)
The patient has been notified of the result and verbalized understanding.  All questions (if any) were answered. Sampson Goon, RN 12/23/2020 1:56 PM

## 2020-12-23 NOTE — Telephone Encounter (Signed)
Patient returning call for monitor results. 

## 2020-12-23 NOTE — Telephone Encounter (Signed)
-----   Message from Hillis Range, MD sent at 12/23/2020 12:07 AM EST ----- Results reviewed.  Inetta Fermo, please inform pt of result. I will route to primary care also.

## 2021-01-18 ENCOUNTER — Other Ambulatory Visit: Payer: Self-pay

## 2021-01-18 ENCOUNTER — Encounter: Payer: Self-pay | Admitting: Internal Medicine

## 2021-01-18 ENCOUNTER — Ambulatory Visit (INDEPENDENT_AMBULATORY_CARE_PROVIDER_SITE_OTHER): Payer: 59 | Admitting: Internal Medicine

## 2021-01-18 VITALS — BP 128/62 | HR 52 | Ht 69.0 in | Wt 252.6 lb

## 2021-01-18 DIAGNOSIS — I4819 Other persistent atrial fibrillation: Secondary | ICD-10-CM

## 2021-01-18 DIAGNOSIS — I4891 Unspecified atrial fibrillation: Secondary | ICD-10-CM

## 2021-01-18 DIAGNOSIS — D6869 Other thrombophilia: Secondary | ICD-10-CM

## 2021-01-18 MED ORDER — DILTIAZEM HCL 120 MG PO TABS: 120.0000 mg | ORAL_TABLET | Freq: Three times a day (TID) | ORAL | 3 refills | Status: DC

## 2021-01-18 NOTE — Progress Notes (Signed)
PCP: [name], MD   Primary EP: Dr Vanessa Barbara is a 60 y.o. male who presents today for routine electrophysiology followup.  Since last being seen in our clinic, the patient reports doing very well.  He has persistent atypical atrial flutter but is mostly unaware. Today, he denies symptoms of palpitations, chest pain, shortness of breath,  lower extremity edema, dizziness, presyncope, or syncope.  The patient is otherwise without complaint today.   Past Medical History:  Diagnosis Date  . Arthritis   . Atrial flutter (HCC)    diagnosed 2005  . Depression   . Diverticular disease   . GERD (gastroesophageal reflux disease)   . Obesity   . Paroxysmal atrial fibrillation (HCC)   . Primary localized osteoarthritis of left knee    Past Surgical History:  Procedure Laterality Date  . ATRIAL FIBRILLATION ABLATION N/A 01/24/2019   Procedure: ATRIAL FIBRILLATION ABLATION;  Surgeon: Hillis Range, MD;  [facility];  Service: Cardiovascular;  Laterality: N/A;  . ATRIAL FIBRILLATION ABLATION N/A 04/14/2020   Procedure: ATRIAL FIBRILLATION ABLATION;  Surgeon: Hillis Range, MD;  [facility];  Service: Cardiovascular;  Laterality: N/A;  . CARDIOVERSION N/A 12/06/2018   Procedure: CARDIOVERSION;  Surgeon: Parke Poisson, MD;  Location: [facility] ENDOSCOPY;  Service: Cardiovascular;  Laterality: N/A;  . CARDIOVERSION N/A 03/13/2019   Procedure: CARDIOVERSION;  Surgeon: Wendall Stade, MD;  Location: [facility] ENDOSCOPY;  Service: Cardiovascular;  Laterality: N/A;  . CARDIOVERSION N/A 03/05/2020   Procedure: CARDIOVERSION;  Surgeon: Sande Rives, MD;  Location: [facility] ENDOSCOPY;  Service: Cardiovascular;  Laterality: N/A;  . COLON SURGERY  12/07, 5/08, 10/08  . COLONOSCOPY    . COLOSTOMY  2007  . COLOSTOMY REVERSAL    . EYE SURGERY Bilateral    cataracts  . INCISIONAL HERNIA REPAIR  2008   with mesh  . KNEE ARTHROSCOPY Left 5/10   x2  . MULTIPLE  TOOTH EXTRACTIONS    . neck fusion  5/11  . REPLANTATION THUMB Right   . TONSILLECTOMY    . TOTAL KNEE ARTHROPLASTY Left 08/23/2017  . TOTAL KNEE ARTHROPLASTY Left 08/23/2017   Procedure: LEFT TOTAL KNEE ARTHROPLASTY;  Surgeon: Salvatore Marvel, MD;  Location: [facility] OR;  Service: Orthopedics;  Laterality: Left;  . WISDOM TOOTH EXTRACTION      ROS- all systems are reviewed and negatives except as per HPI above  Current Outpatient Medications  Medication Sig Dispense Refill  . atenolol (TENORMIN) 50 MG tablet Take 1 tablet by mouth twice daily 60 tablet 9  . diclofenac (VOLTAREN) 50 MG EC tablet Take 50 mg by mouth daily.     Marland Kitchen diltiazem (CARDIZEM) 120 MG tablet Take 0.5 tablets (60 mg total) by mouth 3 (three) times daily. 90 tablet 3  . ELIQUIS 5 MG TABS tablet Take 1 tablet by mouth twice daily 180 tablet 1  . gabapentin (NEURONTIN) 100 MG capsule Take 100 mg by mouth daily as needed (nerves).     . methocarbamol (ROBAXIN) 500 MG tablet Take 500 mg by mouth every 8 (eight) hours as needed for muscle spasms.     Marland Kitchen oxyCODONE-acetaminophen (PERCOCET/ROXICET) 5-325 MG tablet Take 1 tablet by mouth 2 (two) times daily.    Marland Kitchen PACERONE 200 MG tablet Take 1 tablet by mouth once daily 90 tablet 3  . sildenafil (REVATIO) 20 MG tablet SMARTSIG:0-5 Tablet(s) By Mouth As Directed    . traMADol (ULTRAM) 50 MG tablet Take 100  mg by mouth every 6 (six) hours.    . Turmeric 500 MG CAPS Take 500 mg by mouth in the morning and at bedtime.     No current facility-administered medications for this visit.    Physical Exam: Vitals:   01/18/21 0944  BP: 128/62  Pulse: (!) 52  SpO2: 96%  Weight: 252 lb 9.6 oz (114.6 kg)  Height: 5\' 9"  (1.753 m)    GEN- The patient is well appearing, alert and oriented x 3 today.   Head- normocephalic, atraumatic Eyes-  Sclera clear, conjunctiva pink Ears- hearing intact Oropharynx- clear Lungs- Clear to ausculation bilaterally, normal work of breathing Heart- irregular  rate and rhythm  GI- soft, NT, ND, + BS Extremities- no clubbing, cyanosis, or edema  Wt Readings from Last 3 Encounters:  01/18/21 252 lb 9.6 oz (114.6 kg)  11/16/20 256 lb 3.2 oz (116.2 kg)  09/29/20 257 lb 9.6 oz (116.8 kg)    EKG tracing ordered today is personally reviewed and shows atypical atrial flutter, V rate 52 bpm  Assessment and Plan:  1. Persistent afib/ atypical atrial flutter Doing well but with ongoing atrial arrhythmias for which he is mostly asymptomatic. S/p prior mitral annular ablation as well at [facility] x 2 Recent Zio is reviewed and shows atypical atrial flutter burden of 100%, average heart rate was 98 bpm (range 47-120 bpm)  At this point, we discussed options of long term rate control vs repeat ablation vs MAZE.   He prefers rate control going forward.  He has already discontinued amiodarone  2. Tachycardia mediated CM I think it is important to keep his rates controlled as his EF did recover with sinus previously Repeat echo  3. Obesity Body mass index is 37.3 kg/m. Lifestyle modification advised  Risks, benefits and potential toxicities for medications prescribed and/or refilled reviewed with patient today.   AV clinic in 3 months I will see in 6 months  11/29/20 MD, [facility] 01/18/2021 10:08 AM

## 2021-01-18 NOTE — Patient Instructions (Signed)
Medication Instructions:  Continue current medications  *If you need a refill on your cardiac medications before your next appointment, please call your pharmacy*   Lab Work: none If you have labs (blood work) drawn today and your tests are completely normal, you will receive your results only by: Marland Kitchen MyChart Message (if you have MyChart) OR . A paper copy in the mail If you have any lab test that is abnormal or we need to change your treatment, we will call you to review the results.   Testing/Procedures: please schedule echo Your physician has requested that you have an echocardiogram. Echocardiography is a painless test that uses sound waves to create images of your heart. It provides your doctor with information about the size and shape of your heart and how well your heart's chambers and valves are working. This procedure takes approximately one hour. There are no restrictions for this procedure.    Follow-Up: At [facility], you and your health needs are our priority.  As part of our continuing mission to provide you with exceptional heart care, we have created designated Provider Care Teams.  These Care Teams include your primary Cardiologist (physician) and Advanced Practice Providers (APPs -  Physician Assistants and Nurse Practitioners) who all work together to provide you with the care you need, when you need it.  We recommend signing up for the patient portal called "MyChart".  Sign up information is provided on this After Visit Summary.  MyChart is used to connect with patients for Virtual Visits (Telemedicine).  Patients are able to view lab/test results, encounter notes, upcoming appointments, etc.  Non-urgent messages can be sent to your provider as well.   To learn more about what you can do with MyChart, go to [email or web address].    Your next appointment:   3 months with the Afib Clinic, they will call you to schedule.  07/19/21 at 9:45 am with Dr. [name]

## 2021-02-04 ENCOUNTER — Other Ambulatory Visit: Payer: Self-pay

## 2021-02-04 ENCOUNTER — Ambulatory Visit (HOSPITAL_COMMUNITY): Payer: 59 | Attending: Cardiology

## 2021-02-04 DIAGNOSIS — I4891 Unspecified atrial fibrillation: Secondary | ICD-10-CM

## 2021-02-04 DIAGNOSIS — D6869 Other thrombophilia: Secondary | ICD-10-CM

## 2021-02-04 DIAGNOSIS — I4819 Other persistent atrial fibrillation: Secondary | ICD-10-CM

## 2021-02-04 LAB — ECHOCARDIOGRAM COMPLETE: S' Lateral: 3.9 cm

## 2021-02-04 MED ORDER — PERFLUTREN LIPID MICROSPHERE
1.0000 mL | INTRAVENOUS | Status: AC | PRN
  Administered 2021-02-04: 1 mL via INTRAVENOUS

## 2021-05-08 ENCOUNTER — Other Ambulatory Visit: Payer: Self-pay | Admitting: Nurse Practitioner

## 2021-05-27 ENCOUNTER — Other Ambulatory Visit: Payer: Self-pay

## 2021-05-27 ENCOUNTER — Other Ambulatory Visit (HOSPITAL_COMMUNITY): Payer: Self-pay

## 2021-05-27 DIAGNOSIS — I4811 Longstanding persistent atrial fibrillation: Secondary | ICD-10-CM

## 2021-05-27 MED ORDER — ELIQUIS 5 MG PO TABS: ORAL_TABLET | ORAL | 0 refills | Status: DC

## 2021-05-27 MED ORDER — DILTIAZEM HCL 120 MG PO TABS: 120.0000 mg | ORAL_TABLET | Freq: Three times a day (TID) | ORAL | 3 refills | Status: DC

## 2021-05-27 NOTE — Telephone Encounter (Signed)
Prescription refill request for Eliquis received. Indication: a fib Last office visit: 01/18/21 Scr: 1.03 Age: 60 Weight:  114kg

## 2021-06-01 ENCOUNTER — Encounter (HOSPITAL_COMMUNITY): Payer: Self-pay | Admitting: Nurse Practitioner

## 2021-06-01 ENCOUNTER — Ambulatory Visit (HOSPITAL_COMMUNITY)
Admission: RE | Admit: 2021-06-01 | Discharge: 2021-06-01 | Disposition: A | Discharge status: Home / Self Care | Payer: 59 | Source: Ambulatory Visit | Attending: Nurse Practitioner | Admitting: Nurse Practitioner

## 2021-06-01 ENCOUNTER — Other Ambulatory Visit: Payer: Self-pay

## 2021-06-01 VITALS — BP 120/74 | HR 64 | Ht 69.0 in | Wt 250.6 lb

## 2021-06-01 DIAGNOSIS — G8929 Other chronic pain: Secondary | ICD-10-CM | POA: Insufficient documentation

## 2021-06-01 DIAGNOSIS — I10 Essential (primary) hypertension: Secondary | ICD-10-CM | POA: Diagnosis not present

## 2021-06-01 DIAGNOSIS — D6869 Other thrombophilia: Secondary | ICD-10-CM | POA: Diagnosis not present

## 2021-06-01 DIAGNOSIS — Z96652 Presence of left artificial knee joint: Secondary | ICD-10-CM | POA: Diagnosis not present

## 2021-06-01 DIAGNOSIS — Z7901 Long term (current) use of anticoagulants: Secondary | ICD-10-CM | POA: Insufficient documentation

## 2021-06-01 DIAGNOSIS — M549 Dorsalgia, unspecified: Secondary | ICD-10-CM | POA: Diagnosis not present

## 2021-06-01 DIAGNOSIS — Z87891 Personal history of nicotine dependence: Secondary | ICD-10-CM | POA: Insufficient documentation

## 2021-06-01 DIAGNOSIS — I4819 Other persistent atrial fibrillation: Secondary | ICD-10-CM | POA: Diagnosis present

## 2021-06-01 DIAGNOSIS — Z79891 Long term (current) use of opiate analgesic: Secondary | ICD-10-CM | POA: Diagnosis not present

## 2021-06-01 DIAGNOSIS — Z79899 Other long term (current) drug therapy: Secondary | ICD-10-CM | POA: Insufficient documentation

## 2021-06-01 DIAGNOSIS — I484 Atypical atrial flutter: Secondary | ICD-10-CM

## 2021-06-01 DIAGNOSIS — I501 Left ventricular failure: Secondary | ICD-10-CM | POA: Diagnosis not present

## 2021-06-01 MED ORDER — DILTIAZEM HCL ER COATED BEADS 180 MG PO CP24: 180.0000 mg | ORAL_CAPSULE | Freq: Every day | ORAL | 3 refills | Status: DC

## 2021-06-01 NOTE — Progress Notes (Signed)
Primary Care Physician: [name], MD Referring Physician: Dr. Vanessa Barbara is a 60 y.o. male with a h/o paroxysmal afib and flutter, s/p ablation x 2, last one in April 2021, on amiodarone , that is in the afib clinic, for f/u. He is in an atypical atrial flutter today. He does not feel any different in rhythm than out of rhythm.  He shows me his Lourena Simmonds which shows intermittent SR. He continues on eliquis for a CHA2DS2VASc score of 1. His labs TSH, cmet were normal checked last in June by PCP, reviewed on pt's phone.   F/u in afib clinic, 06/01/21, he appears to be in persistent  atrial flutter now. He has stopped amiodarone under the recent guidance of Dr. [name], with Lourena Simmonds and Zio patch showing persistent atrial  flutter and he has  decided to live in atrial flutter. He has noted some increase in BP and HR over the last week but this coincides with a cortisone shot for his chronic back pain 10 days ago. He is on max dose of Cardizem but is an intermittent acting  tablet and probably wears off after 8 hours use. We discussed changing over to long acting Cardizem to see if improvement and also how cortisone  can have short term consequences to HR and BP. He will need additional cortisone over time as surgery is not an option with his back issues.   Today, he denies symptoms of palpitations, chest pain, shortness of breath, orthopnea, PND, lower extremity edema, dizziness, presyncope, syncope, or neurologic sequela. The patient is tolerating medications without difficulties and is otherwise without complaint today.   Past Medical History:  Diagnosis Date  . Arthritis   . Atrial flutter (HCC)    diagnosed 2005  . Depression   . Diverticular disease   . GERD (gastroesophageal reflux disease)   . Obesity   . Paroxysmal atrial fibrillation (HCC)   . Primary localized osteoarthritis of left knee    Past Surgical History:  Procedure Laterality Date  . ATRIAL FIBRILLATION  ABLATION N/A 01/24/2019   Procedure: ATRIAL FIBRILLATION ABLATION;  Surgeon: Hillis Range, MD;  [facility]: [facility];  Service: Cardiovascular;  Laterality: N/A;  . ATRIAL FIBRILLATION ABLATION N/A 04/14/2020   Procedure: ATRIAL FIBRILLATION ABLATION;  Surgeon: Hillis Range, MD;  [facility]: [facility];  Service: Cardiovascular;  Laterality: N/A;  . CARDIOVERSION N/A 12/06/2018   Procedure: CARDIOVERSION;  Surgeon: Parke Poisson, MD;  Location: [facility] ENDOSCOPY;  Service: Cardiovascular;  Laterality: N/A;  . CARDIOVERSION N/A 03/13/2019   Procedure: CARDIOVERSION;  Surgeon: Wendall Stade, MD;  Location: [facility] ENDOSCOPY;  Service: Cardiovascular;  Laterality: N/A;  . CARDIOVERSION N/A 03/05/2020   Procedure: CARDIOVERSION;  Surgeon: Sande Rives, MD;  Location: [facility] ENDOSCOPY;  Service: Cardiovascular;  Laterality: N/A;  . COLON SURGERY  12/07, 5/08, 10/08  . COLONOSCOPY    . COLOSTOMY  2007  . COLOSTOMY REVERSAL    . EYE SURGERY Bilateral    cataracts  . INCISIONAL HERNIA REPAIR  2008   with mesh  . KNEE ARTHROSCOPY Left 5/10   x2  . MULTIPLE TOOTH EXTRACTIONS    . neck fusion  5/11  . REPLANTATION THUMB Right   . TONSILLECTOMY    . TOTAL KNEE ARTHROPLASTY Left 08/23/2017  . TOTAL KNEE ARTHROPLASTY Left 08/23/2017   Procedure: LEFT TOTAL KNEE ARTHROPLASTY;  Surgeon: Salvatore Marvel, MD;  Location[facility] OR;  Service: Orthopedics;  Laterality: Left;  .  WISDOM TOOTH EXTRACTION      Current Outpatient Medications  Medication Sig Dispense Refill  . apixaban (ELIQUIS) 5 MG TABS tablet Take 1 tablet by mouth twice a day 60 tablet 0  . atenolol (TENORMIN) 50 MG tablet Take 1 tablet by mouth twice daily 60 tablet 9  . clindamycin (CLEOCIN) 300 MG capsule Take 300 mg by mouth every 6 (six) hours.    . diazepam (VALIUM) 10 MG tablet Take by mouth.    . diclofenac (VOLTAREN) 50 MG EC tablet Take 50 mg by mouth daily.     Marland Kitchen diltiazem (CARDIZEM) 120 MG tablet Take 1 tablet (120  mg total) by mouth 3 (three) times daily. 90 tablet 3  . gabapentin (NEURONTIN) 100 MG capsule Take 100 mg by mouth daily as needed (nerves).     . Ginger 500 MG CAPS Take by mouth.    . methocarbamol (ROBAXIN) 500 MG tablet Take 500 mg by mouth every 8 (eight) hours as needed for muscle spasms.     Marland Kitchen oxyCODONE-acetaminophen (PERCOCET/ROXICET) 5-325 MG tablet Take 1 tablet by mouth daily in the afternoon.    . sildenafil (REVATIO) 20 MG tablet Take 20 mg by mouth as needed.    . traMADol (ULTRAM) 50 MG tablet Take 100 mg by mouth every 6 (six) hours.    . Turmeric 500 MG TABS Take 500 mg by mouth in the morning and at bedtime. With 3000 units of Ginger- combined tablet     No current facility-administered medications for this encounter.    Allergies  Allergen Reactions  . Erythromycin Other (See Comments)    Abdominal cramps   . Hydrocodone-Acetaminophen Other (See Comments)    Makes him feel "high"  . Mobic [Meloxicam] Other (See Comments)    Lower back and kidney pain   . Prednisone Hives, Anxiety and Other (See Comments)    Makes him anxious  . Pregabalin Other (See Comments)    Dry mouth and out-of-body feeling  . Z-Pak [Azithromycin] Nausea Only and Other (See Comments)    Severe stomach cramps   . Lopressor [Metoprolol Tartrate] Swelling  . Penicillins Rash and Other (See Comments)    Has patient had a PCN reaction causing immediate rash, facial/tongue/throat swelling, SOB or lightheadedness with hypotension: No Has patient had a PCN reaction causing severe rash involving mucus membranes or skin necrosis: No Has patient had a PCN reaction that required hospitalization: No Has patient had a PCN reaction occurring within the last 10 years: No If all of the above answers are "NO", then may proceed with Cephalosporin use.      Social History   Socioeconomic History  . Marital status: Married    Spouse name: Not on file  . Number of children: Not on file  . Years of  education: Not on file  . Highest education level: Not on file  Occupational History  . Not on file  Tobacco Use  . Smoking status: Former Smoker    Years: 2.00    Types: Cigarettes    Quit date: 08/11/2012    Years since quitting: 8.8  . Smokeless tobacco: Never Used  Vaping Use  . Vaping Use: Never used  Substance and Sexual Activity  . Alcohol use: Yes    Alcohol/week: 14.0 standard drinks    Types: 14 Cans of beer per week    Comment: 2 beers/ day  . Drug use: No  . Sexual activity: Not on file    Comment: married ([name])  Other Topics Concern  . Not on file  Social History Narrative  . Not on file   Social Determinants of Health   Financial Resource Strain: Not on file  Food Insecurity: Not on file  Transportation Needs: Not on file  Physical Activity: Not on file  Stress: Not on file  Social Connections: Not on file  Intimate Partner Violence: Not on file    Family History  Problem Relation Age of Onset  . Hypertension Mother   . CAD Father   . Hypertension Father   . Diabetes Father   . Colon polyps Father   . CAD Sister   . Cancer Paternal Grandfather        brain and lung    ROS- All systems are reviewed and negative except as per the HPI above  Physical Exam: Vitals:   06/01/21 0838  Weight: 113.7 kg  Height: 5\' 9"  (1.753 m)   Wt Readings from Last 3 Encounters:  06/01/21 113.7 kg  01/18/21 114.6 kg  11/16/20 116.2 kg    Labs: Lab Results  Component Value Date   NA 139 04/01/2020   K 4.5 04/01/2020   CL 107 (H) 04/01/2020   CO2 25 04/01/2020   GLUCOSE 106 (H) 04/01/2020   BUN 26 (H) 04/01/2020   CREATININE 0.99 04/01/2020   CALCIUM 8.9 04/01/2020   PHOS 3.5 11/01/2007   MG 2.0 12/07/2018   Lab Results  Component Value Date   INR 0.95 11/18/2014   Lab Results  Component Value Date   CHOL  10/29/2007    100        ATP III CLASSIFICATION:  <200     mg/dL   Desirable  13/02/2007  mg/dL   Borderline High  588-502    mg/dL   High    TRIG >=774 128     GEN- The patient is well appearing, alert and oriented x 3 today.   Head- normocephalic, atraumatic Eyes-  Sclera clear, conjunctiva pink Ears- hearing intact Oropharynx- clear Neck- supple, no JVP Lymph- no cervical lymphadenopathy Lungs- Clear to ausculation bilaterally, normal work of breathing Heart- irregular rate and rhythm, no murmurs, rubs or gallops, PMI not laterally displaced GI- soft, NT, ND, + BS Extremities- no clubbing, cyanosis, or edema MS- no significant deformity or atrophy Skin- no rash or lesion Psych- euthymic mood, full affect Neuro- strength and sensation are intact  EKG-atrial  flutter at 64 bpm, qrs int 106 ms, qtc 422 ms       Assessment and Plan: 1.Persistent  Atrial fib/flutter  S/p ablation 12/2018 and 03/2019 Now off amiodarone and rate controlled for the most part  He was offered Maze, another ablation or rate control going forward and he has chosen rate control   Will try to optimize rate control and BP ( pt seeing elevations at home, but mostly correlate with cortisone shots)  by changing over to long acting Cardizem 180 mg bid  Continue atenolol 50 mg bid   2. CHA2DS2VASc score of 1 (LV dysfunction) Continue eliquis 5 mg bid   3. HTN Stable in office today ( see note above)   He will call back to office with change in cardizem dose in 3 - 5 days, will increase dose if not as well controlled at that time   F/u with Dr. 04/2019 as scheduled in July   Joseantonio Dittmar C. 08-05-1980 [facility] [address] [place], [place] [phone]

## 2021-06-04 ENCOUNTER — Other Ambulatory Visit (HOSPITAL_COMMUNITY): Payer: Self-pay | Admitting: *Deleted

## 2021-06-04 MED ORDER — DILTIAZEM HCL ER COATED BEADS 180 MG PO CP24: 180.0000 mg | ORAL_CAPSULE | Freq: Two times a day (BID) | ORAL | 3 refills | Status: DC

## 2021-06-07 ENCOUNTER — Telehealth (HOSPITAL_COMMUNITY): Payer: Self-pay | Admitting: *Deleted

## 2021-06-07 MED ORDER — DILTIAZEM HCL ER COATED BEADS 240 MG PO CP24: 240.0000 mg | ORAL_CAPSULE | Freq: Every day | ORAL | 3 refills | Status: DC

## 2021-06-07 MED ORDER — DILTIAZEM HCL ER COATED BEADS 180 MG PO CP24: 180.0000 mg | ORAL_CAPSULE | Freq: Every day | ORAL | 3 refills | Status: DC

## 2021-06-07 NOTE — Telephone Encounter (Signed)
Patient called in with update of HRs with cardizem change. Pt HR continue 70-114 he self increased his atenolol to TID over the weekend and his HR was still 121. Discussed with [name] PA will increase cardizem to 240mg  in the AM and 180mg  in the PM return to normal dosing of atenolol. Pt to update later in the week.

## 2021-06-11 NOTE — Telephone Encounter (Signed)
Hey, well I don't think it's working as well as it should. I still wake up with a 115+ after I take the high dose it will bring it down for a little TV., but like right now I'm at 119 and I'm watching tv. The strange thing is I was kinda watching it yesterday while I'm building a deck extreme physical activity and 123 was as high as I saw it! ????    Informed pt I will discuss with Rudi Coco next week.

## 2021-06-15 MED ORDER — ATENOLOL 50 MG PO TABS: 75.0000 mg | ORAL_TABLET | Freq: Two times a day (BID) | ORAL | 9 refills | Status: DC

## 2021-06-15 NOTE — Telephone Encounter (Signed)
Per Rudi Coco NP will increase atenolol to 75mg  BID. PT will notify of response.

## 2021-06-23 ENCOUNTER — Other Ambulatory Visit (HOSPITAL_COMMUNITY): Payer: Self-pay | Admitting: *Deleted

## 2021-06-23 ENCOUNTER — Other Ambulatory Visit: Payer: Self-pay | Admitting: Orthopedic Surgery

## 2021-06-23 ENCOUNTER — Encounter (HOSPITAL_COMMUNITY): Payer: Self-pay

## 2021-06-23 DIAGNOSIS — M545 Low back pain, unspecified: Secondary | ICD-10-CM

## 2021-06-23 DIAGNOSIS — M5410 Radiculopathy, site unspecified: Secondary | ICD-10-CM

## 2021-06-23 MED ORDER — BISOPROLOL FUMARATE 10 MG PO TABS: 10.0000 mg | ORAL_TABLET | Freq: Every day | ORAL | 1 refills | Status: DC

## 2021-07-05 ENCOUNTER — Other Ambulatory Visit (HOSPITAL_COMMUNITY): Payer: Self-pay | Admitting: *Deleted

## 2021-07-05 MED ORDER — ATENOLOL 50 MG PO TABS: 50.0000 mg | ORAL_TABLET | Freq: Two times a day (BID) | ORAL | 3 refills | Status: DC

## 2021-07-07 ENCOUNTER — Other Ambulatory Visit: Payer: Self-pay | Admitting: Family Medicine

## 2021-07-07 DIAGNOSIS — K824 Cholesterolosis of gallbladder: Secondary | ICD-10-CM

## 2021-07-16 ENCOUNTER — Other Ambulatory Visit: Payer: 59

## 2021-07-18 ENCOUNTER — Other Ambulatory Visit: Payer: Self-pay | Admitting: Internal Medicine

## 2021-07-19 ENCOUNTER — Other Ambulatory Visit: Payer: Self-pay

## 2021-07-19 ENCOUNTER — Ambulatory Visit: Payer: 59 | Admitting: Internal Medicine

## 2021-07-19 NOTE — Telephone Encounter (Signed)
Patient called requesting refill for atenolol 50 mg bid because mychart would not let him fill it online for some reason. Patient also mentioned that he does not like the extended release cardizem and feels better when he takes the fast acting and would like to discuss this with Dr. [name] at his apt on 07/28/21. There is already a refill in for atenolol so I took no action to refill at this time. Patient verified pharmacy as [facility] on [address] and thanked me for calling. I informed him to let JA know about the desired medication change at his upcoming apt.

## 2021-07-23 ENCOUNTER — Other Ambulatory Visit: Payer: Self-pay

## 2021-07-23 ENCOUNTER — Ambulatory Visit
Admission: RE | Admit: 2021-07-23 | Discharge: 2021-07-23 | Disposition: A | Discharge status: Home / Self Care | Payer: 59 | Source: Ambulatory Visit | Attending: Family Medicine | Admitting: Family Medicine

## 2021-07-23 DIAGNOSIS — K824 Cholesterolosis of gallbladder: Secondary | ICD-10-CM

## 2021-07-28 ENCOUNTER — Other Ambulatory Visit: Payer: Self-pay

## 2021-07-28 ENCOUNTER — Telehealth (HOSPITAL_BASED_OUTPATIENT_CLINIC_OR_DEPARTMENT_OTHER): Payer: Self-pay | Admitting: Internal Medicine

## 2021-07-28 ENCOUNTER — Ambulatory Visit (INDEPENDENT_AMBULATORY_CARE_PROVIDER_SITE_OTHER): Payer: 59 | Admitting: Internal Medicine

## 2021-07-28 VITALS — BP 108/68 | HR 62 | Ht 69.0 in | Wt 252.0 lb

## 2021-07-28 DIAGNOSIS — I4819 Other persistent atrial fibrillation: Secondary | ICD-10-CM | POA: Diagnosis not present

## 2021-07-28 DIAGNOSIS — I484 Atypical atrial flutter: Secondary | ICD-10-CM | POA: Diagnosis not present

## 2021-07-28 DIAGNOSIS — R000 Tachycardia, unspecified: Secondary | ICD-10-CM

## 2021-07-28 DIAGNOSIS — I43 Cardiomyopathy in diseases classified elsewhere: Secondary | ICD-10-CM | POA: Diagnosis not present

## 2021-07-28 NOTE — Progress Notes (Signed)
PCP: [name], MD   Primary EP: Dr Vanessa Barbara is a 60 y.o. male who presents today for routine electrophysiology followup.  Since last being seen in our clinic, the patient reports doing reasonably well.  He continues to have occasional palpitations with intermittent SOB.  He remains active.  Today, he denies symptoms of chest pain, lower extremity edema, dizziness, presyncope, or syncope.  The patient is otherwise without complaint today.   Past Medical History:  Diagnosis Date   Arthritis    Atrial flutter (HCC)    diagnosed 2005   Depression    Diverticular disease    GERD (gastroesophageal reflux disease)    Obesity    Paroxysmal atrial fibrillation (HCC)    Primary localized osteoarthritis of left knee    Past Surgical History:  Procedure Laterality Date   ATRIAL FIBRILLATION ABLATION N/A 01/24/2019   Procedure: ATRIAL FIBRILLATION ABLATION;  Surgeon: Hillis Range, MD;  [facility];  Service: Cardiovascular;  Laterality: N/A;   ATRIAL FIBRILLATION ABLATION N/A 04/14/2020   Procedure: ATRIAL FIBRILLATION ABLATION;  Surgeon: Hillis Range, MD;  [facility];  Service: Cardiovascular;  Laterality: N/A;   CARDIOVERSION N/A 12/06/2018   Procedure: CARDIOVERSION;  Surgeon: Parke Poisson, MD;  Location: [facility] ENDOSCOPY;  Service: Cardiovascular;  Laterality: N/A;   CARDIOVERSION N/A 03/13/2019   Procedure: CARDIOVERSION;  Surgeon: Wendall Stade, MD;  Location: [facility] ENDOSCOPY;  Service: Cardiovascular;  Laterality: N/A;   CARDIOVERSION N/A 03/05/2020   Procedure: CARDIOVERSION;  Surgeon: Sande Rives, MD;  Location: [facility] ENDOSCOPY;  Service: Cardiovascular;  Laterality: N/A;   COLON SURGERY  12/07, 5/08, 10/08   COLONOSCOPY     COLOSTOMY  2007   COLOSTOMY REVERSAL     EYE SURGERY Bilateral    cataracts   INCISIONAL HERNIA REPAIR  2008   with mesh   KNEE ARTHROSCOPY Left 5/10   x2   MULTIPLE TOOTH EXTRACTIONS      neck fusion  5/11   REPLANTATION THUMB Right    TONSILLECTOMY     TOTAL KNEE ARTHROPLASTY Left 08/23/2017   TOTAL KNEE ARTHROPLASTY Left 08/23/2017   Procedure: LEFT TOTAL KNEE ARTHROPLASTY;  Surgeon: Salvatore Marvel, MD;  [facility] OR;  Service: Orthopedics;  Laterality: Left;   WISDOM TOOTH EXTRACTION      ROS- all systems are reviewed and negatives except as per HPI above  Current Outpatient Medications  Medication Sig Dispense Refill   apixaban (ELIQUIS) 5 MG TABS tablet Take 1 tablet by mouth twice a day 60 tablet 0   atenolol (TENORMIN) 50 MG tablet Take 1 tablet by mouth twice daily 60 tablet 0   clindamycin (CLEOCIN) 300 MG capsule Take 300 mg by mouth every 6 (six) hours.     diazepam (VALIUM) 10 MG tablet Take by mouth.     diclofenac (VOLTAREN) 50 MG EC tablet Take 50 mg by mouth daily.      gabapentin (NEURONTIN) 100 MG capsule Take 100 mg by mouth daily as needed (nerves).      Ginger 500 MG CAPS Take by mouth.     methocarbamol (ROBAXIN) 500 MG tablet Take 500 mg by mouth every 8 (eight) hours as needed for muscle spasms.      oxyCODONE-acetaminophen (PERCOCET/ROXICET) 5-325 MG tablet Take 1 tablet by mouth daily in the afternoon.     sildenafil (REVATIO) 20 MG tablet Take 20 mg by mouth as needed.     traMADol (  ULTRAM) 50 MG tablet Take 100 mg by mouth every 6 (six) hours.     No current facility-administered medications for this visit.    Physical Exam: Vitals:   07/28/21 0945  BP: 108/68  Pulse: 62  SpO2: 94%  Weight: 252 lb (114.3 kg)  Height: 5\' 9"  (1.753 m)    GEN- The patient is well appearing, alert and oriented x 3 today.   Head- normocephalic, atraumatic Eyes-  Sclera clear, conjunctiva pink Ears- hearing intact Oropharynx- clear Lungs- Clear to ausculation bilaterally, normal work of breathing Heart- irregular rate and rhythm, no murmurs, rubs or gallops, PMI not laterally displaced GI- soft, NT, ND, + BS Extremities- no clubbing, cyanosis, or  edema  Echo 02/04/21- EF 55%, LA 5.6 cm  Wt Readings from Last 3 Encounters:  07/28/21 252 lb (114.3 kg)  06/01/21 250 lb 9.6 oz (113.7 kg)  01/18/21 252 lb 9.6 oz (114.6 kg)    EKG tracing ordered today is personally reviewed and shows atypical atrial flutter, rate controlled  Assessment and Plan:  Longstanding persistent atrial fibrillation/ atypical atrial flutter Doing well with rate control (Failed ablation),  declined MAZE/ Convergent procedure previously Chads2vasc score is 1.  He is on eliquis We discussed at length today.  He is s/p PVI x 2 with extensive ablation.  I do think that he would do better in sinus.  Unfortunately, his atrial arrhythmias have also been refractory to tikosyn and amiodarone. I would like for him to discuss surgical options at [facility] with [facility].  I will therefor refer to Dr Herbert Deaner for consideration of convergent procedure.  2. Tachycardia mediated CM EF has recovered with rate control  3. Obesity Body mass index is 37.21 kg/m. Lifestyle modification advised  4. HTN Stable No change required today   Risks, benefits and potential toxicities for medications prescribed and/or refilled reviewed with patient today.   Follow-up in the AF clinic in 3 months  Herbert Deaner MD, [facility] 07/28/2021 10:02 AM

## 2021-07-28 NOTE — Patient Instructions (Signed)
Medication Instructions:  Your physician recommends that you continue on your current medications as directed. Please refer to the Current Medication list given to you today.  *If you need a refill on your cardiac medications before your next appointment, please call your pharmacy*   Lab Work: None ordered.  If you have labs (blood work) drawn today and your tests are completely normal, you will receive your results only by: MyChart Message (if you have MyChart) OR A paper copy in the mail If you have any lab test that is abnormal or we need to change your treatment, we will call you to review the results.   Testing/Procedures: None ordered.    Follow-Up: At [facility], you and your health needs are our priority.  As part of our continuing mission to provide you with exceptional heart care, we have created designated Provider Care Teams.  These Care Teams include your primary Cardiologist (physician) and Advanced Practice Providers (APPs -  Physician Assistants and Nurse Practitioners) who all work together to provide you with the care you need, when you need it.  We recommend signing up for the patient portal called "MyChart".  Sign up information is provided on this After Visit Summary.  MyChart is used to connect with patients for Virtual Visits (Telemedicine).  Patients are able to view lab/test results, encounter notes, upcoming appointments, etc.  Non-urgent messages can be sent to your provider as well.   To learn more about what you can do with MyChart, go to [email or web address].    Your next appointment:   3 months with Afib clinic   Other Instructions  You are being referred to Dr Sheran Luz at [facility]

## 2021-07-28 NOTE — Telephone Encounter (Signed)
Left message for patient regarding the Thursday 11/3/222 9:30 am appointment with Rudi Coco, NP at the [facility]---will mail information to patient and it is available in My Chart.

## 2021-08-10 ENCOUNTER — Encounter (HOSPITAL_BASED_OUTPATIENT_CLINIC_OR_DEPARTMENT_OTHER): Payer: Self-pay

## 2021-08-10 ENCOUNTER — Telehealth (HOSPITAL_BASED_OUTPATIENT_CLINIC_OR_DEPARTMENT_OTHER): Payer: Self-pay | Admitting: Internal Medicine

## 2021-08-10 NOTE — Telephone Encounter (Signed)
Left message for patient to call and discuss office visit with Dr. Herbert Deaner at [facility] (referral by Dr. [name])

## 2021-08-17 ENCOUNTER — Other Ambulatory Visit: Payer: Self-pay | Admitting: Internal Medicine

## 2021-08-17 ENCOUNTER — Other Ambulatory Visit: Payer: Self-pay

## 2021-08-17 MED ORDER — ATENOLOL 50 MG PO TABS: 50.0000 mg | ORAL_TABLET | Freq: Two times a day (BID) | ORAL | 11 refills | Status: DC

## 2021-09-08 ENCOUNTER — Telehealth: Payer: Self-pay | Admitting: Internal Medicine

## 2021-09-08 NOTE — Telephone Encounter (Signed)
[name], Afib Coordinator ant [facility] called. The patient saw Dr. Herbert Deaner and is going to have the procedure Dr. [name] referred him to their practice for. The patient wanted to do his pre-op testing done in [place]. Tresa Endo was hoping to speak to Inetta Fermo about what the patient can do at what location.  Please call Tresa Endo.

## 2021-09-08 NOTE — Telephone Encounter (Signed)
Left message to call back  

## 2021-09-09 NOTE — Telephone Encounter (Signed)
Spoke to [place] and got a list of pre op testing that will need to be completed for the patient. H has requested to have this testing done in [place]. Will discuss with Dr. [name] and update Tresa Endo with [facility].

## 2021-09-09 NOTE — Telephone Encounter (Signed)
Rob Hickman from Afib Coordinator at [facility] is returning a call to [name] [phone]- she is working remote so this is her cell number

## 2021-09-09 NOTE — Telephone Encounter (Signed)
Left message to call back  

## 2021-09-10 NOTE — Telephone Encounter (Signed)
Scheduled mychart video visit with Dr. [name] to discuss pre op testing.  Verbalized agreement and understanding.

## 2021-09-10 NOTE — Telephone Encounter (Signed)
Pt is returning call to [facility] from yesterday. Please advise pt further

## 2021-09-13 ENCOUNTER — Telehealth (INDEPENDENT_AMBULATORY_CARE_PROVIDER_SITE_OTHER): Payer: 59 | Admitting: Internal Medicine

## 2021-09-13 ENCOUNTER — Other Ambulatory Visit: Payer: Self-pay

## 2021-09-13 DIAGNOSIS — R0602 Shortness of breath: Secondary | ICD-10-CM

## 2021-09-13 DIAGNOSIS — D6869 Other thrombophilia: Secondary | ICD-10-CM | POA: Diagnosis not present

## 2021-09-13 DIAGNOSIS — I4819 Other persistent atrial fibrillation: Secondary | ICD-10-CM | POA: Diagnosis not present

## 2021-09-13 DIAGNOSIS — I484 Atypical atrial flutter: Secondary | ICD-10-CM | POA: Diagnosis not present

## 2021-09-13 DIAGNOSIS — Z01818 Encounter for other preprocedural examination: Secondary | ICD-10-CM

## 2021-09-13 NOTE — H&P (View-Only) (Signed)
Electrophysiology TeleHealth Note   Due to national recommendations of social distancing due to COVID 19, an audio/video telehealth visit is felt to be most appropriate for this patient at this time.  See MyChart message from today for the patient's consent to telehealth for [facility].  Date:  09/13/2021   ID:  [name], DOB [birth year], MRN [id number]  Location: patient's home  Provider location:  [address] [place]  Evaluation Performed: Follow-up visit  PCP:  [name], MD   Electrophysiologist:  Dr [name]  Chief Complaint:  afib  History of Present Illness:    [name] is a 60 y.o. male who presents via telehealth conferencing today.  Since last being seen in our clinic, the patient reports doing reasonably well.   He continues to have afib.  + SOB and fatigue with afib.  + occasional chest tightness with RVR.  He was seen at [facility] by Dr Herbert Deaner with plans for possible convergent procedure.  He now presents for further discussions.  Today, he denies symptoms of palpitations,  lower extremity edema, dizziness, presyncope, or syncope.  His primary concern is with back pain. The patient is otherwise without complaint today.   Past Medical History:  Diagnosis Date   Arthritis    Atrial flutter (HCC)    diagnosed 2005   Depression    Diverticular disease    GERD (gastroesophageal reflux disease)    Obesity    Paroxysmal atrial fibrillation (HCC)    Primary localized osteoarthritis of left knee     Past Surgical History:  Procedure Laterality Date   ATRIAL FIBRILLATION ABLATION N/A 01/24/2019   Procedure: ATRIAL FIBRILLATION ABLATION;  Surgeon: Hillis Range, MD;  [facility];  Service: Cardiovascular;  Laterality: N/A;   ATRIAL FIBRILLATION ABLATION N/A 04/14/2020   Procedure: ATRIAL FIBRILLATION ABLATION;  Surgeon: Hillis Range, MD;  [facility];  Service: Cardiovascular;  Laterality: N/A;   CARDIOVERSION N/A  12/06/2018   Procedure: CARDIOVERSION;  Surgeon: Parke Poisson, MD;  Location: [facility] ENDOSCOPY;  Service: Cardiovascular;  Laterality: N/A;   CARDIOVERSION N/A 03/13/2019   Procedure: CARDIOVERSION;  Surgeon: Wendall Stade, MD;  Location: [facility] ENDOSCOPY;  Service: Cardiovascular;  Laterality: N/A;   CARDIOVERSION N/A 03/05/2020   Procedure: CARDIOVERSION;  Surgeon: Sande Rives, MD;  Location: [facility] ENDOSCOPY;  Service: Cardiovascular;  Laterality: N/A;   COLON SURGERY  12/07, 5/08, 10/08   COLONOSCOPY     COLOSTOMY  2007   COLOSTOMY REVERSAL     EYE SURGERY Bilateral    cataracts   INCISIONAL HERNIA REPAIR  2008   with mesh   KNEE ARTHROSCOPY Left 5/10   x2   MULTIPLE TOOTH EXTRACTIONS     neck fusion  5/11   REPLANTATION THUMB Right    TONSILLECTOMY     TOTAL KNEE ARTHROPLASTY Left 08/23/2017   TOTAL KNEE ARTHROPLASTY Left 08/23/2017   Procedure: LEFT TOTAL KNEE ARTHROPLASTY;  Surgeon: Salvatore Marvel, MD;  Location: [facility] OR;  Service: Orthopedics;  Laterality: Left;   WISDOM TOOTH EXTRACTION      Current Outpatient Medications  Medication Sig Dispense Refill   apixaban (ELIQUIS) 5 MG TABS tablet Take 1 tablet by mouth twice a day 60 tablet 0   atenolol (TENORMIN) 50 MG tablet Take 1 tablet (50 mg total) by mouth 2 (two) times daily. 60 tablet 11   clindamycin (CLEOCIN) 300 MG capsule Take 300 mg by mouth every 6 (six) hours.  diazepam (VALIUM) 10 MG tablet Take by mouth.     diclofenac (VOLTAREN) 50 MG EC tablet Take 50 mg by mouth daily.      gabapentin (NEURONTIN) 100 MG capsule Take 100 mg by mouth daily as needed (nerves).      Ginger 500 MG CAPS Take by mouth.     methocarbamol (ROBAXIN) 500 MG tablet Take 500 mg by mouth every 8 (eight) hours as needed for muscle spasms.      oxyCODONE-acetaminophen (PERCOCET/ROXICET) 5-325 MG tablet Take 1 tablet by mouth daily in the afternoon.     sildenafil (REVATIO) 20 MG tablet Take 20 mg by mouth as needed.     traMADol  (ULTRAM) 50 MG tablet Take 100 mg by mouth every 6 (six) hours.     No current facility-administered medications for this visit.    Allergies:   Erythromycin, Hydrocodone-acetaminophen, Mobic [meloxicam], Prednisone, Pregabalin, Z-pak [azithromycin], Lopressor [metoprolol tartrate], and Penicillins   Social History:  The patient  reports that he quit smoking about 9 years ago. His smoking use included cigarettes. He has never used smokeless tobacco. He reports current alcohol use of about 14.0 standard drinks per week. He reports that he does not use drugs.   ROS:  Please see the history of present illness.   All other systems are personally reviewed and negative.    Exam:    Vital Signs:  There were no vitals taken for this visit.  Well sounding and appearing, alert and conversant, regular work of breathing,  good skin color Eyes- anicteric, neuro- grossly intact, skin- no apparent rash or lesions or cyanosis, mouth- oral mucosa is pink  Labs/Other Tests and Data Reviewed:    Recent Labs: No results found for requested labs within last 8760 hours.   Wt Readings from Last 3 Encounters:  07/28/21 252 lb (114.3 kg)  06/01/21 250 lb 9.6 oz (113.7 kg)  01/18/21 252 lb 9.6 oz (114.6 kg)      ASSESSMENT & PLAN:    1.  SOB/ chest tightness/ fatigue with RVR I agree with Dr Herbert Deaner that cath is indicated.   He had severe 3 vessel coronary calcification by prior CT. I would therefore recommend left heart catheterization with possible PCI.  Discussed the cath with the patient. The patient understands that risks included but are not limited to stroke (1 in 1000), death (1 in 1000), kidney failure [usually temporary] (1 in 500), bleeding (1 in 200), allergic reaction [possibly serious] (1 in 200). The patient understands and agrees to proceed.  Hold eliquis 24 hours prior to cath and resume when able post cath.  If he has single vessel CAD, I would anticipate cath.  If he has severe 3 vessel  disease, he would require CABG with MAZE.  If not feasible to be done at [facility], we would have Dr Neysa Hotter team do this at [facility].  If no CAD, we will plan to proceed with convergent procedure at [facility].  Also obtain PFTs to further evaluate SOB  2. Persistent afib I appreciate Dr Neysa Hotter input. As above, if 3v CAD, would anticipate CABG with MAZE.  If he does not have 3V disease, will anticipate convergent procedure. Will obtain PFTs and carotid dopplers prior to convergent procedure.  3. HTN Stable No change required today  4. Obesity Lifestyle modification advised   Risks, benefits and potential toxicities for medications prescribed and/or refilled reviewed with patient today.   Follow-up:  return to see me in 3-4 months (after surgical  AF procedure)   Patient Risk:  after full review of this patients clinical status, I feel that they are at moderate risk at this time.  Today, I have spent 15 minutes with the patient with telehealth technology discussing arrhythmia management .    Randolm Idol, MD  09/13/2021 3:03 PM     [facility] [address] [place] [address] [phone] (office) [phone] (fax)

## 2021-09-13 NOTE — Progress Notes (Signed)
   Electrophysiology TeleHealth Note   Due to national recommendations of social distancing due to COVID 19, an audio/video telehealth visit is felt to be most appropriate for this patient at this time.  See MyChart message from today for the patient's consent to telehealth for [facility].  Date:  09/13/2021   ID:  [name], DOB 04/27/1961, MRN [id number]  Location: patient's home  Provider location:  [address], [place]  Evaluation Performed: Follow-up visit  PCP:  White, Cynthia, MD   Electrophysiologist:  Dr Xiara Knisley  Chief Complaint:  afib  History of Present Illness:    [name] is a 60 y.o. male who presents via telehealth conferencing today.  Since last being seen in our clinic, the patient reports doing reasonably well.   He continues to have afib.  + SOB and fatigue with afib.  + occasional chest tightness with RVR.  He was seen at [facility] by Dr Gehi with plans for possible convergent procedure.  He now presents for further discussions.  Today, he denies symptoms of palpitations,  lower extremity edema, dizziness, presyncope, or syncope.  His primary concern is with back pain. The patient is otherwise without complaint today.   Past Medical History:  Diagnosis Date   Arthritis    Atrial flutter (HCC)    diagnosed 2005   Depression    Diverticular disease    GERD (gastroesophageal reflux disease)    Obesity    Paroxysmal atrial fibrillation (HCC)    Primary localized osteoarthritis of left knee     Past Surgical History:  Procedure Laterality Date   ATRIAL FIBRILLATION ABLATION N/A 01/24/2019   Procedure: ATRIAL FIBRILLATION ABLATION;  Surgeon: Dantonio Justen, MD;  [facility];  Service: Cardiovascular;  Laterality: N/A;   ATRIAL FIBRILLATION ABLATION N/A 04/14/2020   Procedure: ATRIAL FIBRILLATION ABLATION;  Surgeon: Broly Hatfield, MD;  [facility]AS[facility] LAB;  Service: Cardiovascular;  Laterality: N/A;   CARDIOVERSION N/A  12/06/2018   Procedure: CARDIOVERSION;  Surgeon: Acharya, Gayatri A, MD;  Location: [facility] ENDOSCOPY;  Service: Cardiovascular;  Laterality: N/A;   CARDIOVERSION N/A 03/13/2019   Procedure: CARDIOVERSION;  Surgeon: Nishan, Peter C, MD;  Location: [facility] ENDOSCOPY;  Service: Cardiovascular;  Laterality: N/A;   CARDIOVERSION N/A 03/05/2020   Procedure: CARDIOVERSION;  Surgeon: O'Neal, [place] Thomas, MD;  Location: [facility] ENDOSCOPY;  Service: Cardiovascular;  Laterality: N/A;   COLON SURGERY  12/07, 5/08, 10/08   COLONOSCOPY     COLOSTOMY  2007   COLOSTOMY REVERSAL     EYE SURGERY Bilateral    cataracts   INCISIONAL HERNIA REPAIR  2008   with mesh   KNEE ARTHROSCOPY Left 5/10   x2   MULTIPLE TOOTH EXTRACTIONS     neck fusion  5/11   REPLANTATION THUMB Right    TONSILLECTOMY     TOTAL KNEE ARTHROPLASTY Left 08/23/2017   TOTAL KNEE ARTHROPLASTY Left 08/23/2017   Procedure: LEFT TOTAL KNEE ARTHROPLASTY;  Surgeon: Wainer, Robert, MD;  Location: [facility] OR;  Service: Orthopedics;  Laterality: Left;   WISDOM TOOTH EXTRACTION      Current Outpatient Medications  Medication Sig Dispense Refill   apixaban (ELIQUIS) 5 MG TABS tablet Take 1 tablet by mouth twice a day 60 tablet 0   atenolol (TENORMIN) 50 MG tablet Take 1 tablet (50 mg total) by mouth 2 (two) times daily. 60 tablet 11   clindamycin (CLEOCIN) 300 MG capsule Take 300 mg by mouth every 6 (six) hours.       diazepam (VALIUM) 10 MG tablet Take by mouth.     diclofenac (VOLTAREN) 50 MG EC tablet Take 50 mg by mouth daily.      gabapentin (NEURONTIN) 100 MG capsule Take 100 mg by mouth daily as needed (nerves).      Ginger 500 MG CAPS Take by mouth.     methocarbamol (ROBAXIN) 500 MG tablet Take 500 mg by mouth every 8 (eight) hours as needed for muscle spasms.      oxyCODONE-acetaminophen (PERCOCET/ROXICET) 5-325 MG tablet Take 1 tablet by mouth daily in the afternoon.     sildenafil (REVATIO) 20 MG tablet Take 20 mg by mouth as needed.     traMADol  (ULTRAM) 50 MG tablet Take 100 mg by mouth every 6 (six) hours.     No current facility-administered medications for this visit.    Allergies:   Erythromycin, Hydrocodone-acetaminophen, Mobic [meloxicam], Prednisone, Pregabalin, Z-pak [azithromycin], Lopressor [metoprolol tartrate], and Penicillins   Social History:  The patient  reports that he quit smoking about 9 years ago. His smoking use included cigarettes. He has never used smokeless tobacco. He reports current alcohol use of about 14.0 standard drinks per week. He reports that he does not use drugs.   ROS:  Please see the history of present illness.   All other systems are personally reviewed and negative.    Exam:    Vital Signs:  There were no vitals taken for this visit.  Well sounding and appearing, alert and conversant, regular work of breathing,  good skin color Eyes- anicteric, neuro- grossly intact, skin- no apparent rash or lesions or cyanosis, mouth- oral mucosa is pink  Labs/Other Tests and Data Reviewed:    Recent Labs: No results found for requested labs within last 8760 hours.   Wt Readings from Last 3 Encounters:  07/28/21 252 lb (114.3 kg)  06/01/21 250 lb 9.6 oz (113.7 kg)  01/18/21 252 lb 9.6 oz (114.6 kg)      ASSESSMENT & PLAN:    1.  SOB/ chest tightness/ fatigue with RVR I agree with Dr Gehi that cath is indicated.   He had severe 3 vessel coronary calcification by prior CT. I would therefore recommend left heart catheterization with possible PCI.  Discussed the cath with the patient. The patient understands that risks included but are not limited to stroke (1 in 1000), death (1 in 1000), kidney failure [usually temporary] (1 in 500), bleeding (1 in 200), allergic reaction [possibly serious] (1 in 200). The patient understands and agrees to proceed.  Hold eliquis 24 hours prior to cath and resume when able post cath.  If he has single vessel CAD, I would anticipate cath.  If he has severe 3 vessel  disease, he would require CABG with MAZE.  If not feasible to be done at [facility], we would have Dr Gehi's team do this at [facility].  If no CAD, we will plan to proceed with convergent procedure at [facility].  Also obtain PFTs to further evaluate SOB  2. Persistent afib I appreciate Dr Gehi's input. As above, if 3v CAD, would anticipate CABG with MAZE.  If he does not have 3V disease, will anticipate convergent procedure. Will obtain PFTs and carotid dopplers prior to convergent procedure.  3. HTN Stable No change required today  4. Obesity Lifestyle modification advised   Risks, benefits and potential toxicities for medications prescribed and/or refilled reviewed with patient today.   Follow-up:  return to see me in 3-4 months (after surgical   AF procedure)   Patient Risk:  after full review of this patients clinical status, I feel that they are at moderate risk at this time.  Today, I have spent 15 minutes with the patient with telehealth technology discussing arrhythmia management .    Randolm Idol, MD  09/13/2021 3:03 PM     [facility] [address] [place] [address] [phone] (office) [phone] (fax)

## 2021-09-16 ENCOUNTER — Encounter: Payer: Self-pay | Admitting: *Deleted

## 2021-09-16 ENCOUNTER — Telehealth: Payer: Self-pay | Admitting: *Deleted

## 2021-09-16 DIAGNOSIS — R931 Abnormal findings on diagnostic imaging of heart and coronary circulation: Secondary | ICD-10-CM

## 2021-09-16 DIAGNOSIS — Z01818 Encounter for other preprocedural examination: Secondary | ICD-10-CM

## 2021-09-16 NOTE — Telephone Encounter (Signed)
Patient called back and asked for his cath to be moved to the following week. Reschedule the procedure and sent a new letter to the patient.

## 2021-09-16 NOTE — Telephone Encounter (Signed)
Went over instructions for cath scheduled for next week. Sent letter over [employer]. Scheduled pre op labs.  Patient verbalized understanding.

## 2021-09-20 ENCOUNTER — Other Ambulatory Visit: Payer: Self-pay

## 2021-09-20 ENCOUNTER — Other Ambulatory Visit: Payer: 59 | Admitting: *Deleted

## 2021-09-20 DIAGNOSIS — R931 Abnormal findings on diagnostic imaging of heart and coronary circulation: Secondary | ICD-10-CM

## 2021-09-20 DIAGNOSIS — Z01818 Encounter for other preprocedural examination: Secondary | ICD-10-CM

## 2021-09-20 LAB — CBC
Hematocrit: 48 % (ref 37.5–51.0)
Hemoglobin: 16.6 g/dL (ref 13.0–17.7)
MCH: 31.9 pg (ref 26.6–33.0)
MCHC: 34.6 g/dL (ref 31.5–35.7)
MCV: 92 fL (ref 79–97)
Platelets: 221 10*3/uL (ref 150–450)
RBC: 5.2 x10E6/uL (ref 4.14–5.80)
RDW: 11.9 % (ref 11.6–15.4)
WBC: 9.6 10*3/uL (ref 3.4–10.8)

## 2021-09-20 LAB — BASIC METABOLIC PANEL
BUN/Creatinine Ratio: 17 (ref 10–24)
BUN: 15 mg/dL (ref 8–27)
CO2: 22 mmol/L (ref 20–29)
Calcium: 8.8 mg/dL (ref 8.6–10.2)
Chloride: 106 mmol/L (ref 96–106)
Creatinine, Ser: 0.88 mg/dL (ref 0.76–1.27)
Glucose: 106 mg/dL — ABNORMAL HIGH (ref 65–99)
Potassium: 4.7 mmol/L (ref 3.5–5.2)
Sodium: 142 mmol/L (ref 134–144)
eGFR: 98 mL/min/{1.73_m2} (ref >59)

## 2021-09-28 ENCOUNTER — Other Ambulatory Visit: Payer: Self-pay

## 2021-09-28 ENCOUNTER — Ambulatory Visit (HOSPITAL_BASED_OUTPATIENT_CLINIC_OR_DEPARTMENT_OTHER)
Admission: RE | Admit: 2021-09-28 | Discharge: 2021-09-28 | Disposition: A | Discharge status: Home / Self Care | Payer: 59 | Source: Ambulatory Visit | Attending: Internal Medicine | Admitting: Internal Medicine

## 2021-09-28 DIAGNOSIS — I4819 Other persistent atrial fibrillation: Secondary | ICD-10-CM | POA: Diagnosis present

## 2021-09-28 DIAGNOSIS — Z01818 Encounter for other preprocedural examination: Secondary | ICD-10-CM | POA: Insufficient documentation

## 2021-09-30 ENCOUNTER — Telehealth: Payer: Self-pay | Admitting: *Deleted

## 2021-09-30 NOTE — H&P (Signed)
DOE, chest tightness, AF with RVR, and 3 vessel CAC.   Cardiac CT with morphology 04/08/20: IMPRESSION: 1. Calcium score 1083 which is 97 th percentile for age consider f/u perfusions study to r/o obstructive disease Note this was also recommended on CT done January 24 th 2020.  2.  Moderate bi atrial enlargement with no LAA thrombus.  3. Normal PV anatomy including right middle vein see measurements above.  4.  Normal aortic root 3.2 cm.  5.  No pericardial effusion  6.  Dilated MPA 3.1 cm

## 2021-09-30 NOTE — Telephone Encounter (Signed)
Cardiac catheterization scheduled at [facility] for: Friday October 01, 2021 7:30 AM Arrive [facility] Main Entrance A [facility]) at: 5:30 AM   No solid food after midnight prior to cath, clear liquids until 5 AM day of procedure.  Medication instructions: Hold: Eliquis-none 09/29/21 until post procedure  Except hold medications usual morning medications can be taken pre-cath with sips of water including aspirin 81 mg.    Confirmed patient has responsible adult to drive home post procedure and be with patient first 24 hours after arriving home.  [facility] does allow one visitor to accompany you and wait in the hospital waiting room while you are there for your procedure. You and your visitor will be asked to wear a mask once you enter the hospital.   Patient reports does not currently have any symptoms concerning for COVID-19 and no household members with COVID-19 like illness.                          Reviewed procedure/mask/visitor instructions with patient.

## 2021-10-01 ENCOUNTER — Ambulatory Visit (HOSPITAL_COMMUNITY)
Admission: RE | Admit: 2021-10-01 | Discharge: 2021-10-01 | Disposition: A | Discharge status: Home / Self Care | Payer: 59 | Source: Ambulatory Visit | Attending: Interventional Cardiology | Admitting: Interventional Cardiology

## 2021-10-01 ENCOUNTER — Other Ambulatory Visit: Payer: Self-pay | Admitting: Rehabilitation

## 2021-10-01 ENCOUNTER — Encounter (HOSPITAL_COMMUNITY)
Admission: RE | Disposition: A | Discharge status: Home / Self Care | Payer: Self-pay | Source: Ambulatory Visit | Attending: Interventional Cardiology

## 2021-10-01 ENCOUNTER — Encounter (HOSPITAL_COMMUNITY): Payer: Self-pay | Admitting: Interventional Cardiology

## 2021-10-01 ENCOUNTER — Other Ambulatory Visit: Payer: Self-pay

## 2021-10-01 DIAGNOSIS — E669 Obesity, unspecified: Secondary | ICD-10-CM | POA: Insufficient documentation

## 2021-10-01 DIAGNOSIS — Z87891 Personal history of nicotine dependence: Secondary | ICD-10-CM | POA: Insufficient documentation

## 2021-10-01 DIAGNOSIS — R0789 Other chest pain: Secondary | ICD-10-CM | POA: Diagnosis not present

## 2021-10-01 DIAGNOSIS — Z888 Allergy status to other drugs, medicaments and biological substances status: Secondary | ICD-10-CM | POA: Diagnosis not present

## 2021-10-01 DIAGNOSIS — I119 Hypertensive heart disease without heart failure: Secondary | ICD-10-CM | POA: Diagnosis not present

## 2021-10-01 DIAGNOSIS — Z7901 Long term (current) use of anticoagulants: Secondary | ICD-10-CM | POA: Insufficient documentation

## 2021-10-01 DIAGNOSIS — I2510 Atherosclerotic heart disease of native coronary artery without angina pectoris: Secondary | ICD-10-CM | POA: Diagnosis not present

## 2021-10-01 DIAGNOSIS — R0602 Shortness of breath: Secondary | ICD-10-CM | POA: Insufficient documentation

## 2021-10-01 DIAGNOSIS — I4891 Unspecified atrial fibrillation: Secondary | ICD-10-CM | POA: Diagnosis present

## 2021-10-01 DIAGNOSIS — R079 Chest pain, unspecified: Secondary | ICD-10-CM

## 2021-10-01 DIAGNOSIS — Z881 Allergy status to other antibiotic agents status: Secondary | ICD-10-CM | POA: Insufficient documentation

## 2021-10-01 DIAGNOSIS — M5451 Vertebrogenic low back pain: Secondary | ICD-10-CM

## 2021-10-01 DIAGNOSIS — Z79899 Other long term (current) drug therapy: Secondary | ICD-10-CM | POA: Insufficient documentation

## 2021-10-01 DIAGNOSIS — R0609 Other forms of dyspnea: Secondary | ICD-10-CM

## 2021-10-01 DIAGNOSIS — Z880 Allergy status to penicillin: Secondary | ICD-10-CM | POA: Insufficient documentation

## 2021-10-01 DIAGNOSIS — Z791 Long term (current) use of non-steroidal anti-inflammatories (NSAID): Secondary | ICD-10-CM | POA: Insufficient documentation

## 2021-10-01 DIAGNOSIS — I4819 Other persistent atrial fibrillation: Secondary | ICD-10-CM | POA: Diagnosis not present

## 2021-10-01 DIAGNOSIS — Z885 Allergy status to narcotic agent status: Secondary | ICD-10-CM | POA: Diagnosis not present

## 2021-10-01 DIAGNOSIS — M47819 Spondylosis without myelopathy or radiculopathy, site unspecified: Secondary | ICD-10-CM

## 2021-10-01 DIAGNOSIS — Z6835 Body mass index (BMI) 35.0-35.9, adult: Secondary | ICD-10-CM | POA: Insufficient documentation

## 2021-10-01 DIAGNOSIS — I4811 Longstanding persistent atrial fibrillation: Secondary | ICD-10-CM

## 2021-10-01 HISTORY — PX: LEFT HEART CATH AND CORONARY ANGIOGRAPHY: CATH118249

## 2021-10-01 SURGERY — LEFT HEART CATH AND CORONARY ANGIOGRAPHY
Anesthesia: LOCAL

## 2021-10-01 MED ORDER — LIDOCAINE HCL (PF) 1 % IJ SOLN
INTRAMUSCULAR | Status: AC
  Filled 2021-10-01: qty 30

## 2021-10-01 MED ORDER — MIDAZOLAM HCL 2 MG/2ML IJ SOLN
INTRAMUSCULAR | Status: AC
  Filled 2021-10-01: qty 2

## 2021-10-01 MED ORDER — HEPARIN SODIUM (PORCINE) 1000 UNIT/ML IJ SOLN
INTRAMUSCULAR | Status: AC
  Filled 2021-10-01: qty 1

## 2021-10-01 MED ORDER — SODIUM CHLORIDE 0.9% FLUSH: 3.0000 mL | Freq: Two times a day (BID) | INTRAVENOUS | Status: DC

## 2021-10-01 MED ORDER — VERAPAMIL HCL 2.5 MG/ML IV SOLN
INTRAVENOUS | Status: AC
  Filled 2021-10-01: qty 2

## 2021-10-01 MED ORDER — LABETALOL HCL 5 MG/ML IV SOLN: 10.0000 mg | INTRAVENOUS | Status: DC | PRN

## 2021-10-01 MED ORDER — SODIUM CHLORIDE 0.9 % IV SOLN: 250.0000 mL | INTRAVENOUS | Status: DC | PRN

## 2021-10-01 MED ORDER — HEPARIN (PORCINE) IN NACL 1000-0.9 UT/500ML-% IV SOLN
INTRAVENOUS | Status: DC | PRN
  Administered 2021-10-01 (×2): 500 mL

## 2021-10-01 MED ORDER — SODIUM CHLORIDE 0.9 % WEIGHT BASED INFUSION: 1.0000 mL/kg/h | INTRAVENOUS | Status: DC

## 2021-10-01 MED ORDER — HYDRALAZINE HCL 20 MG/ML IJ SOLN: 10.0000 mg | INTRAMUSCULAR | Status: DC | PRN

## 2021-10-01 MED ORDER — HEPARIN SODIUM (PORCINE) 1000 UNIT/ML IJ SOLN
INTRAMUSCULAR | Status: DC | PRN
  Administered 2021-10-01: 5000 [IU] via INTRAVENOUS

## 2021-10-01 MED ORDER — SODIUM CHLORIDE 0.9% FLUSH: 3.0000 mL | INTRAVENOUS | Status: DC | PRN

## 2021-10-01 MED ORDER — FENTANYL CITRATE (PF) 100 MCG/2ML IJ SOLN
INTRAMUSCULAR | Status: DC | PRN
  Administered 2021-10-01 (×2): 25 ug via INTRAVENOUS

## 2021-10-01 MED ORDER — ASPIRIN 81 MG PO CHEW: 81.0000 mg | CHEWABLE_TABLET | ORAL | Status: AC

## 2021-10-01 MED ORDER — APIXABAN 5 MG PO TABS: ORAL_TABLET | ORAL | 0 refills | Status: DC

## 2021-10-01 MED ORDER — HEPARIN (PORCINE) IN NACL 1000-0.9 UT/500ML-% IV SOLN
INTRAVENOUS | Status: AC
  Filled 2021-10-01: qty 1000

## 2021-10-01 MED ORDER — IOHEXOL 350 MG/ML SOLN
INTRAVENOUS | Status: DC | PRN
  Administered 2021-10-01: 55 mL

## 2021-10-01 MED ORDER — FENTANYL CITRATE (PF) 100 MCG/2ML IJ SOLN
INTRAMUSCULAR | Status: AC
  Filled 2021-10-01: qty 2

## 2021-10-01 MED ORDER — MIDAZOLAM HCL 2 MG/2ML IJ SOLN
INTRAMUSCULAR | Status: DC | PRN
  Administered 2021-10-01 (×2): 1 mg via INTRAVENOUS

## 2021-10-01 MED ORDER — VERAPAMIL HCL 2.5 MG/ML IV SOLN
INTRAVENOUS | Status: DC | PRN
  Administered 2021-10-01: 10 mL via INTRA_ARTERIAL

## 2021-10-01 MED ORDER — SODIUM CHLORIDE 0.9 % IV SOLN: INTRAVENOUS | Status: DC

## 2021-10-01 MED ORDER — ACETAMINOPHEN 325 MG PO TABS: 650.0000 mg | ORAL_TABLET | ORAL | Status: DC | PRN

## 2021-10-01 MED ORDER — ONDANSETRON HCL 4 MG/2ML IJ SOLN: 4.0000 mg | Freq: Four times a day (QID) | INTRAMUSCULAR | Status: DC | PRN

## 2021-10-01 MED ORDER — LIDOCAINE HCL (PF) 1 % IJ SOLN
INTRAMUSCULAR | Status: DC | PRN
  Administered 2021-10-01: 2 mL

## 2021-10-01 MED ORDER — SODIUM CHLORIDE 0.9 % WEIGHT BASED INFUSION
3.0000 mL/kg/h | INTRAVENOUS | Status: AC
  Administered 2021-10-01: 3 mL/kg/h via INTRAVENOUS

## 2021-10-01 SURGICAL SUPPLY — 10 items
CATH 5FR JL3.5 JR4 ANG PIG MP (CATHETERS) ×1 IMPLANT
DEVICE RAD COMP TR BAND LRG (VASCULAR PRODUCTS) ×2 IMPLANT
GLIDESHEATH SLEND A-KIT 6F 22G (SHEATH) ×1 IMPLANT
GUIDEWIRE INQWIRE 1.5J.035X260 (WIRE) ×1 IMPLANT
INQWIRE 1.5J .035X260CM (WIRE) ×2
KIT HEART LEFT (KITS) ×2 IMPLANT
PACK CARDIAC CATHETERIZATION (CUSTOM PROCEDURE TRAY) ×2 IMPLANT
SHEATH PROBE COVER 6X72 (BAG) ×1 IMPLANT
TRANSDUCER W/STOPCOCK (MISCELLANEOUS) ×2 IMPLANT
TUBING CIL FLEX 10 FLL-RA (TUBING) ×2 IMPLANT

## 2021-10-01 NOTE — CV Procedure (Signed)
Diffuse three-vessel atherosclerosis without significant obstructive disease. Low normal EF in the 45 to 55% range. Normal LVEDP.

## 2021-10-01 NOTE — Discharge Instructions (Signed)
Radial Site Care  This sheet gives you information about how to care for yourself after your procedure. Your health care provider may also give you more specific instructions. If you have problems or questions, contact your health care provider. What can I expect after the procedure? After the procedure, it is common to have: Bruising and tenderness at the catheter insertion area. Follow these instructions at home: Medicines Take over-the-counter and prescription medicines only as told by your health care provider. Insertion site care Follow instructions from your health care provider about how to take care of your insertion site. Make sure you: Wash your hands with soap and water before you remove your bandage (dressing). If soap and water are not available, use hand sanitizer. May remove dressing in 24 hours. Check your insertion site every day for signs of infection. Check for: Redness, swelling, or pain. Fluid or blood. Pus or a bad smell. Warmth. Do no take baths, swim, or use a hot tub for 5 days. You may shower 24-48 hours after the procedure. Remove the dressing and gently wash the site with plain soap and water. Pat the area dry with a clean towel. Do not rub the site. That could cause bleeding. Do not apply powder or lotion to the site. Activity  For 24 hours after the procedure, or as directed by your health care provider: Do not flex or bend the affected arm. Do not push or pull heavy objects with the affected arm. Do not drive yourself home from the hospital or clinic. You may drive 24 hours after the procedure. Do not operate machinery or power tools. KEEP ARM ELEVATED THE REMAINDER OF THE DAY. Do not push, pull or lift anything that is heavier than 10 lb for 5 days. Ask your health care provider when it is okay to: Return to work or school. Resume usual physical activities or sports. Resume sexual activity. General instructions If the catheter site starts to  bleed, raise your arm and put firm pressure on the site. If the bleeding does not stop, get help right away. This is a medical emergency. DRINK PLENTY OF FLUIDS FOR THE NEXT 2-3 DAYS. No alcohol consumption for 24 hours after receiving sedation. If you went home on the same day as your procedure, a responsible adult should be with you for the first 24 hours after you arrive home. Keep all follow-up visits as told by your health care provider. This is important. Contact a health care provider if: You have a fever. You have redness, swelling, or yellow drainage around your insertion site. Get help right away if: You have unusual pain at the radial site. The catheter insertion area swells very fast. The insertion area is bleeding, and the bleeding does not stop when you hold steady pressure on the area. Your arm or hand becomes pale, cool, tingly, or numb. These symptoms may represent a serious problem that is an emergency. Do not wait to see if the symptoms will go away. Get medical help right away. Call your local emergency services (911 in the [place] Do not drive yourself to the hospital. Summary After the procedure, it is common to have bruising and tenderness at the site. Follow instructions from your health care provider about how to take care of your radial site wound. Check the wound every day for signs of infection.  This information is not intended to replace advice given to you by your health care provider. Make sure you discuss any questions you have with   your health care provider. Document Revised: 01/17/2018 Document Reviewed: 01/17/2018 Elsevier Patient Education  2020 Elsevier Inc.  

## 2021-10-01 NOTE — Progress Notes (Signed)
Pt ambulated without difficulty or bleeding.   Discharged home with his wife who will drive and stay with pt x 24 hrs. 

## 2021-10-01 NOTE — Interval H&P Note (Signed)
Cath Lab Visit (complete for each Cath Lab visit)  Clinical Evaluation Leading to the Procedure:   ACS: Yes.    Non-ACS:    Anginal Classification: CCS III  Anti-ischemic medical therapy: Minimal Therapy (1 class of medications)  Non-Invasive Test Results: No non-invasive testing performed  Prior CABG: No previous CABG      History and Physical Interval Note:  10/01/2021 7:30 AM  [name]  has presented today for surgery, with the diagnosis of abnormal CT.  The various methods of treatment have been discussed with the patient and family. After consideration of risks, benefits and other options for treatment, the patient has consented to  Procedure(s): LEFT HEART CATH AND CORONARY ANGIOGRAPHY (N/A) as a surgical intervention.  The patient's history has been reviewed, patient examined, no change in status, stable for surgery.  I have reviewed the patient's chart and labs.  Questions were answered to the patient's satisfaction.     Lyn Records III

## 2021-10-07 ENCOUNTER — Other Ambulatory Visit: Payer: Self-pay | Admitting: Internal Medicine

## 2021-10-07 NOTE — Telephone Encounter (Signed)
Pt's medication was sent to pt's pharmacy as requested. Confirmation received.  °

## 2021-10-14 ENCOUNTER — Other Ambulatory Visit: Payer: Self-pay

## 2021-10-14 ENCOUNTER — Ambulatory Visit
Admission: RE | Admit: 2021-10-14 | Discharge: 2021-10-14 | Disposition: A | Discharge status: Home / Self Care | Payer: 59 | Source: Ambulatory Visit | Attending: Rehabilitation | Admitting: Rehabilitation

## 2021-10-14 DIAGNOSIS — M5451 Vertebrogenic low back pain: Secondary | ICD-10-CM

## 2021-10-14 DIAGNOSIS — M47819 Spondylosis without myelopathy or radiculopathy, site unspecified: Secondary | ICD-10-CM

## 2021-10-14 MED ORDER — GADOBENATE DIMEGLUMINE 529 MG/ML IV SOLN
20.0000 mL | Freq: Once | INTRAVENOUS | Status: AC | PRN
  Administered 2021-10-14: 20 mL via INTRAVENOUS

## 2021-10-25 ENCOUNTER — Ambulatory Visit (HOSPITAL_COMMUNITY): Payer: 59 | Admitting: Nurse Practitioner

## 2021-10-28 ENCOUNTER — Ambulatory Visit (HOSPITAL_COMMUNITY): Payer: 59 | Admitting: Nurse Practitioner

## 2021-12-04 ENCOUNTER — Encounter: Payer: Self-pay | Admitting: Internal Medicine

## 2021-12-19 ENCOUNTER — Other Ambulatory Visit: Payer: Self-pay | Admitting: Nurse Practitioner

## 2021-12-19 DIAGNOSIS — I4811 Longstanding persistent atrial fibrillation: Secondary | ICD-10-CM

## 2021-12-21 ENCOUNTER — Encounter: Payer: Self-pay | Admitting: Internal Medicine

## 2021-12-21 ENCOUNTER — Other Ambulatory Visit (INDEPENDENT_AMBULATORY_CARE_PROVIDER_SITE_OTHER): Payer: Self-pay | Admitting: Interventional Cardiology

## 2021-12-21 DIAGNOSIS — I4811 Longstanding persistent atrial fibrillation: Secondary | ICD-10-CM

## 2021-12-21 NOTE — Telephone Encounter (Signed)
Pt last saw Dr [name] 09/13/21, last labs 11/30/21 Creat 0.77, age 60, weight 108.9kg, based on specified criteria pt is on appropriate dosage of Eliquis 5mg  BID for afib/aflutter.  Will refill rx.

## 2022-01-11 ENCOUNTER — Other Ambulatory Visit: Payer: Self-pay | Admitting: Orthopedic Surgery

## 2022-01-19 ENCOUNTER — Other Ambulatory Visit: Payer: 59

## 2022-01-20 ENCOUNTER — Encounter (HOSPITAL_COMMUNITY): Payer: Self-pay | Admitting: Vascular Surgery

## 2022-01-24 ENCOUNTER — Inpatient Hospital Stay (HOSPITAL_COMMUNITY): Admission: RE | Admit: 2022-01-24 | Payer: 59 | Source: Ambulatory Visit

## 2022-01-27 ENCOUNTER — Inpatient Hospital Stay (HOSPITAL_COMMUNITY): Admission: RE | Admit: 2022-01-27 | Payer: Medicare Other | Source: Home / Self Care | Admitting: Orthopedic Surgery

## 2022-01-27 ENCOUNTER — Encounter (HOSPITAL_COMMUNITY): Admission: RE | Payer: Self-pay | Source: Home / Self Care

## 2022-01-27 SURGERY — TRANSFORAMINAL LUMBAR INTERBODY FUSION (TLIF) WITH PEDICLE SCREW FIXATION 2 LEVEL
Anesthesia: General | Laterality: Left

## 2022-02-13 ENCOUNTER — Emergency Department (HOSPITAL_COMMUNITY): Payer: 59

## 2022-02-13 ENCOUNTER — Telehealth: Payer: Self-pay | Admitting: Internal Medicine

## 2022-02-13 ENCOUNTER — Other Ambulatory Visit (HOSPITAL_COMMUNITY): Payer: 59

## 2022-02-13 ENCOUNTER — Other Ambulatory Visit: Payer: Self-pay

## 2022-02-13 ENCOUNTER — Observation Stay (HOSPITAL_COMMUNITY)
Admission: EM | Admit: 2022-02-13 | Discharge: 2022-02-13 | Disposition: A | Discharge status: Home / Self Care | Payer: 59 | Attending: Internal Medicine | Admitting: Internal Medicine

## 2022-02-13 DIAGNOSIS — I4811 Longstanding persistent atrial fibrillation: Secondary | ICD-10-CM | POA: Diagnosis not present

## 2022-02-13 DIAGNOSIS — Z96652 Presence of left artificial knee joint: Secondary | ICD-10-CM | POA: Diagnosis not present

## 2022-02-13 DIAGNOSIS — R0789 Other chest pain: Secondary | ICD-10-CM | POA: Diagnosis present

## 2022-02-13 DIAGNOSIS — I480 Paroxysmal atrial fibrillation: Secondary | ICD-10-CM | POA: Diagnosis not present

## 2022-02-13 DIAGNOSIS — I2510 Atherosclerotic heart disease of native coronary artery without angina pectoris: Secondary | ICD-10-CM | POA: Insufficient documentation

## 2022-02-13 DIAGNOSIS — I10 Essential (primary) hypertension: Secondary | ICD-10-CM | POA: Insufficient documentation

## 2022-02-13 DIAGNOSIS — Z7901 Long term (current) use of anticoagulants: Secondary | ICD-10-CM | POA: Insufficient documentation

## 2022-02-13 DIAGNOSIS — R0989 Other specified symptoms and signs involving the circulatory and respiratory systems: Secondary | ICD-10-CM

## 2022-02-13 DIAGNOSIS — Z79899 Other long term (current) drug therapy: Secondary | ICD-10-CM | POA: Diagnosis not present

## 2022-02-13 DIAGNOSIS — I4891 Unspecified atrial fibrillation: Secondary | ICD-10-CM

## 2022-02-13 DIAGNOSIS — Z20822 Contact with and (suspected) exposure to covid-19: Secondary | ICD-10-CM | POA: Diagnosis not present

## 2022-02-13 LAB — CBC
HCT: 46.3 % (ref 39.0–52.0)
Hemoglobin: 15.3 g/dL (ref 13.0–17.0)
MCH: 30 pg (ref 26.0–34.0)
MCHC: 33 g/dL (ref 30.0–36.0)
MCV: 90.8 fL (ref 80.0–100.0)
Platelets: 204 10*3/uL (ref 150–400)
RBC: 5.1 MIL/uL (ref 4.22–5.81)
RDW: 14.4 % (ref 11.5–15.5)
WBC: 14 10*3/uL — ABNORMAL HIGH (ref 4.0–10.5)
nRBC: 0 % (ref 0.0–0.2)

## 2022-02-13 LAB — RESPIRATORY PANEL BY PCR

## 2022-02-13 LAB — BASIC METABOLIC PANEL
Anion gap: 14 (ref 5–15)
BUN: 21 mg/dL (ref 8–23)
CO2: 20 mmol/L — ABNORMAL LOW (ref 22–32)
Calcium: 9.5 mg/dL (ref 8.9–10.3)
Chloride: 103 mmol/L (ref 98–111)
Creatinine, Ser: 0.87 mg/dL (ref 0.61–1.24)
GFR, Estimated: >60 mL/min (ref >60)
Glucose, Bld: 110 mg/dL — ABNORMAL HIGH (ref 70–99)
Potassium: 4.2 mmol/L (ref 3.5–5.1)
Sodium: 137 mmol/L (ref 135–145)

## 2022-02-13 LAB — RESP PANEL BY RT-PCR (FLU A&B, COVID) ARPGX2
Influenza A by PCR: NEGATIVE
Influenza B by PCR: NEGATIVE
SARS Coronavirus 2 by RT PCR: NEGATIVE

## 2022-02-13 LAB — TROPONIN I (HIGH SENSITIVITY)
Troponin I (High Sensitivity): 12 ng/L (ref <18)
Troponin I (High Sensitivity): 18 ng/L — ABNORMAL HIGH (ref <18)

## 2022-02-13 LAB — MAGNESIUM: Magnesium: 1.8 mg/dL (ref 1.7–2.4)

## 2022-02-13 MED ORDER — DILTIAZEM HCL-DEXTROSE 125-5 MG/125ML-% IV SOLN (PREMIX)
5.0000 mg/h | INTRAVENOUS | Status: DC
  Administered 2022-02-13: 5 mg/h via INTRAVENOUS
  Filled 2022-02-13: qty 125

## 2022-02-13 MED ORDER — ACETAMINOPHEN 325 MG PO TABS
650.0000 mg | ORAL_TABLET | Freq: Four times a day (QID) | ORAL | Status: DC | PRN
  Administered 2022-02-13: 650 mg via ORAL
  Filled 2022-02-13: qty 2

## 2022-02-13 MED ORDER — AMIODARONE HCL 200 MG PO TABS: 400.0000 mg | ORAL_TABLET | Freq: Two times a day (BID) | ORAL | Status: DC

## 2022-02-13 MED ORDER — APIXABAN 5 MG PO TABS
5.0000 mg | ORAL_TABLET | Freq: Two times a day (BID) | ORAL | Status: DC
  Administered 2022-02-13: 5 mg via ORAL
  Filled 2022-02-13: qty 1

## 2022-02-13 MED ORDER — ATENOLOL 25 MG PO TABS
50.0000 mg | ORAL_TABLET | Freq: Two times a day (BID) | ORAL | Status: DC
  Administered 2022-02-13: 50 mg via ORAL
  Filled 2022-02-13: qty 2

## 2022-02-13 MED ORDER — DILTIAZEM LOAD VIA INFUSION
10.0000 mg | Freq: Once | INTRAVENOUS | Status: AC
  Administered 2022-02-13: 10 mg via INTRAVENOUS
  Filled 2022-02-13: qty 10

## 2022-02-13 MED ORDER — TRAMADOL HCL 50 MG PO TABS
100.0000 mg | ORAL_TABLET | Freq: Three times a day (TID) | ORAL | Status: DC
  Administered 2022-02-13: 100 mg via ORAL
  Filled 2022-02-13: qty 2

## 2022-02-13 MED ORDER — DICLOFENAC SODIUM 1 % EX GEL: 1.0000 "application " | Freq: Four times a day (QID) | CUTANEOUS | Status: DC | PRN

## 2022-02-13 MED ORDER — AMIODARONE HCL 200 MG PO TABS
400.0000 mg | ORAL_TABLET | Freq: Two times a day (BID) | ORAL | Status: DC
Start: 2022-02-13 — End: 2022-02-13

## 2022-02-13 MED ORDER — AMIODARONE LOAD VIA INFUSION
150.0000 mg | Freq: Once | INTRAVENOUS | Status: AC
  Administered 2022-02-13: 150 mg via INTRAVENOUS
  Filled 2022-02-13: qty 83.34

## 2022-02-13 MED ORDER — ONDANSETRON HCL 4 MG/2ML IJ SOLN
4.0000 mg | Freq: Four times a day (QID) | INTRAMUSCULAR | Status: DC | PRN
Start: 2022-02-13 — End: 2022-02-13

## 2022-02-13 MED ORDER — AMIODARONE HCL IN DEXTROSE 360-4.14 MG/200ML-% IV SOLN
60.0000 mg/h | INTRAVENOUS | Status: DC
  Administered 2022-02-13 (×2): 60 mg/h via INTRAVENOUS
  Filled 2022-02-13 (×2): qty 200

## 2022-02-13 MED ORDER — DOCUSATE SODIUM 100 MG PO CAPS
100.0000 mg | ORAL_CAPSULE | Freq: Every day | ORAL | Status: DC
  Administered 2022-02-13: 100 mg via ORAL
  Filled 2022-02-13: qty 1

## 2022-02-13 MED ORDER — OXYCODONE-ACETAMINOPHEN 5-325 MG PO TABS
1.0000 | ORAL_TABLET | Freq: Two times a day (BID) | ORAL | Status: DC
  Administered 2022-02-13: 1 via ORAL
  Filled 2022-02-13: qty 1

## 2022-02-13 MED ORDER — OXYCODONE HCL 5 MG PO TABS
10.0000 mg | ORAL_TABLET | Freq: Once | ORAL | Status: AC
  Administered 2022-02-13: 10 mg via ORAL
  Filled 2022-02-13: qty 2

## 2022-02-13 MED ORDER — AMIODARONE HCL IN DEXTROSE 360-4.14 MG/200ML-% IV SOLN: 30.0000 mg/h | INTRAVENOUS | Status: DC

## 2022-02-13 NOTE — ED Notes (Signed)
Secretary ordering hospital bed for pt.

## 2022-02-13 NOTE — ED Notes (Signed)
Pt refused rectal temp. Horton MD aware.

## 2022-02-13 NOTE — ED Triage Notes (Signed)
Pt bib [facility] from home c/o chest pain for 1 hour. Central CP that radiates to the neck. Pt has extensive cardiac history of multiple cardioversions, albasions, and afib. Pt was sinus w/PAC then went into afib with EMS. EMS gave 324mg  aspirin. Pt took erectile dysfunction medication yesterday. VSS.

## 2022-02-13 NOTE — ED Notes (Signed)
Lunch tray delivered. Pt refused it. It is currently sitting at the nurses station.

## 2022-02-13 NOTE — Progress Notes (Signed)
Personally seen and examined. Agree with overnight MD as separate note above with the following comments: Briefly 61 yo M with complex AF who prior ablation who presented with AF RVR Patient notes that after working hard yesterday chopping wood, drank a [employer].  Has not had any caffeine or alcohol outside of this since ablation.  He felt palpitations in AF but normally just feels unwell. Exam notable for RRR, he is back in SR.  He feels well and is eager to go home.  Has [facility] EP f/u on Tuesday  Will plan for discharge on PO amiodarone (400 mg PO BID).  Patient has this at home.  Has very close EP follow up.    Riley Lam, MD Cardiologist [facility]  [address] [place], [address] [place], [place] [address] [phone]  12:32 PM

## 2022-02-13 NOTE — H&P (Signed)
Cardiology Admission History and Physical:   Patient ID: [name] MRN: [id number]; DOB: 11-13-1961   Admission date: 02/13/2022  PCP:  Harlan Stains, MD   [facility] Providers Cardiologist:  None        Chief Complaint:  palpitations   Patient Profile:   [name] is a 61 y.o. male with hypertension, CAD obesity, longstanding persistent atrial fibrillation/atypical atrial flutter status post multiple ablations including recent convergent procedure on 11/2021 who is being seen 02/13/2022 for the evaluation of atrial fibrillation.  History of Present Illness:   [name] is a 61 year old male with hypertension, CAD, obesity, and longstanding persistent atrial fibrillation/atypical atrial flutter who recently underwent convergent procedure at [facility].  After the procedure, he was for the first time in a long time in normal sinus rhythm.  Notes that he was there for quite a while.  He had a sick contact a few days ago and has been noting some nasal congestion and mild chest tightness when taking deep breaths.  Feels as though he has to cough up phlegm or has bronchitis.  Notes that he is able to feeling went into atrial fibrillation.  Felt that his shortness of breath is a little bit worse however on presentation to emergency department he notes he was no longer able to feel atrial fibrillation.  He was initially started on a diltiazem drip which helped rate control him, however he noted that he was having a significant reaction to the medication.  He also has a history of reaction to metoprolol.  He has been on amiodarone since his convergent procedure, so decision was made to reload him with amiodarone to see if can be converted back to sinus rhythm.  He is otherwise stable at this time.  Denying any other symptoms besides congestion, chest tightness with breathing.   Past Medical History:  Diagnosis Date   Arthritis    Atrial flutter ([place])    diagnosed 2005    Depression    Diverticular disease    GERD (gastroesophageal reflux disease)    Obesity    Paroxysmal atrial fibrillation (HCC)    Primary localized osteoarthritis of left knee     Past Surgical History:  Procedure Laterality Date   ATRIAL FIBRILLATION ABLATION N/A 01/24/2019   Procedure: ATRIAL FIBRILLATION ABLATION;  Surgeon: Thompson Grayer, MD;  Location: [place] CV LAB;  Service: Cardiovascular;  Laterality: N/A;   ATRIAL FIBRILLATION ABLATION N/A 04/14/2020   Procedure: ATRIAL FIBRILLATION ABLATION;  Surgeon: [place] Grayer, MD;  Location: [place] CV [facility];  Service: Cardiovascular;  Laterality: N/A;   CARDIOVERSION N/A 12/06/2018   Procedure: CARDIOVERSION;  Surgeon: Elouise Munroe, MD;  Location: [facility] ENDOSCOPY;  Service: Cardiovascular;  Laterality: N/A;   CARDIOVERSION N/A 03/13/2019   Procedure: CARDIOVERSION;  Surgeon: Josue Hector, MD;  Location: [facility] ENDOSCOPY;  Service: Cardiovascular;  Laterality: N/A;   CARDIOVERSION N/A 03/05/2020   Procedure: CARDIOVERSION;  Surgeon: Geralynn Rile, MD;  Location: [place];  Service: Cardiovascular;  Laterality: N/A;   COLON SURGERY  12/07, 5/08, 10/08   COLONOSCOPY     COLOSTOMY  2007   COLOSTOMY REVERSAL     EYE SURGERY Bilateral    cataracts   INCISIONAL HERNIA REPAIR  2008   with mesh   KNEE ARTHROSCOPY Left 5/10   x2   LEFT HEART CATH AND CORONARY ANGIOGRAPHY N/A 10/01/2021   Procedure: LEFT HEART CATH AND CORONARY ANGIOGRAPHY;  Surgeon: Belva Crome, MD;  Location: [place]  CV LAB;  Service: Cardiovascular;  Laterality: N/A;   MULTIPLE TOOTH EXTRACTIONS     neck fusion  5/11   REPLANTATION THUMB Right    TONSILLECTOMY     TOTAL KNEE ARTHROPLASTY Left 08/23/2017   TOTAL KNEE ARTHROPLASTY Left 08/23/2017   Procedure: LEFT TOTAL KNEE ARTHROPLASTY;  Surgeon: Salvatore Marvel, MD;  Location: [facility] OR;  Service: Orthopedics;  Laterality: Left;   WISDOM TOOTH EXTRACTION       Medications Prior to  Admission: Prior to Admission medications   Medication Sig Start Date End Date Taking? Authorizing Provider  acetaminophen (TYLENOL) 650 MG CR tablet Take 1,300 mg by mouth 2 (two) times daily.   Yes [provider]  amiodarone (PACERONE) 200 MG tablet Take 200 mg by mouth daily.   Yes [provider]  apixaban (ELIQUIS) 5 MG TABS tablet Take 1 tablet (5 mg total) by mouth 2 (two) times daily. 12/21/21  Yes Allred, Fayrene Fearing, MD  atenolol (TENORMIN) 50 MG tablet Take 1 tablet (50 mg total) by mouth 2 (two) times daily. 08/17/21  Yes Allred, Fayrene Fearing, MD  diclofenac (VOLTAREN) 50 MG EC tablet Take 50 mg by mouth daily.    Yes [provider]  diclofenac Sodium (VOLTAREN) 1 % GEL Apply 1 application topically 4 (four) times daily as needed (pain).   Yes [provider]  diphenhydrAMINE (BENADRYL) 25 mg capsule Take 25 mg by mouth 2 (two) times daily as needed (back pain).   Yes [provider]  Docusate Sodium (COLACE PO) Take 1 tablet by mouth daily.   Yes [provider]  gabapentin (NEURONTIN) 100 MG capsule Take 100 mg by mouth 3 (three) times daily as needed (pain).   Yes [provider]  guaiFENesin (MUCINEX) 600 MG 12 hr tablet Take 600 mg by mouth 2 (two) times daily as needed for cough.   Yes [provider]  methocarbamol (ROBAXIN) 500 MG tablet Take 500 mg by mouth every 8 (eight) hours as needed for muscle spasms.    Yes [provider]  Multiple Vitamin (MULTIVITAMIN WITH MINERALS) TABS tablet Take 1 tablet by mouth daily.   Yes [provider]  oxyCODONE-acetaminophen (PERCOCET/ROXICET) 5-325 MG tablet Take 1 tablet by mouth 2 (two) times daily. 05/06/20  Yes [provider]  sildenafil (REVATIO) 20 MG tablet Take 20 mg by mouth daily as needed (ED). 04/24/20  Yes [provider]  traMADol (ULTRAM) 50 MG tablet Take 100 mg by mouth 3 (three) times daily. 04/12/20  Yes [provider]   TURMERIC-GINGER PO Take 1 tablet by mouth daily.   Yes [provider]  diltiazem (CARDIZEM) 120 MG tablet TAKE 1 TABLET BY MOUTH THREE TIMES DAILY Patient not taking: Reported on 01/18/2022 10/07/21   Hillis Range, MD     Allergies:    Allergies  Allergen Reactions   Erythromycin Other (See Comments)    Abdominal cramps    Hydrocodone-Acetaminophen Other (See Comments)    Makes him feel "high"   Mobic [Meloxicam] Other (See Comments)    Lower back and kidney pain    Prednisone Hives, Anxiety and Other (See Comments)    Makes him anxious   Z-Pak [Azithromycin] Nausea Only and Other (See Comments)    Severe stomach cramps    Cefuroxime     worsening back pain?   Celecoxib     myalgias   Duloxetine Hcl     spacy   Lopressor [Metoprolol Tartrate] Swelling   Metaxalone  Unknown   Amoxicillin Palpitations   Dilaudid [Hydromorphone] Palpitations   Penicillins Rash and Other (See Comments)    Has patient had a PCN reaction causing immediate rash, facial/tongue/throat swelling, SOB or lightheadedness with hypotension: No Has patient had a PCN reaction causing severe rash involving mucus membranes or skin necrosis: No Has patient had a PCN reaction that required hospitalization: No Has patient had a PCN reaction occurring within the last 10 years: No If all of the above answers are "NO", then may proceed with Cephalosporin use.      Social History:   Social History   Socioeconomic History   Marital status: Married    Spouse name: Not on file   Number of children: Not on file   Years of education: Not on file   Highest education level: Not on file  Occupational History   Not on file  Tobacco Use   Smoking status: Former    Years: 2.00    Types: Cigarettes    Quit date: 08/11/2012    Years since quitting: 9.5   Smokeless tobacco: Never  Vaping Use   Vaping Use: Never used  Substance and Sexual Activity   Alcohol use: Yes    Alcohol/week: 14.0 standard  drinks    Types: 14 Cans of beer per week    Comment: 2 beers/ day   Drug use: No   Sexual activity: Not on file    Comment: married ([name])  Other Topics Concern   Not on file  Social History Narrative   Not on file   Social Determinants of Health   Financial Resource Strain: Not on file  Food Insecurity: Not on file  Transportation Needs: Not on file  Physical Activity: Not on file  Stress: Not on file  Social Connections: Not on file  Intimate Partner Violence: Not on file    Family History:   The patient's family history includes CAD in his father and sister; Cancer in his paternal grandfather; Colon polyps in his father; Diabetes in his father; Hypertension in his father and mother.    ROS:  Please see the history of present illness.  All other ROS reviewed and negative.     Physical Exam/Data:   Vitals:   02/13/22 0130 02/13/22 0208 02/13/22 0323 02/13/22 0453  BP: 117/69 (!) 143/78 122/75 (!) 127/97  Pulse: (!) 136 (!) 124 91 (!) 122  Resp: (!) 22 18 16 20   Temp:      TempSrc:      SpO2: 95% 96% 97% 95%   No intake or output data in the 24 hours ending 02/13/22 0513 Last 3 Weights 10/01/2021 07/28/2021 06/01/2021  Weight (lbs) 240 lb 252 lb 250 lb 9.6 oz  Weight (kg) 108.863 kg 114.306 kg 113.671 kg     There is no height or weight on file to calculate BMI.  General:  Well nourished, well developed, in no acute distress HEENT: normal Neck: no JVD Vascular: No carotid bruits; Distal pulses 2+ bilaterally   Cardiac:  normal S1, S2; tachycardic, irregular; no murmur  Lungs:  clear to auscultation bilaterally, no wheezing, rhonchi or rales  Abd: soft, nontender, no hepatomegaly  Ext: no edema Musculoskeletal:  No deformities, BUE and BLE strength normal and equal Skin: warm and dry  Neuro:  CNs 2-12 intact, no focal abnormalities noted Psych:  Normal affect    EKG:  The ECG that was done  was personally reviewed and demonstrates atrial fibrillation with  RVR  Relevant CV  Studies: None  Laboratory Data:  High Sensitivity Troponin:   Recent Labs  Lab 02/13/22 0140 02/13/22 0404  TROPONINIHS 12 18*      Chemistry Recent Labs  Lab 02/13/22 0140  NA 137  K 4.2  CL 103  CO2 20*  GLUCOSE 110*  BUN 21  CREATININE 0.87  CALCIUM 9.5  MG 1.8  GFRNONAA >60  ANIONGAP 14    No results for input(s): PROT, ALBUMIN, AST, ALT, ALKPHOS, BILITOT in the last 168 hours. Lipids No results for input(s): CHOL, TRIG, HDL, LABVLDL, LDLCALC, CHOLHDL in the last 168 hours. Hematology Recent Labs  Lab 02/13/22 0140  WBC 14.0*  RBC 5.10  HGB 15.3  HCT 46.3  MCV 90.8  MCH 30.0  MCHC 33.0  RDW 14.4  PLT 204   Thyroid No results for input(s): TSH, FREET4 in the last 168 hours. BNPNo results for input(s): BNP, PROBNP in the last 168 hours.  DDimer No results for input(s): DDIMER in the last 168 hours.   Radiology/Studies:  DG Chest Port 1 View  Result Date: 02/13/2022 CLINICAL DATA:  Chest pain. EXAM: PORTABLE CHEST 1 VIEW COMPARISON:  Chest radiograph 12/07/2018 FINDINGS: Borderline cardiomegaly with unchanged mediastinal contours. Probable implanted loop recorder in the left chest wall, not seen on prior exam. Streaky bibasilar atelectasis without confluent consolidation. No pulmonary edema, pleural effusion, or pneumothorax. Surgical hardware in the lower cervical spine. IMPRESSION: Borderline cardiomegaly with streaky bibasilar atelectasis. Electronically Signed   By: Keith Rake M.D.   On: 02/13/2022 01:54     Assessment and Plan:   Atrial fibrillation/atypical atrial flutter -he had a recent successful convergent procedure that converted him to normal sinus rhythm.  I wonder if he has a upper respiratory infection that has triggered him to be back in atrial fibrillation.  Nasal swab pending.  Agree with continuing amiodarone load and given that he was in normal sinus rhythm after the procedure, would likely feel that he would  benefit from cardioversion attempt at normal sinus rhythm.  He remains on apixaban for anticoagulation and has not missed any doses. We will have EP see him. Will order repeat echo.  CAD - has known multivessel nonobstructive CAD. Does not appear to be driving any symptoms now.  HTN - continue atenolol for now    Risk Assessment/Risk Scores:         CHA2DS2-VASc Score =   2  This indicates a 2.2 % annual risk of stroke. The patient's score is based upon:        Severity of Illness: The appropriate patient status for this patient is OBSERVATION. Observation status is judged to be reasonable and necessary in order to provide the required intensity of service to ensure the patient's safety. The patient's presenting symptoms, physical exam findings, and initial radiographic and laboratory data in the context of their medical condition is felt to place them at decreased risk for further clinical deterioration. Furthermore, it is anticipated that the patient will be medically stable for discharge from the hospital within 2 midnights of admission.    For questions or updates, please contact [place] Please consult [email or web address] for contact info under     Signed, Doyne Keel, MD  02/13/2022 5:13 AM

## 2022-02-13 NOTE — ED Notes (Addendum)
Bethany RN notified this RN that pt called out stating he was having a reaction to the diltiazem drip. Pt stated "I can feel everything close, and my heart beating here in my neck." Bethany RN stopped the diltiazem drip and disconnected pt immediately. Pt O2 sat maintaining 96% on room air, HR 80, BP 143/78. Pt able to follow commands, no distress or SOB noted. Pt resting in bed with wife at bedside. Pt able to answer orientation questions. Horton MD aware.

## 2022-02-13 NOTE — ED Notes (Signed)
Secure chat sent to Dr. Wyline Mood regarding pt's request for home pain meds (Tylenol, voltaren gel, percocet, and tramadol.)

## 2022-02-13 NOTE — Telephone Encounter (Signed)
Received a page overnight stating -"patient having hard time tonight, having heart related issues, unsure if he is having a heart attack or not"  Attempted to call the number provided in the page (which is also his number in the chart, three times with no answer.   Gerre Scull, MD Cardiology

## 2022-02-13 NOTE — ED Notes (Signed)
Breakfast tray delivered to pt at this time.  

## 2022-02-13 NOTE — Discharge Summary (Addendum)
Discharge Summary    Patient ID: EL PILE MRN: [id number]; DOB: 1961/03/11  Admit date: 02/13/2022 Discharge date: 02/13/2022  PCP:  David Montana, MD   [facility] Providers Cardiologist:  Dr. [name] - now followed more recently at [facility] - EP Click here to update MD or APP on Care Team, Refresh:1}     Discharge Diagnoses    Principal Problem:   Atrial fibrillation [facility]) Active Problems:   CAD in native artery   Symptoms of upper respiratory infection (URI)   Essential hypertension    Diagnostic Studies/Procedures    N/A _____________   History of Present Illness     [name] is a 61 y.o. male with hypertension, CAD (diffuse 3v atherosclerosis without focal obstructive disease by cath 09/2021), mild LV dysfunction (EF 45% by cath 09/2021 but felt possibly normal by TEE 11/2021 at OSH), obesity, GERD, depression, longstanding persistent atrial fibrillation/atypical atrial flutter status post multiple ablations including recent convergent procedure on 11/2021 who presented to [facility] with recurrent atrial fibrillation.   [name] has a longstanding history of atrial arrhythmias and recently underwent convergent procedure at [facility] in 11/2021. After the procedure, he was for the first time in a long time in normal sinus rhythm. Notes that he was there for quite a while.  He had a sick contact a few days ago and has been noting some nasal congestion and mild chest tightness when taking deep breaths. He has felt as though he has to cough up phlegm. Yesterday after chopping wood and drinking a [facility], he felt he went out of rhythm with some shortness of breath. He reported compliance with medication and otherwise had not had any caffeine or alcohol outside of this ablation. He was initially started on diltiazem drip but felt poorly with this (stated "I can feel everything close, and my heart beating here in my neck", VSS at that time) so this was discontinued.   He also has a history of reaction to metoprolol. He takes atenolol at home which was continued. At time of cardiology consultation he was no longer able to feel his atrial fibrillation. Troponins were flat at 12-18, not felt to reflect ACS. WBC was elevated at 14.8. CXR showed borderline cardiomegaly with streaky bibasilar atelectasis. He has been on amiodarone since his convergent procedure, so decision was made to reload him with amiodarone to see if can be converted back to sinus rhythm.  Hospital Course     He was treated with IV amiodarone bolus and started on a drip and converted to NSR late this morning. Covid swab and RVP panel were negative. His URI symptoms actually improved after conversion to NSR so Dr. Izora Ribas did not feel any specific additional therapy was warranted for this. He recommended the patient could consider trial of oral antibiotics/PCP evaluation if he had worsening URI symptoms despite control of normal rhythm. The patient feels well this morning and is eager to go home. He has f/u with [facility] EP on Tuesday which he was advised to keep. Dr Izora Ribas recommended to discharge on amiodarone 400mg  BID. Dr. has seen and examined the patient today and feels he is stable for discharge. Discharge instructions outlined below.   Did the patient have an acute coronary syndrome (MI, NSTEMI, STEMI, etc) this admission?:  No                               Did  the patient have a percutaneous coronary intervention (stent / angioplasty)?:  No.      _____________  Discharge Vitals Blood pressure 107/68, pulse 65, temperature 98.4 F (36.9 C), temperature source Oral, resp. rate 16, SpO2 96 %.  There were no vitals filed for this visit.  Labs & Radiologic Studies    CBC Recent Labs    02/13/22 0140  WBC 14.0*  HGB 15.3  HCT 46.3  MCV 90.8  PLT 204   Basic Metabolic Panel Recent Labs    84/13/24 0140  NA 137  K 4.2  CL 103  CO2 20*  GLUCOSE 110*  BUN  21  CREATININE 0.87  CALCIUM 9.5  MG 1.8   High Sensitivity Troponin:   Recent Labs  Lab 02/13/22 0140 02/13/22 0404  TROPONINIHS 12 18*     _____________  DG Chest Port 1 View  Result Date: 02/13/2022 CLINICAL DATA:  Chest pain. EXAM: PORTABLE CHEST 1 VIEW COMPARISON:  Chest radiograph 12/07/2018 FINDINGS: Borderline cardiomegaly with unchanged mediastinal contours. Probable implanted loop recorder in the left chest wall, not seen on prior exam. Streaky bibasilar atelectasis without confluent consolidation. No pulmonary edema, pleural effusion, or pneumothorax. Surgical hardware in the lower cervical spine. IMPRESSION: Borderline cardiomegaly with streaky bibasilar atelectasis. Electronically Signed   By: Narda Rutherford M.D.   On: 02/13/2022 01:54   Disposition   Pt is being discharged home today in good condition.  Follow-up Plans & Appointments     Follow-up Information     [facility] EP Team Follow up.   Why: Keep follow-up as scheduled with EP team on Tuesday.        Hillis Range, MD Follow up.   Specialty: Cardiology Why: Please call Dr. Jenel Lucks office if you have any questions about your plan of care from this hospitalization. Contact information: [address] [place] [address] [phone]         David Montana, MD Follow up.   Specialty: Family Medicine Why: Dr. Izora Ribas has recommended that if you have worsening upper respiratory infection symptoms, please contact your primary care provider. Contact information: [address] [place] [address] [phone]                Discharge Instructions     Diet - low sodium heart healthy   Complete by: As directed    We recommend avoidance of caffeine and [facility].   Discharge instructions   Complete by: As directed    Please increase your amiodarone to 2 tablets twice daily for now and discuss further dosing regimen with the [facility] team at your follow-up  visit. If they plan to keep you at a higher dose, please request they send in an updated prescription to your pharmacy.  Diltiazem was removed from your medicine list as you indicated you were no longer taking this.  Your home medicine list included two medicines that both contain Tylenol - acetaminophen and oxycodone/acetaminophen. Just be aware of the content of acetaminophen when taking these medicines as you should not take more than  of acetaminophen every 6 hours, and no more than  total in a day.   You have 2 duplicate medications on your list in the same class - diclofenac tablets and diclofenac gel. Please confirm with the prescriber of this regimen that you are intended to be on both.   If you notice any bleeding such as blood in stool, black tarry stools, blood in urine, nosebleeds or  any other unusual bleeding, call your doctor immediately. It is not normal to have this kind of bleeding while on a blood thinner and usually indicates there is an underlying problem with one of your body systems that needs to be checked out.   Increase activity slowly   Complete by: As directed        Discharge Medications   Allergies as of 02/13/2022       Reactions   Erythromycin Other (See Comments)   Abdominal cramps   Hydrocodone-acetaminophen Other (See Comments)   Makes him feel "high"   Mobic [meloxicam] Other (See Comments)   Lower back and kidney pain    Prednisone Hives, Anxiety, Other (See Comments)   Makes him anxious   Z-pak [azithromycin] Nausea Only, Other (See Comments)   Severe stomach cramps   Cefuroxime    worsening back pain?   Celecoxib    myalgias   Duloxetine Hcl    spacy   Lopressor [metoprolol Tartrate] Swelling   Metaxalone    Unknown   Amoxicillin Palpitations   Dilaudid [hydromorphone] Palpitations   Penicillins Rash, Other (See Comments)   Has patient had a PCN reaction causing immediate rash, facial/tongue/throat swelling, SOB or  lightheadedness with hypotension: No Has patient had a PCN reaction causing severe rash involving mucus membranes or skin necrosis: No Has patient had a PCN reaction that required hospitalization: No Has patient had a PCN reaction occurring within the last 10 years: No If all of the above answers are "NO", then may proceed with Cephalosporin use.        Medication List     STOP taking these medications    diltiazem 120 MG tablet Commonly known as: CARDIZEM       TAKE these medications    acetaminophen 650 MG CR tablet Commonly known as: TYLENOL Take 1,300 mg by mouth 2 (two) times daily.   amiodarone 200 MG tablet Commonly known as: PACERONE Take 2 tablets (400 mg total) by mouth 2 (two) times daily. What changed:  how much to take when to take this   apixaban 5 MG Tabs tablet Commonly known as: Eliquis Take 1 tablet (5 mg total) by mouth 2 (two) times daily.   atenolol 50 MG tablet Commonly known as: TENORMIN Take 1 tablet (50 mg total) by mouth 2 (two) times daily.   COLACE PO Take 1 tablet by mouth daily.   diclofenac 50 MG EC tablet Commonly known as: VOLTAREN Take 50 mg by mouth daily.   diclofenac Sodium 1 % Gel Commonly known as: VOLTAREN Apply 1 application topically 4 (four) times daily as needed (pain).   diphenhydrAMINE 25 mg capsule Commonly known as: BENADRYL Take 25 mg by mouth 2 (two) times daily as needed (back pain).   gabapentin 100 MG capsule Commonly known as: NEURONTIN Take 100 mg by mouth 3 (three) times daily as needed (pain).   guaiFENesin 600 MG 12 hr tablet Commonly known as: MUCINEX Take 600 mg by mouth 2 (two) times daily as needed for cough.   methocarbamol 500 MG tablet Commonly known as: ROBAXIN Take 500 mg by mouth every 8 (eight) hours as needed for muscle spasms.   multivitamin with minerals Tabs tablet Take 1 tablet by mouth daily.   oxyCODONE-acetaminophen 5-325 MG tablet Commonly known as:  PERCOCET/ROXICET Take 1 tablet by mouth 2 (two) times daily.   sildenafil 20 MG tablet Commonly known as: REVATIO Take 20 mg by mouth daily as needed (ED).   traMADol  50 MG tablet Commonly known as: ULTRAM Take 100 mg by mouth 3 (three) times daily.   TURMERIC-GINGER PO Take 1 tablet by mouth daily.           Outstanding Labs/Studies   N/A  Duration of Discharge Encounter   Greater than 30 minutes including physician time.  Signed, David Montana, PA-C 02/13/2022, 1:03 PM   Personally seen and examined. Agree with APP above. Patient symptoms have resolved back in rhythm despite WBC elevation and atelectasis on imaging.  Nasal swab negative.  Has plans for PCP follow up; if worsening sx despite normal rhythm knows to reach out; course of PO ABX is reasonable  (he notes allergies to azithromycin.  Riley Lam, MD Cardiologist [facility]  [address] [place], [address] [place], [place] [address] [phone]  1:25 PM

## 2022-02-13 NOTE — ED Provider Notes (Signed)
[facility] EMERGENCY DEPARTMENT Provider Note   CSN: [id number] Arrival date & time: 02/13/22  0102     History  Chief Complaint  Patient presents with   Chest Pain    [name] is a 61 y.o. male.  HPI     This is a 61 year old male with a history of atrial fibrillation on Eliquis who presents with palpitations and chest discomfort.  Reports central burning chest discomfort that radiates into his neck.  Reports history of the same with his A-fib.  Has failed multiple cardioversions and ablations in the past.  He is currently on amiodarone, atenolol, and Eliquis for his atrial fibrillation.  Denies shortness of breath.  He has baseline back pain and states that he is supposed to have surgery but cannot get cleared by his cardiologist.  Patient also reports that he is very hot and having chills.  No noted fevers at home.  No upper respiratory symptoms or cough.  Chart reviewed.  He is followed by Dr. Rayann Heman as well as [facility].  Home Medications Prior to Admission medications   Medication Sig Start Date End Date Taking? Authorizing Provider  acetaminophen (TYLENOL) 650 MG CR tablet Take 1,300 mg by mouth 2 (two) times daily.   Yes [provider]  amiodarone (PACERONE) 200 MG tablet Take 200 mg by mouth daily.   Yes [provider]  apixaban (ELIQUIS) 5 MG TABS tablet Take 1 tablet (5 mg total) by mouth 2 (two) times daily. 12/21/21  Yes Allred, Jeneen Rinks, MD  atenolol (TENORMIN) 50 MG tablet Take 1 tablet (50 mg total) by mouth 2 (two) times daily. 08/17/21  Yes Allred, Jeneen Rinks, MD  diclofenac (VOLTAREN) 50 MG EC tablet Take 50 mg by mouth daily.    Yes [provider]  diclofenac Sodium (VOLTAREN) 1 % GEL Apply 1 application topically 4 (four) times daily as needed (pain).   Yes [provider]  diphenhydrAMINE (BENADRYL) 25 mg capsule Take 25 mg by mouth 2 (two) times daily as needed (back pain).   Yes [provider]   Docusate Sodium (COLACE PO) Take 1 tablet by mouth daily.   Yes [provider]  gabapentin (NEURONTIN) 100 MG capsule Take 100 mg by mouth 3 (three) times daily as needed (pain).   Yes [provider]  guaiFENesin (MUCINEX) 600 MG 12 hr tablet Take 600 mg by mouth 2 (two) times daily as needed for cough.   Yes [provider]  methocarbamol (ROBAXIN) 500 MG tablet Take 500 mg by mouth every 8 (eight) hours as needed for muscle spasms.    Yes [provider]  Multiple Vitamin (MULTIVITAMIN WITH MINERALS) TABS tablet Take 1 tablet by mouth daily.   Yes [provider]  oxyCODONE-acetaminophen (PERCOCET/ROXICET) 5-325 MG tablet Take 1 tablet by mouth 2 (two) times daily. 05/06/20  Yes [provider]  sildenafil (REVATIO) 20 MG tablet Take 20 mg by mouth daily as needed (ED). 04/24/20  Yes [provider]  traMADol (ULTRAM) 50 MG tablet Take 100 mg by mouth 3 (three) times daily. 04/12/20  Yes [provider]  TURMERIC-GINGER PO Take 1 tablet by mouth daily.   Yes [provider]  diltiazem (CARDIZEM) 120 MG tablet TAKE 1 TABLET BY MOUTH THREE TIMES DAILY Patient not taking: Reported on 01/18/2022 10/07/21   Thompson Grayer, MD      Allergies    Erythromycin, Hydrocodone-acetaminophen, Mobic [meloxicam], Prednisone, Z-pak [azithromycin], Cefuroxime, Celecoxib, Duloxetine hcl, Lopressor [metoprolol tartrate],  Metaxalone, Amoxicillin, Dilaudid [hydromorphone], and Penicillins    Review of Systems   Review of Systems  Constitutional:  Positive for chills. Negative for fever.  Respiratory:  Negative for cough and shortness of breath.   Cardiovascular:  Positive for chest pain. Negative for palpitations.  All other systems reviewed and are negative.  Physical Exam Updated Vital Signs BP 130/85    Pulse (!) 116    Temp 99.7 F (37.6 C) (Oral)    Resp 20    SpO2 95%  Physical Exam Vitals and nursing note reviewed.   Constitutional:      Appearance: He is well-developed.     Comments: Flushed, nontoxic-appearing  HENT:     Head: Normocephalic and atraumatic.  Eyes:     Pupils: Pupils are equal, round, and reactive to light.  Cardiovascular:     Rate and Rhythm: Tachycardia present. Rhythm irregular.     Heart sounds: Normal heart sounds. No murmur heard. Pulmonary:     Effort: Pulmonary effort is normal. No respiratory distress.     Breath sounds: Normal breath sounds. No wheezing.  Abdominal:     General: Bowel sounds are normal.     Palpations: Abdomen is soft.     Tenderness: There is no abdominal tenderness. There is no rebound.  Musculoskeletal:     Cervical back: Neck supple.     Right lower leg: No edema.     Left lower leg: No edema.  Lymphadenopathy:     Cervical: No cervical adenopathy.  Skin:    General: Skin is warm and dry.  Neurological:     Mental Status: He is alert and oriented to person, place, and time.  Psychiatric:     Comments: Anxious appearing    ED Results / Procedures / Treatments   Labs (all labs ordered are listed, but only abnormal results are displayed) Labs Reviewed  BASIC METABOLIC PANEL - Abnormal; Notable for the following components:      Result Value   CO2 20 (*)    Glucose, Bld 110 (*)    All other components within normal limits  CBC - Abnormal; Notable for the following components:   WBC 14.0 (*)    All other components within normal limits  TROPONIN I (HIGH SENSITIVITY) - Abnormal; Notable for the following components:   Troponin I (High Sensitivity) 18 (*)    All other components within normal limits  RESP PANEL BY RT-PCR (FLU A&B, COVID) ARPGX2  RESPIRATORY PANEL BY PCR  MAGNESIUM  TROPONIN I (HIGH SENSITIVITY)    EKG EKG Interpretation  Date/Time:  Sunday February 13 2022 01:11:43 EST Ventricular Rate:  130 PR Interval:    QRS Duration: 92 QT Interval:  311 QTC Calculation: 458 R Axis:   62 Text Interpretation: Atrial  fibrillation Ventricular premature complex Nonspecific repol abnormality, diffuse leads Baseline wander in lead(s) III aVF Confirmed by Thayer Jew [id number]) on 02/13/2022 2:55:28 AM  Radiology DG Chest Port 1 View  Result Date: 02/13/2022 CLINICAL DATA:  Chest pain. EXAM: PORTABLE CHEST 1 VIEW COMPARISON:  Chest radiograph 12/07/2018 FINDINGS: Borderline cardiomegaly with unchanged mediastinal contours. Probable implanted loop recorder in the left chest wall, not seen on prior exam. Streaky bibasilar atelectasis without confluent consolidation. No pulmonary edema, pleural effusion, or pneumothorax. Surgical hardware in the lower cervical spine. IMPRESSION: Borderline cardiomegaly with streaky bibasilar atelectasis. Electronically Signed   By: Keith Rake M.D.   On: 02/13/2022 01:54    Procedures .Critical Care Performed by: Thayer Jew  F, MD Authorized by: Merryl Hacker, MD   Critical care provider statement:    Critical care time (minutes):  60   Critical care was necessary to treat or prevent imminent or life-threatening deterioration of the following conditions:  Cardiac failure   Critical care was time spent personally by me on the following activities:  Development of treatment plan with patient or surrogate, discussions with consultants, evaluation of patient's response to treatment, examination of patient, ordering and review of laboratory studies, ordering and review of radiographic studies, ordering and performing treatments and interventions, pulse oximetry, re-evaluation of patient's condition and review of old charts    Medications Ordered in ED Medications  acetaminophen (TYLENOL) tablet 650 mg (has no administration in time range)  amiodarone (NEXTERONE) 1.8 mg/mL load via infusion 150 mg (150 mg Intravenous Bolus from Bag 02/13/22 0458)    Followed by  amiodarone (NEXTERONE PREMIX) 360-4.14 MG/200ML-% (1.8 mg/mL) IV infusion (60 mg/hr Intravenous New Bag/Given  02/13/22 0458)    Followed by  amiodarone (NEXTERONE PREMIX) 360-4.14 MG/200ML-% (1.8 mg/mL) IV infusion (has no administration in time range)  atenolol (TENORMIN) tablet 50 mg (has no administration in time range)  docusate sodium (COLACE) capsule 100 mg (has no administration in time range)  apixaban (ELIQUIS) tablet 5 mg (has no administration in time range)  ondansetron (ZOFRAN) injection 4 mg (has no administration in time range)  diltiazem (CARDIZEM) 1 mg/mL load via infusion 10 mg (10 mg Intravenous Bolus from Bag 02/13/22 0210)  oxyCODONE (Oxy IR/ROXICODONE) immediate release tablet 10 mg (10 mg Oral Given 02/13/22 0403)    ED Course/ Medical Decision Making/ A&P Clinical Course as of 02/13/22 [id number]  [facility] Feb 13, 2022  0440 Spoke with Dr. Marcelle Smiling, cardiology.  Given his multiple medication intolerances, he recommends loading with amiodarone. [CH]  [id number] Patient evaluated by cardiology.  Persistent A-fib despite amiodarone load and infusion.  Will be admitted to cardiology service for further management. [CH]    Clinical Course User Index [CH] Hadli Vandemark, Barbette Hair, MD                           Medical Decision Making Amount and/or Complexity of Data Reviewed Labs: ordered. Radiology: ordered.  Risk OTC drugs. Prescription drug management. Decision regarding hospitalization.   This patient presents to the ED for concern of chest discomfort, this involves an extensive number of treatment options, and is a complaint that carries with it a high risk of complications and morbidity.  The differential diagnosis includes arrhythmia, ACS, PE, infectious etiology  MDM:    This is a 61 year old male with difficult to treat atrial fibrillation who is failed multiple cardioversions and ablations who presents with chest discomfort and palpitations.  He is in A-fib with RVR.  He has been managed on amiodarone and atenolol.  He is otherwise nontoxic-appearing.  He does endorse chills.  He is  afebrile.  Labs obtained.  No significant metabolic derangements.  Initial troponin negative.  Repeat troponin borderline elevated at 18.  His EKG did not show any ischemic changes but did show persistent atrial fibrillation.  He was initially started on diltiazem and converted for short period of time.  He does not tolerate diltiazem and requested the infusion be stopped.  He subsequently went back into atrial fibrillation.  I engage cardiology who recommended amiodarone load and infusion.  This was ordered.  Chest x-ray shows no evidence of pneumothorax or pneumonia.  Magnesium is normal.  Other electrolytes are optimized.  We will plan for admission to the hospital service for ongoing atrial fibrillation (Labs, imaging)  Labs: I Ordered, and personally interpreted labs.  The pertinent results include: Slightly elevated troponin, normal metabolites  Imaging Studies ordered: I ordered imaging studies including chest x-ray without pneumothorax or pneumonia I independently visualized and interpreted imaging. I agree with the radiologist interpretation  Additional history obtained from wife.  External records from outside source obtained and reviewed including prior visits  Critical Interventions: IV diltiazem, IV amiodarone  Consultations: I requested consultation with the cardiology,  and discussed lab and imaging findings as well as pertinent plan - they recommend: Admission  Cardiac Monitoring: The patient was maintained on a cardiac monitor.  I personally viewed and interpreted the cardiac monitored which showed an underlying rhythm of: Atrial fibrillation with RVR  Reevaluation: After the interventions noted above, I reevaluated the patient and found that they have :stayed the same   Considered admission for: Persistent atrial fibrillation  Social Determinants of Health: Married, lives with wife  Disposition: Admit  Co morbidities that complicate the patient evaluation  Past  Medical History:  Diagnosis Date   Arthritis    Atrial flutter ([place])    diagnosed 2005   Depression    Diverticular disease    GERD (gastroesophageal reflux disease)    Obesity    Paroxysmal atrial fibrillation ([place])    Primary localized osteoarthritis of left knee      Medicines Meds ordered this encounter  Medications   acetaminophen (TYLENOL) tablet 650 mg   diltiazem (CARDIZEM) 1 mg/mL load via infusion 10 mg   DISCONTD: diltiazem (CARDIZEM) 125 mg in dextrose 5% 125 mL (1 mg/mL) infusion   oxyCODONE (Oxy IR/ROXICODONE) immediate release tablet 10 mg   FOLLOWED BY Linked Order Group    amiodarone (NEXTERONE) 1.8 mg/mL load via infusion 150 mg    amiodarone (NEXTERONE PREMIX) 360-4.14 MG/200ML-% (1.8 mg/mL) IV infusion    amiodarone (NEXTERONE PREMIX) 360-4.14 MG/200ML-% (1.8 mg/mL) IV infusion   atenolol (TENORMIN) tablet 50 mg   docusate sodium (COLACE) capsule 100 mg   apixaban (ELIQUIS) tablet 5 mg   ondansetron (ZOFRAN) injection 4 mg    I have reviewed the patients home medicines and have made adjustments as needed  Problem List / ED Course: Problem List Items Addressed This Visit       Cardiovascular and Mediastinum   Atrial fibrillation with RVR ([place]) - Primary   Relevant Medications   amiodarone (NEXTERONE PREMIX) 360-4.14 MG/200ML-% (1.8 mg/mL) IV infusion   amiodarone (NEXTERONE PREMIX) 360-4.14 MG/200ML-% (1.8 mg/mL) IV infusion (Start on 02/13/2022 10:45 AM)   atenolol (TENORMIN) tablet 50 mg (Start on 02/13/2022 10:00 AM)   apixaban (ELIQUIS) tablet 5 mg (Start on 02/13/2022 10:00 AM)                Final Clinical Impression(s) / ED Diagnoses Final diagnoses:  Atrial fibrillation with RVR ([place])    Rx / DC Orders ED Discharge Orders          Ordered    Amb referral to AFIB Clinic        02/13/22 0131              Merryl Hacker, MD 02/13/22 [phone]

## 2022-02-18 ENCOUNTER — Other Ambulatory Visit: Payer: Self-pay | Admitting: Family Medicine

## 2022-02-18 DIAGNOSIS — K769 Liver disease, unspecified: Secondary | ICD-10-CM

## 2022-02-28 ENCOUNTER — Ambulatory Visit
Admission: RE | Admit: 2022-02-28 | Discharge: 2022-02-28 | Disposition: A | Discharge status: Home / Self Care | Payer: 59 | Source: Ambulatory Visit | Attending: Family Medicine | Admitting: Family Medicine

## 2022-02-28 DIAGNOSIS — K769 Liver disease, unspecified: Secondary | ICD-10-CM

## 2022-05-06 ENCOUNTER — Other Ambulatory Visit: Payer: Self-pay | Admitting: Orthopedic Surgery

## 2022-05-06 DIAGNOSIS — M545 Low back pain, unspecified: Secondary | ICD-10-CM

## 2022-06-02 ENCOUNTER — Ambulatory Visit
Admission: RE | Admit: 2022-06-02 | Discharge: 2022-06-02 | Disposition: A | Discharge status: Home / Self Care | Payer: 59 | Source: Ambulatory Visit | Attending: Orthopedic Surgery | Admitting: Orthopedic Surgery

## 2022-06-02 DIAGNOSIS — M545 Low back pain, unspecified: Secondary | ICD-10-CM

## 2022-07-06 ENCOUNTER — Encounter: Payer: Self-pay | Admitting: Internal Medicine

## 2022-07-06 ENCOUNTER — Ambulatory Visit (INDEPENDENT_AMBULATORY_CARE_PROVIDER_SITE_OTHER): Payer: 59 | Admitting: Internal Medicine

## 2022-07-06 VITALS — BP 148/80 | HR 73 | Ht 69.0 in | Wt 249.0 lb

## 2022-07-06 DIAGNOSIS — I4819 Other persistent atrial fibrillation: Secondary | ICD-10-CM | POA: Diagnosis not present

## 2022-07-06 DIAGNOSIS — D6869 Other thrombophilia: Secondary | ICD-10-CM

## 2022-07-06 DIAGNOSIS — I484 Atypical atrial flutter: Secondary | ICD-10-CM | POA: Diagnosis not present

## 2022-07-06 DIAGNOSIS — I10 Essential (primary) hypertension: Secondary | ICD-10-CM | POA: Diagnosis not present

## 2022-07-06 NOTE — Progress Notes (Signed)
PCP: [name], MD   Primary EP: Dr [name]  [name] is a 61 y.o. male who presents today for routine electrophysiology followup.  Since last being seen in our clinic, the patient reports doing very well.  His primary concern is with back pain.  He had a hybrid ablation for refractory AF in December at [facility].  He has sone very well since.  AF is well controlled. Energy and SOB are much better off of amiodarone and with reduced atenolol. Today, he denies symptoms of palpitations, chest pain, shortness of breath,  lower extremity edema, dizziness, presyncope, or syncope.  The patient is otherwise without complaint today.   Past Medical History:  Diagnosis Date   Arthritis    Atrial flutter (HCC)    diagnosed 2005   Depression    Diverticular disease    GERD (gastroesophageal reflux disease)    Obesity    Paroxysmal atrial fibrillation (HCC)    Primary localized osteoarthritis of left knee    Past Surgical History:  Procedure Laterality Date   ATRIAL FIBRILLATION ABLATION N/A 01/24/2019   Procedure: ATRIAL FIBRILLATION ABLATION;  Surgeon: Hillis Range, MD;  Location: [facility] IN[facility] LAB;  Service: Cardiovascular;  Laterality: N/A;   ATRIAL FIBRILLATION ABLATION N/A 04/14/2020   Procedure: ATRIAL FIBRILLATION ABLATION;  Surgeon: Hillis Range, MD;  Location: [facility] CV LAB;  Service: Cardiovascular;  Laterality: N/A;   CARDIOVERSION N/A 12/06/2018   Procedure: CARDIOVERSION;  Surgeon: Parke Poisson, MD;  Location: [facility] ENDOSCOPY;  Service: Cardiovascular;  Laterality: N/A;   CARDIOVERSION N/A 03/13/2019   Procedure: CARDIOVERSION;  Surgeon: Wendall Stade, MD;  Location: [facility] ENDOSCOPY;  Service: Cardiovascular;  Laterality: N/A;   CARDIOVERSION N/A 03/05/2020   Procedure: CARDIOVERSION;  Surgeon: Sande Rives, MD;  Location: [facility] ENDOSCOPY;  Service: Cardiovascular;  Laterality: N/A;   COLON SURGERY  12/07, 5/08, 10/08   COLONOSCOPY     COLOSTOMY  2007    COLOSTOMY REVERSAL     EYE SURGERY Bilateral    cataracts   INCISIONAL HERNIA REPAIR  2008   with mesh   KNEE ARTHROSCOPY Left 5/10   x2   LEFT HEART CATH AND CORONARY ANGIOGRAPHY N/A 10/01/2021   Procedure: LEFT HEART CATH AND CORONARY ANGIOGRAPHY;  Surgeon: Lyn Records, MD;  Location: [facility] CV LAB;  Service: Cardiovascular;  Laterality: N/A;   MULTIPLE TOOTH EXTRACTIONS     neck fusion  5/11   REPLANTATION THUMB Right    TONSILLECTOMY     TOTAL KNEE ARTHROPLASTY Left 08/23/2017   TOTAL KNEE ARTHROPLASTY Left 08/23/2017   Procedure: LEFT TOTAL KNEE ARTHROPLASTY;  Surgeon: Salvatore Marvel, MD;  Location: [facility] OR;  Service: Orthopedics;  Laterality: Left;   WISDOM TOOTH EXTRACTION      ROS- all systems are reviewed and negatives except as per HPI above  Current Outpatient Medications  Medication Sig Dispense Refill   acetaminophen (TYLENOL) 650 MG CR tablet Take 1,300 mg by mouth 2 (two) times daily.     apixaban (ELIQUIS) 5 MG TABS tablet Take 1 tablet (5 mg total) by mouth 2 (two) times daily. 180 tablet 1   atenolol (TENORMIN) 50 MG tablet Take 1 tablet (50 mg total) by mouth 2 (two) times daily. 60 tablet 11   diclofenac (VOLTAREN) 50 MG EC tablet Take 50 mg by mouth daily.      diclofenac Sodium (VOLTAREN) 1 % GEL Apply 1 application topically 4 (four) times daily as needed (pain).  Docusate Sodium (COLACE PO) Take 1 tablet by mouth daily.     gabapentin (NEURONTIN) 100 MG capsule Take 100 mg by mouth 3 (three) times daily as needed (pain).     methocarbamol (ROBAXIN) 500 MG tablet Take 500 mg by mouth every 8 (eight) hours as needed for muscle spasms.      Multiple Vitamin (MULTIVITAMIN WITH MINERALS) TABS tablet Take 1 tablet by mouth daily.     oxyCODONE-acetaminophen (PERCOCET/ROXICET) 5-325 MG tablet Take 1 tablet by mouth 2 (two) times daily.     sildenafil (REVATIO) 20 MG tablet Take 20 mg by mouth daily as needed (ED).     traMADol (ULTRAM) 50 MG tablet Take 100  mg by mouth 3 (three) times daily.     No current facility-administered medications for this visit.    Physical Exam: Vitals:   07/06/22 0903  BP: (!) 148/80  Pulse: 73  SpO2: 97%  Weight: 249 lb (112.9 kg)  Height: 5\' 9"  (1.753 m)    GEN- The patient is well appearing, alert and oriented x 3 today.   Head- normocephalic, atraumatic Eyes-  Sclera clear, conjunctiva pink Ears- hearing intact Oropharynx- clear Lungs- Clear to ausculation bilaterally, normal work of breathing Heart- Regular rate and rhythm, no murmurs, rubs or gallops, PMI not laterally displaced GI- soft, NT, ND, + BS Extremities- no clubbing, cyanosis, or edema  Wt Readings from Last 3 Encounters:  07/06/22 249 lb (112.9 kg)  10/01/21 240 lb (108.9 kg)  07/28/21 252 lb (114.3 kg)    EKG tracing ordered today is personally reviewed and shows sinus rhythm with PACs  ILR shows AF burden <0.1%.  frequent PACs, short Atach and disorganized atrial activity are noted at times.  No sustained afib since February.  Assessment and Plan:  Persistent afib/ atypical atrial  flutter Doing well post hybrid ablation at [facility] 11/25/21.  He had hybrid RF ablation including ablation of counterclockwise mitral annulus dependent atrial flutter with anterior mitral linear ablation, left atrial roof line, and low posterior line. Now off of amiodarone Chads2vasc score is 1.  We could consider stopping eliquis on return reduce atenolol to 50mg  daily on return  2. HTN Stable No change required today  3. Obesity Body mass index is 36.77 kg/m. Lifestyle modification advised  4. CAD Nonobstructive by cath 09/2021  5. Preop Ok to proceed with back surgery if medically necessary. Ok to hold eliquis perioperatively  Risks, benefits and potential toxicities for medications prescribed and/or refilled reviewed with patient today.   Return to see AF clinic team in 3 months  MD, [facility] 07/06/2022 9:17 AM

## 2022-07-06 NOTE — Patient Instructions (Addendum)
Medication Instructions:  Your physician recommends that you continue on your current medications as directed. Please refer to the Current Medication list given to you today. *If you need a refill on your cardiac medications before your next appointment, please call your pharmacy*  Lab Work: None ordered. If you have labs (blood work) drawn today and your tests are completely normal, you will receive your results only by: MyChart Message (if you have MyChart) OR A paper copy in the mail If you have any lab test that is abnormal or we need to change your treatment, we will call you to review the results.  Testing/Procedures: None ordered.  Follow-Up: At [facility], you and your health needs are our priority.  As part of our continuing mission to provide you with exceptional heart care, we have created designated Provider Care Teams.  These Care Teams include your primary Cardiologist (physician) and Advanced Practice Providers (APPs -  Physician Assistants and Nurse Practitioners) who all work together to provide you with the care you need, when you need it.  Your next appointment:   Your physician wants you to follow-up in: 3 months with Atrial Fib clinic You may see Dr. [name] or one of the following Advanced Practice Providers on your designated Care Team:   Francis Dowse, [place] Casimiro Needle "Mardelle Matte" [place], [place] You will receive a reminder letter in the mail two months in advance. If you don't receive a letter, please call our office to schedule the follow-up appointment.   Important Information About Sugar

## 2022-07-07 ENCOUNTER — Other Ambulatory Visit: Payer: Self-pay | Admitting: Internal Medicine

## 2022-07-07 DIAGNOSIS — I4811 Longstanding persistent atrial fibrillation: Secondary | ICD-10-CM

## 2022-07-07 NOTE — Telephone Encounter (Signed)
Prescription refill request for Eliquis received.  Indication: afib  Last office visit: allred, 07/06/2022 Scr: 0.87, 02/13/2022 Age: 61  Weight: 112.9 kg   Refill sent.

## 2022-07-27 ENCOUNTER — Telehealth: Payer: Self-pay | Admitting: *Deleted

## 2022-07-27 NOTE — Telephone Encounter (Signed)
   Patient Name: [name]  DOB: 10/29/1961 MRN: [id number]  Primary Cardiologist: None  Chart reviewed as part of pre-operative protocol coverage. Pre-op clearance already addressed by colleagues in earlier phone notes. To summarize recommendations:  -Per Dr. Jenel Lucks note 07/06/22 Ok to proceed with back surgery if medically necessary. Ok to hold eliquis perioperatively.   Will route this bundled recommendation to requesting provider via Epic fax function and remove from pre-op pool. Please call with questions.  Sharlene Dory, PA-C 07/27/2022, 11:29 AM

## 2022-07-27 NOTE — Telephone Encounter (Signed)
   Pre-operative Risk Assessment    Patient Name: [name]  DOB: September 23, 1961 MRN: [id number]      Request for Surgical Clearance    Procedure:   LEFT SIDED SACROILIAC JOINT FUSION  Date of Surgery:  Clearance 08/02/22                                 Surgeon:  Loraine Leriche DUMONSKI  Surgeon's Group or Practice Name:  [facility] Phone number:  [phone] Fax number:  [phone] ATTN:  DONNA MCCAIN   Type of Clearance Requested:   - Pharmacy:  Hold Apixaban (Eliquis) NOT INDICATED   Type of Anesthesia:  Not Indicated   Additional requests/questions:    Wilhemina Cash   07/27/2022, 10:32 AM

## 2022-08-02 ENCOUNTER — Ambulatory Visit: Admit: 2022-08-02 | Payer: 59 | Admitting: Orthopedic Surgery

## 2022-08-02 SURGERY — SACROILIAC JOINT FUSION
Anesthesia: General

## 2022-08-06 IMAGING — US US ABDOMEN LIMITED
1 series · 14 of 25 positions shown · non-contrast
Comparison: Abdominal ultrasound 07/23/2021

CLINICAL DATA: Liver lesion

EXAM:
ULTRASOUND ABDOMEN LIMITED RIGHT UPPER QUADRANT

[Series 1: us abdomen limited · 0.22mm/px · 14 of 81 slices shown]
[im 1/81]
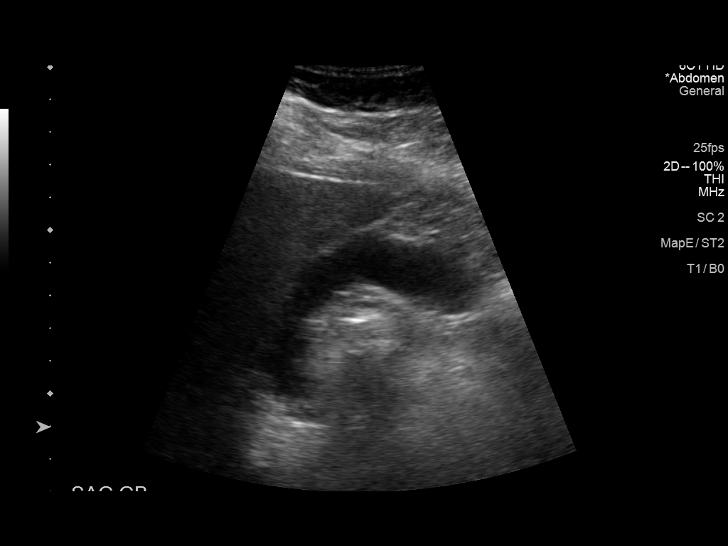
[im 7/81]
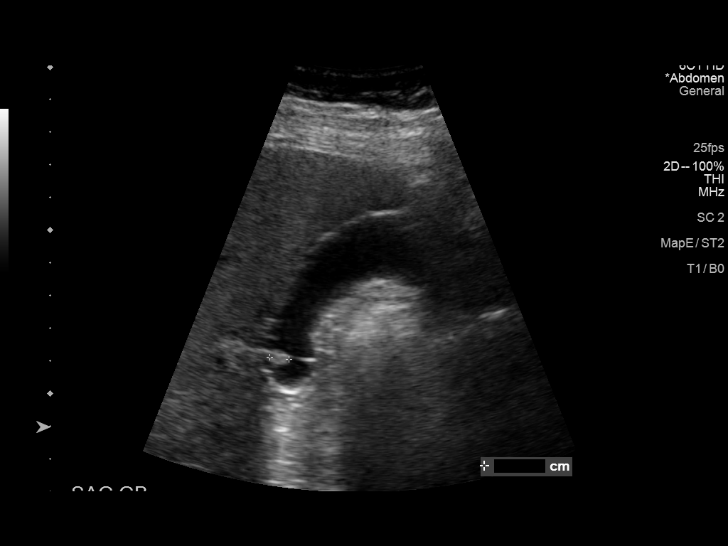
[im 14/81]
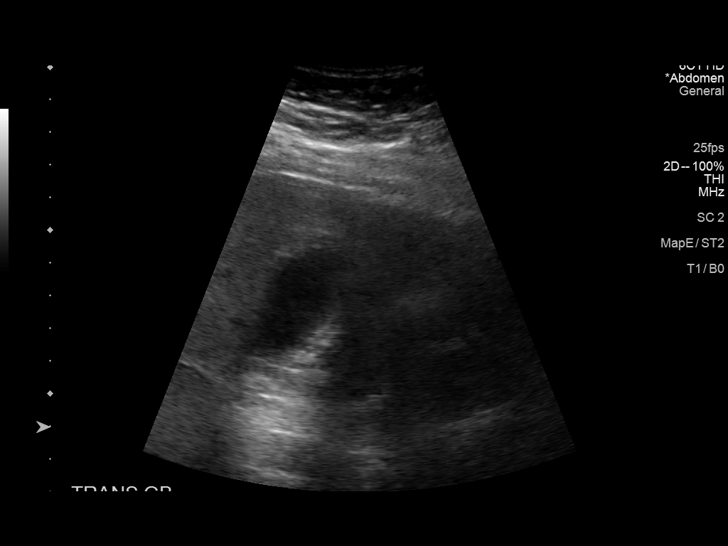
[im 21/81]
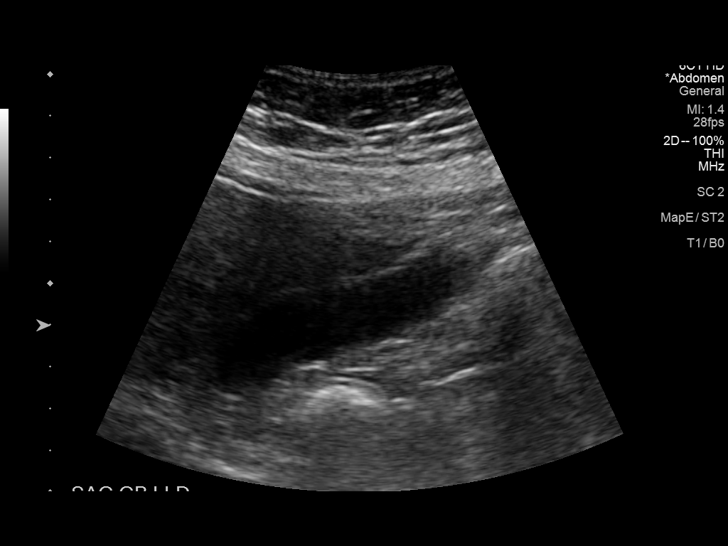
[im 27/81]
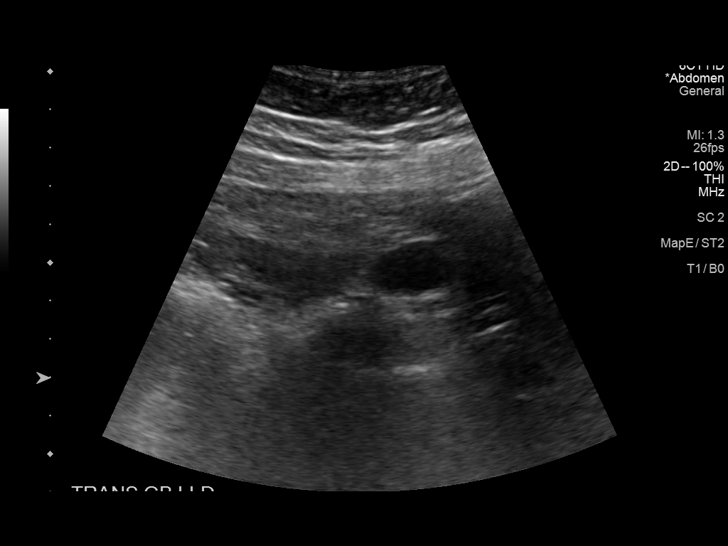
[im 31/81]
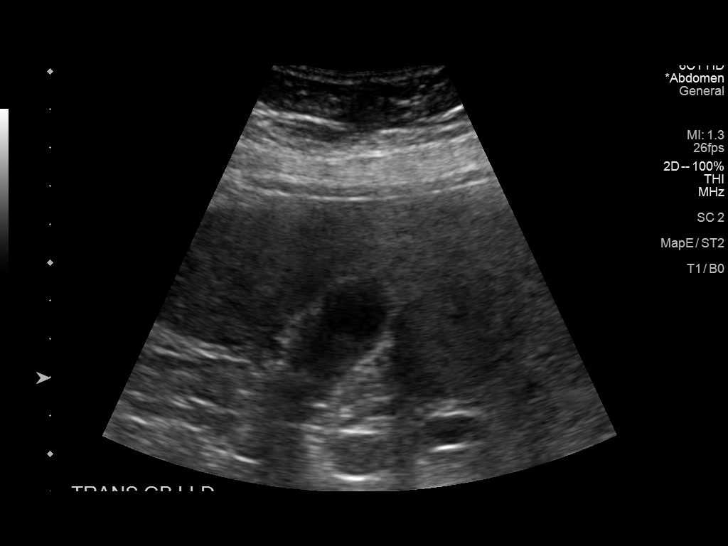
[im 37/81]
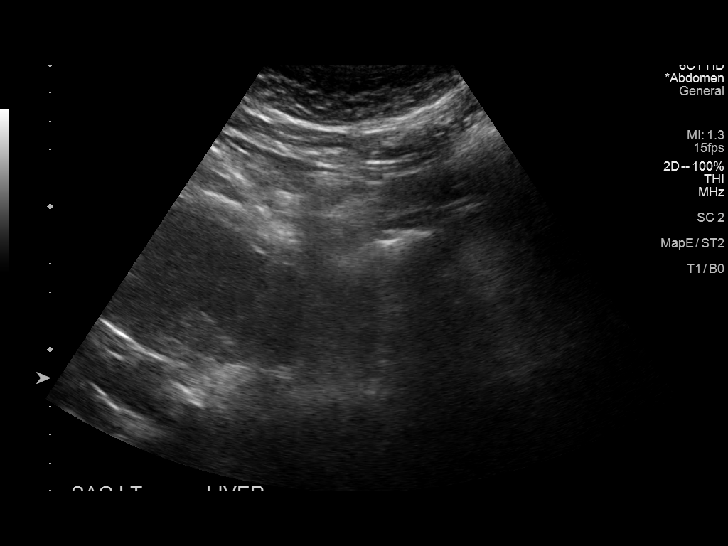
[im 44/81]
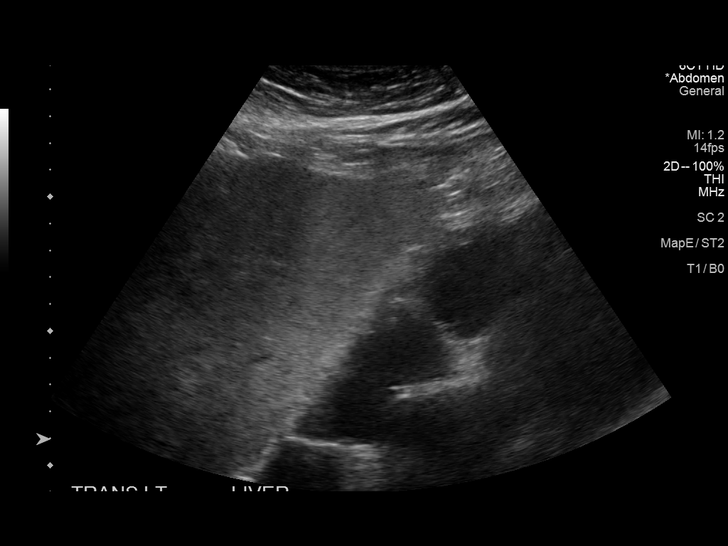
[im 51/81]
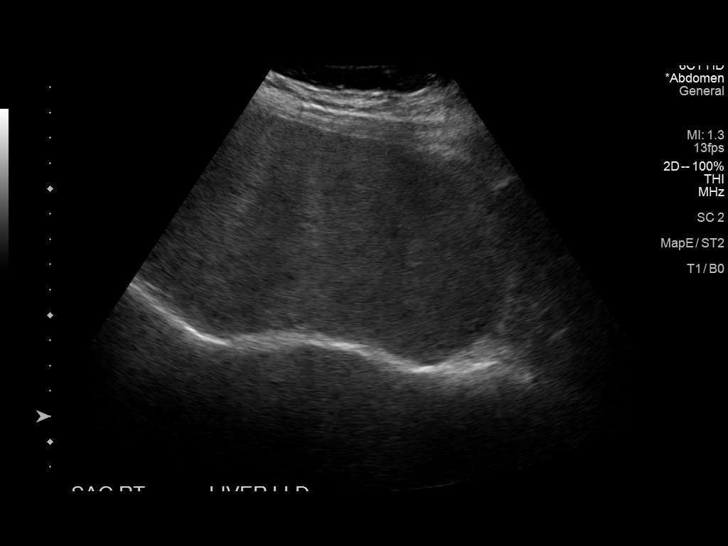
[im 54/81]
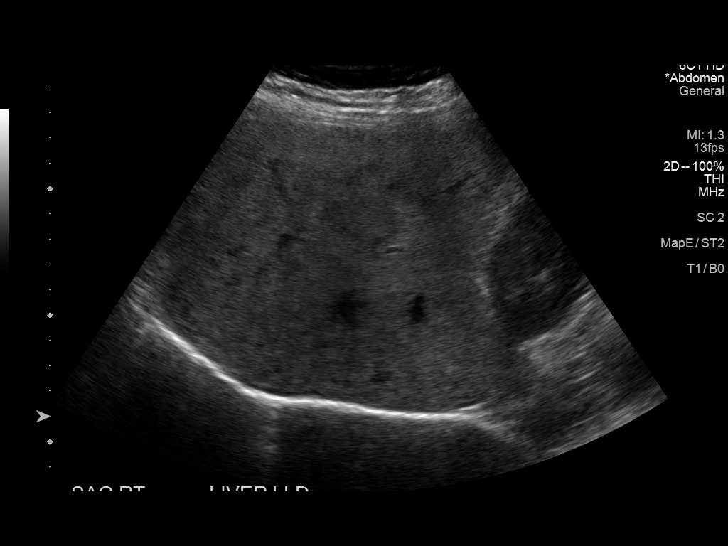
[im 61/81]
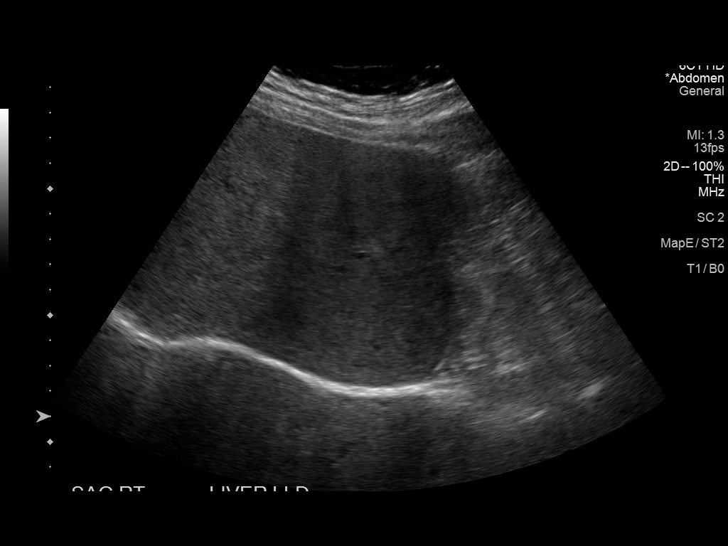
[im 67/81]
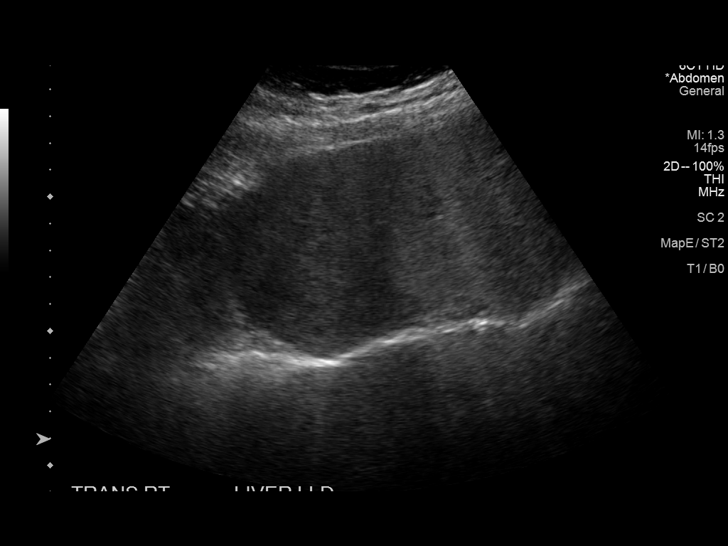
[im 74/81]
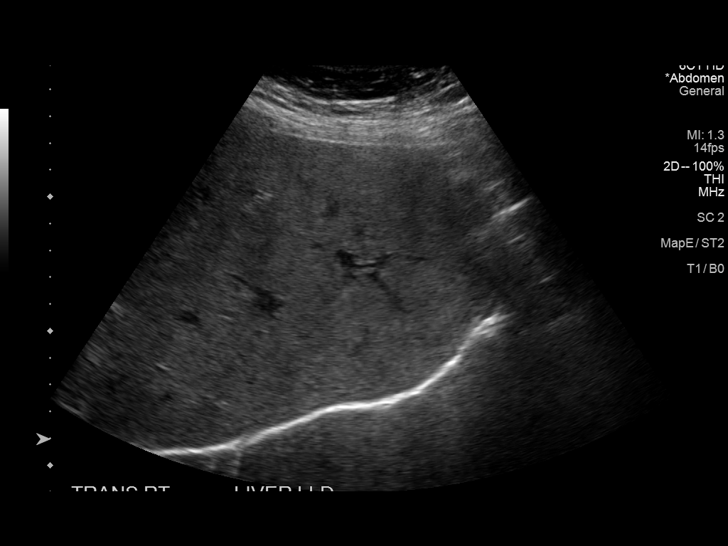
[im 81/81]
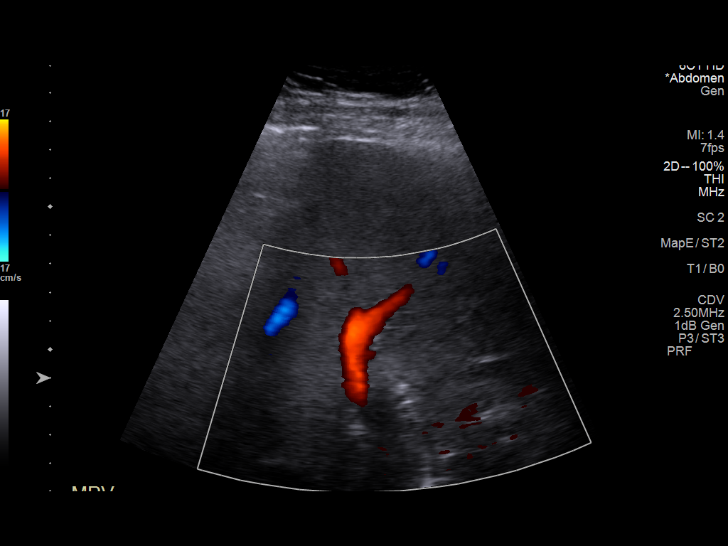

[14 of 25 positions shown; findings below may reference images not displayed]

FINDINGS: Gallbladder:

A small gallstone measuring 0.6 cm is again seen. There is no
gallbladder wall thickening or pericholecystic fluid. Sonographic
Murphy's sign was reported negative.

Common bile duct:

Diameter: 4 mm

Liver:

Background parenchymal echogenicity is mildly increased likely
reflecting fatty infiltration. A 6 mm x 4 mm x 4 mm anechoic lesion
is again seen in the left hepatic lobe, unchanged. There are no
other focal lesions. Portal vein is patent on color Doppler imaging
with normal direction of blood flow towards the liver.

Other: None.
IMPRESSION: 1. Subcentimeter anechoic lesion in the left hepatic lobe is
unchanged, likely reflecting a cyst.
2. Cholelithiasis.
3. Hepatic steatosis.

## 2022-09-26 ENCOUNTER — Encounter (HOSPITAL_COMMUNITY): Payer: Self-pay | Admitting: Physician Assistant

## 2022-09-26 ENCOUNTER — Ambulatory Visit (HOSPITAL_COMMUNITY)
Admission: RE | Admit: 2022-09-26 | Discharge: 2022-09-26 | Disposition: A | Discharge status: Home / Self Care | Payer: 59 | Source: Ambulatory Visit | Attending: Physician Assistant | Admitting: Physician Assistant

## 2022-09-26 VITALS — BP 136/82 | HR 68 | Ht 69.0 in | Wt 247.8 lb

## 2022-09-26 DIAGNOSIS — Z79899 Other long term (current) drug therapy: Secondary | ICD-10-CM | POA: Insufficient documentation

## 2022-09-26 DIAGNOSIS — I2510 Atherosclerotic heart disease of native coronary artery without angina pectoris: Secondary | ICD-10-CM | POA: Insufficient documentation

## 2022-09-26 DIAGNOSIS — I4811 Longstanding persistent atrial fibrillation: Secondary | ICD-10-CM | POA: Insufficient documentation

## 2022-09-26 DIAGNOSIS — I10 Essential (primary) hypertension: Secondary | ICD-10-CM | POA: Insufficient documentation

## 2022-09-26 DIAGNOSIS — I4892 Unspecified atrial flutter: Secondary | ICD-10-CM | POA: Diagnosis not present

## 2022-09-26 DIAGNOSIS — Z7901 Long term (current) use of anticoagulants: Secondary | ICD-10-CM | POA: Diagnosis not present

## 2022-09-26 MED ORDER — ATENOLOL 50 MG PO TABS
50.0000 mg | ORAL_TABLET | Freq: Two times a day (BID) | ORAL | 3 refills | Status: DC
Start: 2022-09-26 — End: 2022-09-26

## 2022-09-26 MED ORDER — ATENOLOL 50 MG PO TABS: 50.0000 mg | ORAL_TABLET | Freq: Two times a day (BID) | ORAL | 3 refills | Status: DC

## 2022-09-26 NOTE — Addendum Note (Signed)
Encounter addended by: Juluis Mire, RN on: 09/26/2022 10:36 AM  Actions taken: Order list changed

## 2022-09-26 NOTE — Addendum Note (Signed)
Encounter addended by: Juluis Mire, RN on: 09/26/2022 9:39 AM  Actions taken: Order list changed

## 2022-09-26 NOTE — Progress Notes (Signed)
Primary Care Physician: [name], MD Referring Physician: Dr. Vanessa Barbara is a 61 y.o. male with a h/o paroxysmal afib and flutter, s/p ablation x 2, last one in April 2021, on amiodarone , that is in the afib clinic, for f/u. He is in an atypical atrial flutter today. He does not feel any different in rhythm than out of rhythm.  He shows me his Lourena Simmonds which shows intermittent SR. He continues on eliquis for a CHA2DS2VASc score of 1. His labs TSH, cmet were normal checked last in June by PCP, reviewed on pt's phone.   F/u in afib clinic, 06/01/21, he appears to be in persistent  atrial flutter now. He has stopped amiodarone under the recent guidance of Dr. [name], with Lourena Simmonds and Zio patch showing persistent atrial  flutter and he has  decided to live in atrial flutter. He has noted some increase in BP and HR over the last week but this coincides with a cortisone shot for his chronic back pain 10 days ago. He is on max dose of Cardizem but is an intermittent acting  tablet and probably wears off after 8 hours use. We discussed changing over to long acting Cardizem to see if improvement and also how cortisone  can have short term consequences to HR and BP. He will need additional cortisone over time as surgery is not an option with his back issues.   Follow up in the AF clinic 09/26/22. Patient is s/p convergent with Dr Herbert Deaner at [facility] 11/2021. He reports that he has done very well since that time. His last ILR report showed 1.1% afib burden. Had back surgery and never resumed Eliquis.   Today, he denies symptoms of palpitations, chest pain, shortness of breath, orthopnea, PND, lower extremity edema, dizziness, presyncope, syncope, or neurologic sequela. The patient is tolerating medications without difficulties and is otherwise without complaint today.   Past Medical History:  Diagnosis Date   Arthritis    Atrial flutter (HCC)    diagnosed 2005   Depression    Diverticular disease     GERD (gastroesophageal reflux disease)    Obesity    Paroxysmal atrial fibrillation (HCC)    Primary localized osteoarthritis of left knee    Past Surgical History:  Procedure Laterality Date   ATRIAL FIBRILLATION ABLATION N/A 01/24/2019   Procedure: ATRIAL FIBRILLATION ABLATION;  Surgeon: Hillis Range, MD;  Location: [facility] LAB;  Service: Cardiovascular;  Laterality: N/A;   ATRIAL FIBRILLATION ABLATION N/A 04/14/2020   Procedure: ATRIAL FIBRILLATION ABLATION;  Surgeon: Hillis Range, MD;  Location: [facility] LAB;  Service: Cardiovascular;  Laterality: N/A;   CARDIOVERSION N/A 12/06/2018   Procedure: CARDIOVERSION;  Surgeon: Parke Poisson, MD;  Location: [facility] ENDOSCOPY;  Service: Cardiovascular;  Laterality: N/A;   CARDIOVERSION N/A 03/13/2019   Procedure: CARDIOVERSION;  Surgeon: Wendall Stade, MD;  Location: [facility] ENDOSCOPY;  Service: Cardiovascular;  Laterality: N/A;   CARDIOVERSION N/A 03/05/2020   Procedure: CARDIOVERSION;  Surgeon: Sande Rives, MD;  Location: [facility] ENDOSCOPY;  Service: Cardiovascular;  Laterality: N/A;   COLON SURGERY  12/07, 5/08, 10/08   COLONOSCOPY     COLOSTOMY  2007   COLOSTOMY REVERSAL     EYE SURGERY Bilateral    cataracts   INCISIONAL HERNIA REPAIR  2008   with mesh   KNEE ARTHROSCOPY Left 5/10   x2   LEFT HEART CATH AND CORONARY ANGIOGRAPHY N/A 10/01/2021   Procedure: LEFT HEART CATH AND  CORONARY ANGIOGRAPHY;  Surgeon: Belva Crome, MD;  Location: [place] CV LAB;  Service: Cardiovascular;  Laterality: N/A;   MULTIPLE TOOTH EXTRACTIONS     neck fusion  5/11   REPLANTATION THUMB Right    TONSILLECTOMY     TOTAL KNEE ARTHROPLASTY Left 08/23/2017   TOTAL KNEE ARTHROPLASTY Left 08/23/2017   Procedure: LEFT TOTAL KNEE ARTHROPLASTY;  Surgeon: Elsie Saas, MD;  Location: [place];  Service: Orthopedics;  Laterality: Left;   WISDOM TOOTH EXTRACTION      Current Outpatient Medications  Medication Sig Dispense Refill    acetaminophen (TYLENOL) 650 MG CR tablet Take 1,300 mg by mouth 2 (two) times daily.     apixaban (ELIQUIS) 5 MG TABS tablet Take 1 tablet by mouth twice daily 180 tablet 1   atenolol (TENORMIN) 50 MG tablet Take 1 tablet by mouth twice daily 180 tablet 3   diclofenac (VOLTAREN) 50 MG EC tablet Take 50 mg by mouth daily.      diclofenac Sodium (VOLTAREN) 1 % GEL Apply 1 application topically 4 (four) times daily as needed (pain).     Docusate Sodium (COLACE PO) Take 1 tablet by mouth daily.     gabapentin (NEURONTIN) 100 MG capsule Take 100 mg by mouth 3 (three) times daily as needed (pain).     methocarbamol (ROBAXIN) 500 MG tablet Take 500 mg by mouth every 8 (eight) hours as needed for muscle spasms.      Multiple Vitamin (MULTIVITAMIN WITH MINERALS) TABS tablet Take 1 tablet by mouth daily.     oxyCODONE-acetaminophen (PERCOCET/ROXICET) 5-325 MG tablet Take 1 tablet by mouth 2 (two) times daily.     sildenafil (REVATIO) 20 MG tablet Take 20 mg by mouth daily as needed (ED).     traMADol (ULTRAM) 50 MG tablet Take 100 mg by mouth 3 (three) times daily.     No current facility-administered medications for this visit.    Allergies  Allergen Reactions   Erythromycin Other (See Comments)    Abdominal cramps    Hydrocodone-Acetaminophen Other (See Comments)    Makes him feel "high"   Mobic [Meloxicam] Other (See Comments)    Lower back and kidney pain    Prednisone Hives, Anxiety and Other (See Comments)    Makes him anxious   Z-Pak [Azithromycin] Nausea Only and Other (See Comments)    Severe stomach cramps    Cefuroxime     worsening back pain?   Celecoxib     myalgias   Duloxetine Hcl     spacy   Lopressor [Metoprolol Tartrate] Swelling   Metaxalone     Unknown   Amoxicillin Palpitations   Dilaudid [Hydromorphone] Palpitations   Penicillins Rash and Other (See Comments)    Has patient had a PCN reaction causing immediate rash, facial/tongue/throat swelling, SOB or  lightheadedness with hypotension: No Has patient had a PCN reaction causing severe rash involving mucus membranes or skin necrosis: No Has patient had a PCN reaction that required hospitalization: No Has patient had a PCN reaction occurring within the last 10 years: No If all of the above answers are "NO", then may proceed with Cephalosporin use.      Social History   Socioeconomic History   Marital status: Married    Spouse name: Not on file   Number of children: Not on file   Years of education: Not on file   Highest education level: Not on file  Occupational History   Not on file  Tobacco  Use   Smoking status: Former    Years: 2.00    Types: Cigarettes    Quit date: 08/11/2012    Years since quitting: 10.1   Smokeless tobacco: Never  Vaping Use   Vaping Use: Never used  Substance and Sexual Activity   Alcohol use: Yes    Alcohol/week: 14.0 standard drinks of alcohol    Types: 14 Cans of beer per week    Comment: 2 beers/ day   Drug use: No   Sexual activity: Not on file    Comment: married ([name])  Other Topics Concern   Not on file  Social History Narrative   Not on file   Social Determinants of Health   Financial Resource Strain: Not on file  Food Insecurity: Not on file  Transportation Needs: Not on file  Physical Activity: Not on file  Stress: Not on file  Social Connections: Not on file  Intimate Partner Violence: Not on file    Family History  Problem Relation Age of Onset   Hypertension Mother    CAD Father    Hypertension Father    Diabetes Father    Colon polyps Father    CAD Sister    Cancer Paternal Grandfather        brain and lung    ROS- All systems are reviewed and negative except as per the HPI above  Physical Exam: There were no vitals filed for this visit.  Wt Readings from Last 3 Encounters:  07/06/22 112.9 kg  10/01/21 108.9 kg  07/28/21 114.3 kg    Labs: Lab Results  Component Value Date   NA 137 02/13/2022   K  4.2 02/13/2022   CL 103 02/13/2022   CO2 20 (L) 02/13/2022   GLUCOSE 110 (H) 02/13/2022   BUN 21 02/13/2022   CREATININE 0.87 02/13/2022   CALCIUM 9.5 02/13/2022   PHOS 3.5 11/01/2007   MG 1.8 02/13/2022   Lab Results  Component Value Date   INR 0.95 11/18/2014   Lab Results  Component Value Date   CHOL  10/29/2007    100        ATP III CLASSIFICATION:  <200     mg/dL   Desirable  725-366  mg/dL   Borderline High  >=440    mg/dL   High   TRIG 347 42/59/5638    GEN- The patient is a well appearing male, alert and oriented x 3 today.   HEENT-head normocephalic, atraumatic, sclera clear, conjunctiva pink, hearing intact, trachea midline. Lungs- Clear to ausculation bilaterally, normal work of breathing Heart- Regular rate and rhythm, no murmurs, rubs or gallops  GI- soft, NT, ND, + BS Extremities- no clubbing, cyanosis, or edema MS- no significant deformity or atrophy Skin- no rash or lesion Psych- euthymic mood, full affect Neuro- strength and sensation are intact   EKG- SR, PAC Vent. rate 68 BPM PR interval 158 ms QRS duration 90 ms QT/QTcB 388/412 ms   Assessment and Plan: 1. Longstanding persistent atrial fib / atrial flutter  S/p ablation 12/2018 and 03/2019 S/p convergent 11/25/21 with Dr Herbert Deaner ILR shows 1.1% afib burden.  We discussed the indication, risks, and benefits of anticoagulation. He did have nonobstructive CAD on cath which would make his CV score 2. He is clear in his decision to remain off anticoagulation at this time. If his afib burden increases on his ILR then he would consider resuming.  Continue atenolol 50 mg BID  2. CHA2DS2VASc score of 2 Continue  eliquis 5 mg bid   3. HTN Stable, no changes today.  4. CAD Nonobstructive on LHC 09/2021   Follow up in the AF clinic in 6 months.    [name] PA-C Afib Clinic [facility] [address] [place], [place] [address] [phone]

## 2023-02-02 ENCOUNTER — Encounter (HOSPITAL_COMMUNITY): Payer: Self-pay | Admitting: *Deleted

## 2023-06-16 ENCOUNTER — Other Ambulatory Visit: Payer: Self-pay

## 2023-06-16 MED ORDER — ATENOLOL 50 MG PO TABS: 50.0000 mg | ORAL_TABLET | Freq: Two times a day (BID) | ORAL | 0 refills | Status: DC

## 2023-06-16 NOTE — Telephone Encounter (Signed)
This is a A-Fib clinic pt 

## 2023-06-20 ENCOUNTER — Other Ambulatory Visit: Payer: Self-pay | Admitting: *Deleted

## 2023-06-20 MED ORDER — ATENOLOL 50 MG PO TABS: 50.0000 mg | ORAL_TABLET | Freq: Two times a day (BID) | ORAL | 0 refills | Status: DC

## 2023-12-02 ENCOUNTER — Other Ambulatory Visit: Payer: Self-pay | Admitting: Physician Assistant

## 2024-01-09 ENCOUNTER — Other Ambulatory Visit: Payer: Self-pay | Admitting: Physician Assistant

## 2024-01-11 ENCOUNTER — Other Ambulatory Visit (HOSPITAL_COMMUNITY): Payer: Self-pay | Admitting: *Deleted

## 2024-01-11 MED ORDER — ATENOLOL 50 MG PO TABS: 50.0000 mg | ORAL_TABLET | Freq: Two times a day (BID) | ORAL | 0 refills | Status: DC

## 2024-01-24 ENCOUNTER — Ambulatory Visit (HOSPITAL_COMMUNITY)
Admission: RE | Admit: 2024-01-24 | Discharge: 2024-01-24 | Disposition: A | Discharge status: Home / Self Care | Payer: 59 | Source: Ambulatory Visit | Attending: Physician Assistant | Admitting: Physician Assistant

## 2024-01-24 ENCOUNTER — Encounter (HOSPITAL_COMMUNITY): Payer: Self-pay | Admitting: Physician Assistant

## 2024-01-24 VITALS — BP 118/80 | HR 79 | Ht 69.0 in | Wt 240.8 lb

## 2024-01-24 DIAGNOSIS — I4892 Unspecified atrial flutter: Secondary | ICD-10-CM | POA: Diagnosis not present

## 2024-01-24 DIAGNOSIS — I4811 Longstanding persistent atrial fibrillation: Secondary | ICD-10-CM | POA: Diagnosis not present

## 2024-01-24 DIAGNOSIS — Z7901 Long term (current) use of anticoagulants: Secondary | ICD-10-CM | POA: Insufficient documentation

## 2024-01-24 DIAGNOSIS — I10 Essential (primary) hypertension: Secondary | ICD-10-CM | POA: Diagnosis not present

## 2024-01-24 DIAGNOSIS — I2510 Atherosclerotic heart disease of native coronary artery without angina pectoris: Secondary | ICD-10-CM | POA: Diagnosis not present

## 2024-01-24 DIAGNOSIS — Z79899 Other long term (current) drug therapy: Secondary | ICD-10-CM | POA: Insufficient documentation

## 2024-01-24 DIAGNOSIS — D6869 Other thrombophilia: Secondary | ICD-10-CM | POA: Insufficient documentation

## 2024-01-24 MED ORDER — ATENOLOL 50 MG PO TABS: 50.0000 mg | ORAL_TABLET | Freq: Two times a day (BID) | ORAL | 3 refills | Status: DC

## 2024-01-24 NOTE — Progress Notes (Signed)
Primary Care Physician: [name], MD Referring Physician: Dr. Vanessa Barbara is a 63 y.o. male with a h/o paroxysmal afib and flutter, s/p ablation x 2, last one in April 2021, on amiodarone , that is in the afib clinic, for f/u. He is in an atypical atrial flutter today. He does not feel any different in rhythm than out of rhythm.  He shows me his Lourena Simmonds which shows intermittent SR. He continues on eliquis for a CHA2DS2VASc score of 1. His labs TSH, cmet were normal checked last in June by PCP, reviewed on pt's phone.   F/u in afib clinic, 06/01/21, he appears to be in persistent  atrial flutter now. He has stopped amiodarone under the recent guidance of Dr. [name], with Lourena Simmonds and Zio patch showing persistent atrial  flutter and he has  decided to live in atrial flutter. He has noted some increase in BP and HR over the last week but this coincides with a cortisone shot for his chronic back pain 10 days ago. He is on max dose of Cardizem but is an intermittent acting  tablet and probably wears off after 8 hours use. We discussed changing over to long acting Cardizem to see if improvement and also how cortisone  can have short term consequences to HR and BP. He will need additional cortisone over time as surgery is not an option with his back issues.   Follow up in the AF clinic 09/26/22. Patient is s/p convergent with Dr Herbert Deaner at [facility] 11/2021. He reports that he has done very well since that time. His last ILR report showed 1.1% afib burden. Had back surgery and never resumed Eliquis.   Follow up in the AF clinic 01/24/24. Patient returns for follow up for atrial fibrillation. Patient reports that he has rare palpitations lasting only 1-2 seconds. He is in SR today. He is no longer followed at [facility].   Today, he denies symptoms of chest pain, orthopnea, PND, lower extremity edema, dizziness, presyncope, syncope, snoring, daytime somnolence, bleeding, or neurologic sequela. The patient is  tolerating medications without difficulties and is otherwise without complaint today.    Past Medical History:  Diagnosis Date   Arthritis    Atrial flutter (HCC)    diagnosed 2005   Depression    Diverticular disease    GERD (gastroesophageal reflux disease)    Obesity    Paroxysmal atrial fibrillation (HCC)    Primary localized osteoarthritis of left knee     Current Outpatient Medications  Medication Sig Dispense Refill   acetaminophen (TYLENOL) 650 MG CR tablet Take 1,300 mg by mouth 2 (two) times daily.     diclofenac (VOLTAREN) 50 MG EC tablet Take 50 mg by mouth daily.      Docusate Sodium (COLACE PO) Take 1 tablet by mouth daily.     methocarbamol (ROBAXIN) 500 MG tablet Take 500 mg by mouth every 8 (eight) hours as needed for muscle spasms.      oxyCODONE-acetaminophen (PERCOCET/ROXICET) 5-325 MG tablet Take 1 tablet by mouth 2 (two) times daily.     sildenafil (REVATIO) 20 MG tablet Take 20 mg by mouth daily as needed (ED).     traMADol (ULTRAM) 50 MG tablet Take 100 mg by mouth 3 (three) times daily.     atenolol (TENORMIN) 50 MG tablet Take 1 tablet (50 mg total) by mouth 2 (two) times daily. 90 tablet 3   No current facility-administered medications for this encounter.  ROS- All systems are reviewed and negative except as per the HPI above  Physical Exam: Vitals:   01/24/24 0850  BP: 118/80  Pulse: 79  Weight: 109.2 kg  Height: 5\' 9"  (1.753 m)    Wt Readings from Last 3 Encounters:  01/24/24 109.2 kg  09/26/22 112.4 kg  07/06/22 112.9 kg    GEN: Well nourished, well developed in no acute distress CARDIAC: Regular rate and rhythm, no murmurs, rubs, gallops RESPIRATORY:  Clear to auscultation without rales, wheezing or rhonchi  ABDOMEN: Soft, non-tender, non-distended EXTREMITIES:  No edema; No deformity    EKG today demonstrates SR, PACs Vent. rate 79 BPM PR interval 152 ms QRS duration 94 ms QT/QTcB 378/433 ms   CHA2DS2-VASc Score = 2  The  patient's score is based upon: CHF History: 0 HTN History: 1 Diabetes History: 0 Stroke History: 0 Vascular Disease History: 1 Age Score: 0 Gender Score: 0       ASSESSMENT AND PLAN: Longstanding Persistent Atrial Fibrillation/atrial flutter The patient's CHA2DS2-VASc score is 2, indicating a 2.2% annual risk of stroke.   S/p afib ablation 12/2018 and 03/2019 S/p convergent 11/2021 with Dr Herbert Deaner Patient still has ILR in place but has declined further remote monitoring. We discussed risks and benefits of anticoagulation, he continues to prefer to stay off anticoagulation.  Continue atenolol 50 mg BID  Secondary Hypercoagulable State (ICD10:  D68.69) The patient is at significant risk for stroke/thromboembolism based upon his CHA2DS2-VASc Score of 2.  However, the patient is not on anticoagulation due to patient preference.    HTN Stable on current regimen  CAD CAC score 1083 on CT Nonobstructive on [facility] 09/2021 No anginal symptoms   Follow up to establish care with EP in one year.    [name] PA-C [facility] [address] [place], [place] [address] [phone]

## 2024-03-01 ENCOUNTER — Encounter (HOSPITAL_COMMUNITY): Payer: Self-pay | Admitting: Neurology

## 2024-03-01 ENCOUNTER — Emergency Department (HOSPITAL_COMMUNITY)

## 2024-03-01 ENCOUNTER — Observation Stay (HOSPITAL_COMMUNITY)
Admission: EM | Admit: 2024-03-01 | Discharge: 2024-03-02 | Disposition: A | Discharge status: Home / Self Care | Attending: Neurology | Admitting: Neurology

## 2024-03-01 ENCOUNTER — Inpatient Hospital Stay (HOSPITAL_BASED_OUTPATIENT_CLINIC_OR_DEPARTMENT_OTHER)

## 2024-03-01 ENCOUNTER — Other Ambulatory Visit: Payer: Self-pay

## 2024-03-01 DIAGNOSIS — R42 Dizziness and giddiness: Secondary | ICD-10-CM | POA: Diagnosis present

## 2024-03-01 DIAGNOSIS — I6501 Occlusion and stenosis of right vertebral artery: Secondary | ICD-10-CM | POA: Insufficient documentation

## 2024-03-01 DIAGNOSIS — M4800 Spinal stenosis, site unspecified: Secondary | ICD-10-CM | POA: Diagnosis not present

## 2024-03-01 DIAGNOSIS — R197 Diarrhea, unspecified: Secondary | ICD-10-CM | POA: Diagnosis not present

## 2024-03-01 DIAGNOSIS — N390 Urinary tract infection, site not specified: Secondary | ICD-10-CM | POA: Insufficient documentation

## 2024-03-01 DIAGNOSIS — I63211 Cerebral infarction due to unspecified occlusion or stenosis of right vertebral arteries: Secondary | ICD-10-CM

## 2024-03-01 DIAGNOSIS — Z7901 Long term (current) use of anticoagulants: Secondary | ICD-10-CM | POA: Diagnosis not present

## 2024-03-01 DIAGNOSIS — I10 Essential (primary) hypertension: Secondary | ICD-10-CM | POA: Diagnosis not present

## 2024-03-01 DIAGNOSIS — I6389 Other cerebral infarction: Secondary | ICD-10-CM

## 2024-03-01 DIAGNOSIS — Z87891 Personal history of nicotine dependence: Secondary | ICD-10-CM | POA: Diagnosis not present

## 2024-03-01 DIAGNOSIS — K5790 Diverticulosis of intestine, part unspecified, without perforation or abscess without bleeding: Secondary | ICD-10-CM | POA: Diagnosis not present

## 2024-03-01 DIAGNOSIS — Z96652 Presence of left artificial knee joint: Secondary | ICD-10-CM | POA: Diagnosis not present

## 2024-03-01 DIAGNOSIS — I2510 Atherosclerotic heart disease of native coronary artery without angina pectoris: Secondary | ICD-10-CM | POA: Diagnosis not present

## 2024-03-01 DIAGNOSIS — Z9889 Other specified postprocedural states: Secondary | ICD-10-CM | POA: Insufficient documentation

## 2024-03-01 DIAGNOSIS — R0609 Other forms of dyspnea: Secondary | ICD-10-CM | POA: Insufficient documentation

## 2024-03-01 DIAGNOSIS — I4811 Longstanding persistent atrial fibrillation: Secondary | ICD-10-CM | POA: Diagnosis not present

## 2024-03-01 DIAGNOSIS — Z79899 Other long term (current) drug therapy: Secondary | ICD-10-CM | POA: Insufficient documentation

## 2024-03-01 DIAGNOSIS — I639 Cerebral infarction, unspecified: Secondary | ICD-10-CM

## 2024-03-01 DIAGNOSIS — M1712 Unilateral primary osteoarthritis, left knee: Secondary | ICD-10-CM | POA: Diagnosis not present

## 2024-03-01 DIAGNOSIS — I491 Atrial premature depolarization: Secondary | ICD-10-CM | POA: Insufficient documentation

## 2024-03-01 HISTORY — DX: Cerebral infarction, unspecified: I63.9

## 2024-03-01 LAB — CBC
HCT: 45.1 % (ref 39.0–52.0)
Hemoglobin: 15.1 g/dL (ref 13.0–17.0)
MCH: 31.2 pg (ref 26.0–34.0)
MCHC: 33.5 g/dL (ref 30.0–36.0)
MCV: 93.2 fL (ref 80.0–100.0)
Platelets: 183 10*3/uL (ref 150–400)
RBC: 4.84 MIL/uL (ref 4.22–5.81)
RDW: 13.9 % (ref 11.5–15.5)
WBC: 9.6 10*3/uL (ref 4.0–10.5)
nRBC: 0 % (ref 0.0–0.2)

## 2024-03-01 LAB — COMPREHENSIVE METABOLIC PANEL
ALT: 40 U/L (ref 0–44)
AST: 36 U/L (ref 15–41)
Albumin: 3.6 g/dL (ref 3.5–5.0)
Alkaline Phosphatase: 55 U/L (ref 38–126)
Anion gap: 5 (ref 5–15)
BUN: 17 mg/dL (ref 8–23)
CO2: 24 mmol/L (ref 22–32)
Calcium: 8.2 mg/dL — ABNORMAL LOW (ref 8.9–10.3)
Chloride: 109 mmol/L (ref 98–111)
Creatinine, Ser: 0.9 mg/dL (ref 0.61–1.24)
GFR, Estimated: >60 mL/min (ref >60)
Glucose, Bld: 156 mg/dL — ABNORMAL HIGH (ref 70–99)
Potassium: 4.4 mmol/L (ref 3.5–5.1)
Sodium: 138 mmol/L (ref 135–145)
Total Bilirubin: 0.3 mg/dL (ref 0.0–1.2)
Total Protein: 6.4 g/dL — ABNORMAL LOW (ref 6.5–8.1)

## 2024-03-01 LAB — I-STAT CHEM 8, ED
BUN: 17 mg/dL (ref 8–23)
Calcium, Ion: 1.08 mmol/L — ABNORMAL LOW (ref 1.15–1.40)
Chloride: 106 mmol/L (ref 98–111)
Creatinine, Ser: 0.9 mg/dL (ref 0.61–1.24)
Glucose, Bld: 159 mg/dL — ABNORMAL HIGH (ref 70–99)
HCT: 45 % (ref 39.0–52.0)
Hemoglobin: 15.3 g/dL (ref 13.0–17.0)
Potassium: 4.4 mmol/L (ref 3.5–5.1)
Sodium: 141 mmol/L (ref 135–145)
TCO2: 24 mmol/L (ref 22–32)

## 2024-03-01 LAB — MRSA NEXT GEN BY PCR, NASAL: MRSA by PCR Next Gen: NOT DETECTED

## 2024-03-01 LAB — DIFFERENTIAL
Abs Immature Granulocytes: 0.04 10*3/uL (ref 0.00–0.07)
Basophils Absolute: 0.1 10*3/uL (ref 0.0–0.1)
Basophils Relative: 1 %
Eosinophils Absolute: 0.2 10*3/uL (ref 0.0–0.5)
Eosinophils Relative: 2 %
Immature Granulocytes: 0 %
Lymphocytes Relative: 21 %
Lymphs Abs: 2 10*3/uL (ref 0.7–4.0)
Monocytes Absolute: 0.7 10*3/uL (ref 0.1–1.0)
Monocytes Relative: 7 %
Neutro Abs: 6.6 10*3/uL (ref 1.7–7.7)
Neutrophils Relative %: 69 %

## 2024-03-01 LAB — HIV ANTIBODY (ROUTINE TESTING W REFLEX): HIV Screen 4th Generation wRfx: NONREACTIVE

## 2024-03-01 LAB — PROTIME-INR
INR: 0.9 (ref 0.8–1.2)
Prothrombin Time: 12.8 s (ref 11.4–15.2)

## 2024-03-01 LAB — ECHOCARDIOGRAM COMPLETE
Area-P 1/2: 2.99 cm2
S' Lateral: 3.7 cm
Weight: 3915.37 [oz_av]

## 2024-03-01 LAB — HEMOGLOBIN A1C
Hgb A1c MFr Bld: 5.4 % (ref 4.8–5.6)
Mean Plasma Glucose: 108.28 mg/dL

## 2024-03-01 LAB — ETHANOL: Alcohol, Ethyl (B): <10 mg/dL (ref <10)

## 2024-03-01 LAB — APTT: aPTT: 24 s (ref 24–36)

## 2024-03-01 LAB — CBG MONITORING, ED: Glucose-Capillary: 154 mg/dL — ABNORMAL HIGH (ref 70–99)

## 2024-03-01 MED ORDER — STROKE: EARLY STAGES OF RECOVERY BOOK
Freq: Once | Status: AC
  Filled 2024-03-01: qty 1

## 2024-03-01 MED ORDER — OXYCODONE-ACETAMINOPHEN 5-325 MG PO TABS
1.0000 | ORAL_TABLET | Freq: Two times a day (BID) | ORAL | Status: DC | PRN
  Administered 2024-03-01 – 2024-03-02 (×2): 1 via ORAL
  Filled 2024-03-01 (×2): qty 1

## 2024-03-01 MED ORDER — LORAZEPAM 2 MG/ML IJ SOLN: 0.5000 mg | Freq: Once | INTRAMUSCULAR | Status: DC

## 2024-03-01 MED ORDER — ONDANSETRON HCL 4 MG/2ML IJ SOLN
4.0000 mg | Freq: Once | INTRAMUSCULAR | Status: AC
  Administered 2024-03-01: 4 mg via INTRAVENOUS
  Filled 2024-03-01: qty 2

## 2024-03-01 MED ORDER — SODIUM CHLORIDE 0.9 % IV SOLN: INTRAVENOUS | Status: DC

## 2024-03-01 MED ORDER — MORPHINE SULFATE (PF) 4 MG/ML IV SOLN
4.0000 mg | Freq: Once | INTRAVENOUS | Status: AC
  Administered 2024-03-01: 4 mg via INTRAVENOUS
  Filled 2024-03-01: qty 1

## 2024-03-01 MED ORDER — TRAMADOL HCL 50 MG PO TABS
100.0000 mg | ORAL_TABLET | Freq: Three times a day (TID) | ORAL | Status: DC | PRN
  Administered 2024-03-01: 100 mg via ORAL
  Filled 2024-03-01: qty 2

## 2024-03-01 MED ORDER — ONDANSETRON HCL 4 MG/2ML IJ SOLN
4.0000 mg | Freq: Once | INTRAMUSCULAR | Status: AC
  Administered 2024-03-01: 4 mg via INTRAVENOUS

## 2024-03-01 MED ORDER — TENECTEPLASE FOR STROKE
25.0000 mg | PACK | Freq: Once | INTRAVENOUS | Status: AC
Start: 2024-03-01 — End: 2024-03-01
  Administered 2024-03-01: 25 mg via INTRAVENOUS
  Filled 2024-03-01: qty 10

## 2024-03-01 MED ORDER — TRAMADOL HCL 50 MG PO TABS
100.0000 mg | ORAL_TABLET | Freq: Three times a day (TID) | ORAL | Status: DC | PRN
  Administered 2024-03-01 – 2024-03-02 (×2): 100 mg via ORAL
  Filled 2024-03-01 (×3): qty 2

## 2024-03-01 MED ORDER — PANTOPRAZOLE SODIUM 40 MG IV SOLR
40.0000 mg | Freq: Every day | INTRAVENOUS | Status: DC
  Filled 2024-03-01: qty 10

## 2024-03-01 MED ORDER — IOHEXOL 350 MG/ML SOLN
75.0000 mL | Freq: Once | INTRAVENOUS | Status: AC | PRN
  Administered 2024-03-01: 75 mL via INTRAVENOUS

## 2024-03-01 MED ORDER — ATENOLOL 50 MG PO TABS
50.0000 mg | ORAL_TABLET | Freq: Two times a day (BID) | ORAL | Status: DC
  Administered 2024-03-01 – 2024-03-02 (×2): 50 mg via ORAL
  Filled 2024-03-01 (×2): qty 1

## 2024-03-01 NOTE — Progress Notes (Signed)
 During admission, patient notified this RN of the need to have a bowel movement in the near future. This RN educated the patient on the order for bedrest post-TNK and the importance of the compliance of this rule. Patient acknowledged what the RN said, but replied "I will just get out of bed and go to the bathroom when you leave. I refuse to use a bedpan.  Dr. Roda Shutters and Artis Flock, NP notified of the statement made by this patient. Artis Flock informed this nurse that it is preferred that the patient complies with bedrest but that a bedside commode may be used with supervision if the patient insists.  03/01/2024 Oralia Manis, RN, BSN 4:33 PM

## 2024-03-01 NOTE — Code Documentation (Signed)
 [name] is a 63 yr old male with a PMH of AF HTN chronic pain arriving to [facility] via EMS on 03/01/2024. Pt is coming from home where he was LKW this morning at about 0545, and is now C/O diplopia, N, V. He is not taking any blood thinners.    Pt met at bridge. Labs, CBG obtained at bridge. Airway cleared by EDP. Pt to CT with team. NIHSS 3. Pt with H/A diplopia, nausea, ataxia and rt sensory loss. Please see documentation for NIHSS details and timeline. The following imaging was obtained: CT, CTA. Per Dr. Amada Jupiter, CT is neg for hemorrhage. Pt consented for thrombolytic, and TNK given at 0754. CTA is negative for LVO per Dr. Amada Jupiter.     Pt back to ED room 31 where his workup will continue. He will need q 15 min NIHSS and VS for 2 hr, then q 30 for 6 hr, then q 1. SBP < 180/105. NPO until passes Stroke Swallow Screen. Bedside handoff with ED RN complete.

## 2024-03-01 NOTE — ED Triage Notes (Signed)
 Pt BIB [facility] from home as Code Stroke, pt reports 0545 when s/s started & family reports LKN for them is 0615. Pt had a HA that radiated through the back of his Rt eye & down the back of his neck, reports he feels like his Rt arm is floating, feels dizzy & cannot stand. Also has had some n/v. A/Ox4, 150/100, CBG 150, 12L normal, no thinners, bil 18g PIV in ACs.

## 2024-03-01 NOTE — Progress Notes (Signed)
 PHARMACIST CODE STROKE RESPONSE  Notified to mix TNK at 0752 by Dr. Amada Jupiter  TNK preparation completed at 0754  TNK dose = 25 mg IV over 5 seconds  Issues/delays encountered (if applicable): no blood pressure delays  Marja Kays 03/01/24 7:52 AM

## 2024-03-01 NOTE — H&P (Signed)
 NEUROLOGY H&P NOTE   Date of service: March 01, 2024 Patient Name: [name] MRN:  [id number] DOB:  1961-11-13 Chief Complaint: "Dizziness"  History of Present Illness  [name] is a 63 y.o. male with hx of hypertension, atrial fibrillation status post ablations who presents with dizziness that started abruptly this morning.  He states that he felt normal until about 5:45 AM at which point he started noticing a right-sided headache and dizziness.  He states that the headache is at the back of his neck and extends down into his neck.  Due to the symptoms he called his wife at about 64 and then EMS.  EMS reports that he was unable to walk, required two people to get him up.  Last known well: 5:45 AM Modified rankin score: 0-Completely asymptomatic and back to baseline post- stroke IV Thrombolysis: Yes  Thrombectomy: no  NIHSS components Score: Comment  1a Level of Conscious 0[x]  1[]  2[]  3[]      1b LOC Questions 0[x]  1[]  2[]       1c LOC Commands 0[x]  1[]  2[]       2 Best Gaze 0[x]  1[]  2[]       3 Visual 0[x]  1[]  2[]  3[]      4 Facial Palsy 0[x]  1[]  2[]  3[]      5a Motor Arm - left 0[x]  1[]  2[]  3[]  4[]  UN[]    5b Motor Arm - Right 0[x]  1[]  2[]  3[]  4[]  UN[]    6a Motor Leg - Left 0[x]  1[]  2[]  3[]  4[]  UN[]    6b Motor Leg - Right 0[x]  1[]  2[]  3[]  4[]  UN[]    7 Limb Ataxia 0[]  1[]  2[x]  3[]  UN[]     8 Sensory 0[]  1[x]  2[]  UN[]      9 Best Language 0[x]  1[]  2[]  3[]      10 Dysarthria 0[x]  1[]  2[]  UN[]      11 Extinct. and Inattention 0[x]  1[]  2[]       TOTAL: 3      Past History   Past Medical History:  Diagnosis Date   Arthritis    Atrial flutter (HCC)    diagnosed 2005   Depression    Diverticular disease    GERD (gastroesophageal reflux disease)    Obesity    Paroxysmal atrial fibrillation (HCC)    Primary localized osteoarthritis of left knee    Past Surgical History:  Procedure Laterality Date   ATRIAL FIBRILLATION ABLATION N/A 01/24/2019   Procedure: ATRIAL  FIBRILLATION ABLATION;  Surgeon: Hillis Range, MD;  Location: [facility] INVASIVE CV LAB;  Service: Cardiovascular;  Laterality: N/A;   ATRIAL FIBRILLATION ABLATION N/A 04/14/2020   Procedure: ATRIAL FIBRILLATION ABLATION;  Surgeon: Hillis Range, MD;  Location: [facility] INVASIVE CV LAB;  Service: Cardiovascular;  Laterality: N/A;   CARDIOVERSION N/A 12/06/2018   Procedure: CARDIOVERSION;  Surgeon: Parke Poisson, MD;  Location: [facility] ENDOSCOPY;  Service: Cardiovascular;  Laterality: N/A;   CARDIOVERSION N/A 03/13/2019   Procedure: CARDIOVERSION;  Surgeon: Wendall Stade, MD;  Location: [facility] ENDOSCOPY;  Service: Cardiovascular;  Laterality: N/A;   CARDIOVERSION N/A 03/05/2020   Procedure: CARDIOVERSION;  Surgeon: Sande Rives, MD;  Location: [facility] ENDOSCOPY;  Service: Cardiovascular;  Laterality: N/A;   COLON SURGERY  12/07, 5/08, 10/08   COLONOSCOPY     COLOSTOMY  2007   COLOSTOMY REVERSAL     EYE SURGERY Bilateral    cataracts   INCISIONAL HERNIA REPAIR  2008   with mesh   KNEE ARTHROSCOPY Left 5/10   x2   LEFT HEART CATH  AND CORONARY ANGIOGRAPHY N/A 10/01/2021   Procedure: LEFT HEART CATH AND CORONARY ANGIOGRAPHY;  Surgeon: Lyn Records, MD;  Location: [facility] INVASIVE CV LAB;  Service: Cardiovascular;  Laterality: N/A;   MULTIPLE TOOTH EXTRACTIONS     neck fusion  5/11   REPLANTATION THUMB Right    TONSILLECTOMY     TOTAL KNEE ARTHROPLASTY Left 08/23/2017   TOTAL KNEE ARTHROPLASTY Left 08/23/2017   Procedure: LEFT TOTAL KNEE ARTHROPLASTY;  Surgeon: Salvatore Marvel, MD;  Location: [facility] OR;  Service: Orthopedics;  Laterality: Left;   WISDOM TOOTH EXTRACTION     Family History  Problem Relation Age of Onset   Hypertension Mother    CAD Father    Hypertension Father    Diabetes Father    Colon polyps Father    CAD Sister    Cancer Paternal Grandfather        brain and lung   Social History   Socioeconomic History   Marital status: Married    Spouse name: Not on file   Number of children:  Not on file   Years of education: Not on file   Highest education level: Not on file  Occupational History   Not on file  Tobacco Use   Smoking status: Former    Current packs/day: 0.00    Types: Cigarettes    Start date: 08/11/2010    Quit date: 08/11/2012    Years since quitting: 11.5   Smokeless tobacco: Never   Tobacco comments:    Former smoker 09/26/22  Vaping Use   Vaping status: Never Used  Substance and Sexual Activity   Alcohol use: Yes    Alcohol/week: 4.0 standard drinks of alcohol    Types: 4 Cans of beer per week    Comment: 2 beers saturday and sunday 09/26/22   Drug use: No   Sexual activity: Not on file    Comment: married ([name])  Other Topics Concern   Not on file  Social History Narrative   Not on file   Social Drivers of Health   Financial Resource Strain: Low Risk  (11/26/2021)   Received from [facility], [facility]   Overall Financial Resource Strain (CARDIA)    Difficulty of Paying Living Expenses: Not very hard  Food Insecurity: No Food Insecurity (11/26/2021)   Received from [facility], [facility]   Hunger Vital Sign    Worried About Running Out of Food in the Last Year: Never true    Ran Out of Food in the Last Year: Never true  Transportation Needs: No Transportation Needs (11/26/2021)   Received from [facility], [facility]   [facility] - Transportation    Lack of Transportation (Medical): No    Lack of Transportation (Non-Medical): No  Physical Activity: Not on file  Stress: Not on file  Social Connections: Not on file   Allergies  Allergen Reactions   Erythromycin Other (See Comments)    Abdominal cramps    Hydrocodone-Acetaminophen Other (See Comments)    Makes him feel "high"   Mobic [Meloxicam] Other (See Comments)    Lower back and kidney pain    Prednisone Hives, Anxiety and Other (See Comments)    Makes him anxious   Z-Pak [Azithromycin] Nausea Only and Other (See Comments)    Severe stomach  cramps    Cefuroxime     worsening back pain?   Celecoxib     myalgias   Duloxetine Hcl  spacy   Lopressor [Metoprolol Tartrate] Swelling   Metaxalone     Unknown   Amoxicillin Palpitations   Dilaudid [Hydromorphone] Palpitations   Penicillins Rash and Other (See Comments)    Has patient had a PCN reaction causing immediate rash, facial/tongue/throat swelling, SOB or lightheadedness with hypotension: No Has patient had a PCN reaction causing severe rash involving mucus membranes or skin necrosis: No Has patient had a PCN reaction that required hospitalization: No Has patient had a PCN reaction occurring within the last 10 years: No If all of the above answers are "NO", then may proceed with Cephalosporin use.      Medications   Current Facility-Administered Medications:     stroke: early stages of recovery book, , Does not apply, Once, Rejeana Brock, MD   0.9 %  sodium chloride infusion, , Intravenous, Continuous, Burlene Montecalvo, Hardin Negus, MD   pantoprazole (PROTONIX) injection 40 mg, 40 mg, Intravenous, QHS, Eddy Liszewski, Hardin Negus, MD  Current Outpatient Medications:    acetaminophen (TYLENOL) 650 MG CR tablet, Take 1,300 mg by mouth 2 (two) times daily., Disp: , Rfl:    atenolol (TENORMIN) 50 MG tablet, Take 1 tablet (50 mg total) by mouth 2 (two) times daily., Disp: 90 tablet, Rfl: 3   diclofenac (VOLTAREN) 50 MG EC tablet, Take 50 mg by mouth daily. , Disp: , Rfl:    Docusate Sodium (COLACE PO), Take 1 tablet by mouth daily., Disp: , Rfl:    methocarbamol (ROBAXIN) 500 MG tablet, Take 500 mg by mouth every 8 (eight) hours as needed for muscle spasms. , Disp: , Rfl:    oxyCODONE-acetaminophen (PERCOCET/ROXICET) 5-325 MG tablet, Take 1 tablet by mouth 2 (two) times daily., Disp: , Rfl:    sildenafil (REVATIO) 20 MG tablet, Take 20 mg by mouth daily as needed (ED)., Disp: , Rfl:    traMADol (ULTRAM) 50 MG tablet, Take 100 mg by mouth 3 (three) times daily., Disp: ,  Rfl:    Vitals   Vitals:   03/01/24 0700 03/01/24 0745 03/01/24 0800 03/01/24 0805  BP:  (!) 154/78 139/71 (!) 144/90  Pulse:    69  Resp:    14  SpO2:   93% 90%  Weight: 111 kg        Body mass index is 36.14 kg/m.  Physical Exam   Psych: Affect appropriate to situation.  Eyes: No scleral injection.  HENT: No OP obstruction.  Head: Normocephalic.  Cardiovascular: Normal rate and regular rhythm.  Respiratory: Effort normal, non-labored breathing.  GI: Soft.  No distension. There is no tenderness.  Skin: WDI.    Neurologic Examination    Neuro: Mental Status: Patient is awake, alert, oriented to person, place, month, year, and situation. Patient is able to give a clear and coherent history. No signs of aphasia or neglect Cranial Nerves: II: Visual Fields are full. Pupils are equal, round, and reactive to light.   III,IV, VI: EOMI without ptosis, but he does complain of diplopia.  He has multidirectional nystagmus with right beating on right gaze, left beating on left gaze and rotational on upgaze V: Facial sensation is reduced on the right VII: Facial movement is symmetric.  VIII: hearing is intact to voice X: Uvula elevates symmetrically XII: tongue is midline without atrophy or fasciculations.  Motor: Tone is normal. Bulk is normal. 5/5 strength was present in all four extremities.  Sensory: Sensation is symmetric to light touch and temperature in the arms and legs. Deep Tendon Reflexes: 2+ and  symmetric in the biceps and patellae.  Cerebellar: FNF and HKS are intact on the left, he is ataxic on the right.         Labs   CBC:  Recent Labs  Lab 03/01/24 0745 03/01/24 0746  WBC 9.6  --   NEUTROABS 6.6  --   HGB 15.1 15.3  HCT 45.1 45.0  MCV 93.2  --   PLT 183  --    Basic Metabolic Panel:  Lab Results  Component Value Date   NA 141 03/01/2024   K 4.4 03/01/2024   CO2 20 (L) 02/13/2022   GLUCOSE 159 (H) 03/01/2024   BUN 17 03/01/2024    CREATININE 0.90 03/01/2024   CALCIUM 9.5 02/13/2022   GFRNONAA >60 02/13/2022   GFRAA 96 04/01/2020   INR  Lab Results  Component Value Date   INR 0.95 11/18/2014   APTT  Lab Results  Component Value Date   APTT 28 11/18/2014     CT Head without contrast(Personally reviewed): negaitve  CT angio Head and Neck with contrast(Personally reviewed): R vert occlusion Assessment   [name] is a 63 y.o. male who presents with acute onset right-sided ataxia, diplopia, multidirectional nystagmus in the setting of vertebral occlusion, very much consistent with an acute ischemic infarct.  He was in the timeframe for IV tenecteplase and received this.  He will need to be admitted to the intensive care unit for post TNK care.  Primary Diagnosis:  Cerebral infarction due to occlusion or stenosis of right vertebral artery.   Secondary Diagnosis: Obesity Paroxysmal atrial fibrillation  Recommendations  - HgbA1c, fasting lipid panel - MRI of the brain without contrast - Frequent neuro checks - Echocardiogram - Prophylactic therapy-none for 24 hours - Risk factor modification - Telemetry monitoring - PT consult, OT consult, Speech consult - Stroke team to follow  ______________________________________________________________________   Signed, Ritta Slot, MD [facility]  Risks, benefits and alternatives of IVT discussed with patient and/or family and they agreed. CTH personally reviewed prior to TNK administration  This patient is critically ill and at significant risk of neurological worsening, death and care requires constant monitoring of vital signs, hemodynamics,respiratory and cardiac monitoring, neurological assessment, discussion with family, other specialists and medical decision making of high complexity. I spent 45 minutes of neurocritical care time  in the care of  this patient. This was time spent independent of any time provided by nurse  practitioner or PA.  Ritta Slot, MD [facility]   If 7pm- 7am, please page neurology on call as listed in AMION. 03/01/2024  10:40 AM

## 2024-03-01 NOTE — ED Provider Notes (Signed)
 [place] EMERGENCY DEPARTMENT AT [facility] Provider Note   CSN: [id number] Arrival date & time: 03/01/24  4098     History  Chief Complaint  Patient presents with   Code Stroke    [name] is a 63 y.o. male.  Patient presents as code stroke activation via EMS. Pt indicates approximately 2 hours pta acute onset dizziness, unsteadiness, posterior right headache, right arm dysesthesia feeling as if arm floating or not under control and double vision, nausea. Denies hx same symptoms. Felt fine just prior, had already been awake/up and had felt normal immediately prior. No trauma/fall. No anticoagulant use. EMS notes cbg 150.   The history is provided by the patient, medical records and the EMS personnel.       Home Medications Prior to Admission medications   Medication Sig Start Date End Date Taking? Authorizing Provider  acetaminophen (TYLENOL) 650 MG CR tablet Take 1,300 mg by mouth 2 (two) times daily.    [provider]  atenolol (TENORMIN) 50 MG tablet Take 1 tablet (50 mg total) by mouth 2 (two) times daily. 01/24/24   Fenton, Clint R, PA  diclofenac (VOLTAREN) 50 MG EC tablet Take 50 mg by mouth daily.     [provider]  Docusate Sodium (COLACE PO) Take 1 tablet by mouth daily.    [provider]  methocarbamol (ROBAXIN) 500 MG tablet Take 500 mg by mouth every 8 (eight) hours as needed for muscle spasms.     [provider]  oxyCODONE-acetaminophen (PERCOCET/ROXICET) 5-325 MG tablet Take 1 tablet by mouth 2 (two) times daily. 05/06/20   [provider]  sildenafil (REVATIO) 20 MG tablet Take 20 mg by mouth daily as needed (ED). 04/24/20   [provider]  traMADol (ULTRAM) 50 MG tablet Take 100 mg by mouth 3 (three) times daily. 04/12/20   [provider]      Allergies    Erythromycin, Hydrocodone-acetaminophen, Mobic [meloxicam], Prednisone, Z-pak [azithromycin], Cefuroxime, Celecoxib,  Duloxetine hcl, Lopressor [metoprolol tartrate], Metaxalone, Amoxicillin, Dilaudid [hydromorphone], and Penicillins    Review of Systems   Review of Systems  Constitutional:  Negative for fever.  HENT:  Negative for trouble swallowing.   Eyes:  Positive for visual disturbance. Negative for pain.  Respiratory:  Negative for cough and shortness of breath.   Cardiovascular:  Negative for chest pain.  Gastrointestinal:  Positive for nausea. Negative for abdominal pain.  Genitourinary:  Negative for flank pain.  Musculoskeletal:  Negative for back pain, neck pain and neck stiffness.  Skin:  Negative for rash.  Neurological:  Positive for dizziness and headaches. Negative for speech difficulty.    Physical Exam Updated Vital Signs BP (!) 132/90   Pulse 72   Temp 98 F (36.7 C)   Resp 18   Wt 111 kg   SpO2 93%   BMI 36.14 kg/m  Physical Exam Vitals and nursing note reviewed.  Constitutional:      Appearance: Normal appearance. He is well-developed.  HENT:     Head: Atraumatic.     Comments: No sinus or temporal tenderness.     Nose: Nose normal.     Mouth/Throat:     Mouth: Mucous membranes are moist.     Pharynx: Oropharynx is clear.  Eyes:     General: No scleral icterus.    Extraocular Movements: Extraocular movements intact.     Conjunctiva/sclera: Conjunctivae normal.     Pupils: Pupils are equal, round, and reactive to  light.  Neck:     Vascular: No carotid bruit.     Trachea: No tracheal deviation.     Comments: No stiffness or rigidity.  Cardiovascular:     Rate and Rhythm: Normal rate and regular rhythm.     Pulses: Normal pulses.     Heart sounds: Normal heart sounds. No murmur heard.    No friction rub. No gallop.  Pulmonary:     Effort: Pulmonary effort is normal. No accessory muscle usage or respiratory distress.     Breath sounds: Normal breath sounds.  Abdominal:     General: Bowel sounds are normal. There is no distension.     Palpations: Abdomen is  soft. There is no mass.     Tenderness: There is no abdominal tenderness. There is no guarding.  Genitourinary:    Comments: No cva tenderness. Musculoskeletal:        General: No swelling.     Cervical back: Normal range of motion and neck supple. No rigidity.     Comments: CTLS spine, non tender, aligned, no step off.   Skin:    General: Skin is warm and dry.     Findings: No rash.  Neurological:     Mental Status: He is alert.     Comments: Alert, speech clear, no gross dysarthria or aphasia. Motor fxn intact bil, stre 5/5. Sens grossly intact. +nystagmus.   Psychiatric:        Mood and Affect: Mood normal.     ED Results / Procedures / Treatments   Labs (all labs ordered are listed, but only abnormal results are displayed) Results for orders placed or performed during the hospital encounter of 03/01/24  CBG monitoring, ED   Collection Time: 03/01/24  7:41 AM  Result Value Ref Range   Glucose-Capillary 154 (H) 70 - 99 mg/dL  Ethanol   Collection Time: 03/01/24  7:45 AM  Result Value Ref Range   Alcohol, Ethyl (B) <10 <10 mg/dL  Protime-INR   Collection Time: 03/01/24  7:45 AM  Result Value Ref Range   Prothrombin Time 12.8 11.4 - 15.2 seconds   INR 0.9 0.8 - 1.2  APTT   Collection Time: 03/01/24  7:45 AM  Result Value Ref Range   aPTT 24 24 - 36 seconds  CBC   Collection Time: 03/01/24  7:45 AM  Result Value Ref Range   WBC 9.6 4.0 - 10.5 K/uL   RBC 4.84 4.22 - 5.81 MIL/uL   Hemoglobin 15.1 13.0 - 17.0 g/dL   HCT 16.1 09.6 - 04.5 %   MCV 93.2 80.0 - 100.0 fL   MCH 31.2 26.0 - 34.0 pg   MCHC 33.5 30.0 - 36.0 g/dL   RDW 40.9 81.1 - 91.4 %   Platelets 183 150 - 400 K/uL   nRBC 0.0 0.0 - 0.2 %  Differential   Collection Time: 03/01/24  7:45 AM  Result Value Ref Range   Neutrophils Relative % 69 %   Neutro Abs 6.6 1.7 - 7.7 K/uL   Lymphocytes Relative 21 %   Lymphs Abs 2.0 0.7 - 4.0 K/uL   Monocytes Relative 7 %   Monocytes Absolute 0.7 0.1 - 1.0 K/uL    Eosinophils Relative 2 %   Eosinophils Absolute 0.2 0.0 - 0.5 K/uL   Basophils Relative 1 %   Basophils Absolute 0.1 0.0 - 0.1 K/uL   Immature Granulocytes 0 %   Abs Immature Granulocytes 0.04 0.00 - 0.07 K/uL  Comprehensive metabolic panel  Collection Time: 03/01/24  7:45 AM  Result Value Ref Range   Sodium 138 135 - 145 mmol/L   Potassium 4.4 3.5 - 5.1 mmol/L   Chloride 109 98 - 111 mmol/L   CO2 24 22 - 32 mmol/L   Glucose, Bld 156 (H) 70 - 99 mg/dL   BUN 17 8 - 23 mg/dL   Creatinine, Ser 4.09 0.61 - 1.24 mg/dL   Calcium 8.2 (L) 8.9 - 10.3 mg/dL   Total Protein 6.4 (L) 6.5 - 8.1 g/dL   Albumin 3.6 3.5 - 5.0 g/dL   AST 36 15 - 41 U/L   ALT 40 0 - 44 U/L   Alkaline Phosphatase 55 38 - 126 U/L   Total Bilirubin 0.3 0.0 - 1.2 mg/dL   GFR, Estimated >81 >19 mL/min   Anion gap 5 5 - 15  I-stat chem 8, ED   Collection Time: 03/01/24  7:46 AM  Result Value Ref Range   Sodium 141 135 - 145 mmol/L   Potassium 4.4 3.5 - 5.1 mmol/L   Chloride 106 98 - 111 mmol/L   BUN 17 8 - 23 mg/dL   Creatinine, Ser 1.47 0.61 - 1.24 mg/dL   Glucose, Bld 829 (H) 70 - 99 mg/dL   Calcium, Ion 5.62 (L) 1.15 - 1.40 mmol/L   TCO2 24 22 - 32 mmol/L   Hemoglobin 15.3 13.0 - 17.0 g/dL   HCT 13.0 86.5 - 78.4 %   CT ANGIO HEAD NECK W WO CM (CODE STROKE) Result Date: 03/01/2024 CLINICAL DATA:  63 year old male code stroke presentation. EXAM: CT ANGIOGRAPHY HEAD AND NECK TECHNIQUE: Multidetector CT imaging of the head and neck was performed using the standard protocol during bolus administration of intravenous contrast. Multiplanar CT image reconstructions and MIPs were obtained to evaluate the vascular anatomy. Carotid stenosis measurements (when applicable) are obtained utilizing NASCET criteria, using the distal internal carotid diameter as the denominator. RADIATION DOSE REDUCTION: This exam was performed according to the departmental dose-optimization program which includes automated exposure control,  adjustment of the mA and/or kV according to patient size and/or use of iterative reconstruction technique. CONTRAST:  75mL OMNIPAQUE IOHEXOL 350 MG/ML SOLN COMPARISON:  Plain head CT 0747 hours today. FINDINGS: CTA NECK Skeleton: Chronic C5-C6 and C6-C7 ACDF. No acute osseous abnormality identified. Upper chest: Mild respiratory motion, otherwise negative. Other neck: Neck soft tissue spaces are within normal limits. Aortic arch: 4 vessel arch, left vertebral artery arises directly from the arch. Mild arch tortuosity but no atherosclerosis. Right carotid system: Negative through the distal right CCA. Soft and calcified plaque at the right ICA origin and bulb with no stenosis. Left carotid system: Negative through the mid left CCA. Distal left CCA anterior vessel wall soft plaque without stenosis. Soft and calcified plaque at the left ICA origin including bulky soft plaque at the bulb (series 9, image 171). Less than 50 % stenosis with respect to the distal vessel. Left ICA remains patent to the skull base. Vertebral arteries: Proximal right vertebral artery appears occluded, probably at its origin although detail is limited from lower cervical ACDF streak artifact. There is minimal reconstituted enhancement in the right V2 segment. Moderate reconstituted enhancement in the V3 segment and distal right vertebral is patent. See below. Left vertebral artery arises directly from the arch. Left vertebral is patent to the skull base with no significant plaque or stenosis. CTA HEAD Posterior circulation: Left vertebral V4 segment is diminutive and irregular but patent to the vertebrobasilar junction with  no focal stenosis. Reconstituted right V4 segment is moderately irregular and stenotic just before the vertebrobasilar junction on series 5, image 148 but remains patent. Right PICA appears to remain enhancing. Left PICA origin is patent. Basilar artery is patent with mild irregularity, no stenosis. Basilar tip, SCA and PCA  origins are normal. Posterior communicating arteries are diminutive or absent. Bilateral PCA branches are within normal limits. Anterior circulation: Both ICA siphons are patent. Mild to moderate left and mild right siphon calcified atherosclerosis with no significant stenosis. Patent carotid termini. Patent MCA and ACA origins. Normal anterior communicating artery. Bilateral ACA branches are within normal limits. Left MCA M1 segment and bifurcation are patent without stenosis. Right MCA M1 segment and bifurcation are patent without stenosis. Bilateral MCA branches are within normal limits. Venous sinuses: Patent. Anatomic variants: Left vertebral artery arises directly from the arch. Review of the MIP images confirms the above findings IMPRESSION: 1. Positive for Occluded proximal Right Vertebral Artery, probably at its origin (ACDF streak artifact). Reconstituted by the V3 segments. Right V4 is patent with moderate stenosis. Right PICA origin is patent. 2. Negative for intracranial large vessel occlusion. Carotid atherosclerosis in the neck and at the skull base but no hemodynamically significant stenosis. 3. C5-C6 and C6-C7 ACDF. #1 was communicated to Dr. Amada Jupiter at 8:15 am on 03/01/2024 by text page via the [facility] messaging system. Electronically Signed   By: Odessa Fleming M.D.   On: 03/01/2024 08:16   CT HEAD CODE STROKE WO CONTRAST Result Date: 03/01/2024 CLINICAL DATA:  Code stroke.  63 year old male. EXAM: CT HEAD WITHOUT CONTRAST TECHNIQUE: Contiguous axial images were obtained from the base of the skull through the vertex without intravenous contrast. RADIATION DOSE REDUCTION: This exam was performed according to the departmental dose-optimization program which includes automated exposure control, adjustment of the mA and/or kV according to patient size and/or use of iterative reconstruction technique. COMPARISON:  None Available. FINDINGS: Brain: Cerebral volume is within normal limits for age. No midline  shift, ventriculomegaly, mass effect, evidence of mass lesion, intracranial hemorrhage or evidence of cortically based acute infarction. Gray-white matter differentiation is within normal limits throughout the brain. Vascular: No suspicious intracranial vascular hyperdensity. Skull: Intact.  No acute osseous abnormality identified. Sinuses/Orbits: Bilateral maxillary sinus mucosal thickening and bubbly opacity appears inflammatory. Other Visualized paranasal sinuses and mastoids are well aerated. Other: No gaze deviation. No acute orbit or scalp soft tissue finding. ASPECTS [facility] Stroke Program Early CT Score) Total score (0-10 with 10 being normal): 10 IMPRESSION: 1. Normal for age noncontrast CT appearance of the brain. ASPECTS 10. 2. Bilateral maxillary sinus inflammation. 3. These results were communicated to Dr. Amada Jupiter at 7:52 am on 03/01/2024 by text page via the [facility] messaging system. Electronically Signed   By: Odessa Fleming M.D.   On: 03/01/2024 07:52    EKG None  Radiology CT ANGIO HEAD NECK W WO CM (CODE STROKE) Result Date: 03/01/2024 CLINICAL DATA:  63 year old male code stroke presentation. EXAM: CT ANGIOGRAPHY HEAD AND NECK TECHNIQUE: Multidetector CT imaging of the head and neck was performed using the standard protocol during bolus administration of intravenous contrast. Multiplanar CT image reconstructions and MIPs were obtained to evaluate the vascular anatomy. Carotid stenosis measurements (when applicable) are obtained utilizing NASCET criteria, using the distal internal carotid diameter as the denominator. RADIATION DOSE REDUCTION: This exam was performed according to the departmental dose-optimization program which includes automated exposure control, adjustment of the mA and/or kV according to patient size and/or use  of iterative reconstruction technique. CONTRAST:  75mL OMNIPAQUE IOHEXOL 350 MG/ML SOLN COMPARISON:  Plain head CT 0747 hours today. FINDINGS: CTA NECK Skeleton: Chronic  C5-C6 and C6-C7 ACDF. No acute osseous abnormality identified. Upper chest: Mild respiratory motion, otherwise negative. Other neck: Neck soft tissue spaces are within normal limits. Aortic arch: 4 vessel arch, left vertebral artery arises directly from the arch. Mild arch tortuosity but no atherosclerosis. Right carotid system: Negative through the distal right CCA. Soft and calcified plaque at the right ICA origin and bulb with no stenosis. Left carotid system: Negative through the mid left CCA. Distal left CCA anterior vessel wall soft plaque without stenosis. Soft and calcified plaque at the left ICA origin including bulky soft plaque at the bulb (series 9, image 171). Less than 50 % stenosis with respect to the distal vessel. Left ICA remains patent to the skull base. Vertebral arteries: Proximal right vertebral artery appears occluded, probably at its origin although detail is limited from lower cervical ACDF streak artifact. There is minimal reconstituted enhancement in the right V2 segment. Moderate reconstituted enhancement in the V3 segment and distal right vertebral is patent. See below. Left vertebral artery arises directly from the arch. Left vertebral is patent to the skull base with no significant plaque or stenosis. CTA HEAD Posterior circulation: Left vertebral V4 segment is diminutive and irregular but patent to the vertebrobasilar junction with no focal stenosis. Reconstituted right V4 segment is moderately irregular and stenotic just before the vertebrobasilar junction on series 5, image 148 but remains patent. Right PICA appears to remain enhancing. Left PICA origin is patent. Basilar artery is patent with mild irregularity, no stenosis. Basilar tip, SCA and PCA origins are normal. Posterior communicating arteries are diminutive or absent. Bilateral PCA branches are within normal limits. Anterior circulation: Both ICA siphons are patent. Mild to moderate left and mild right siphon calcified  atherosclerosis with no significant stenosis. Patent carotid termini. Patent MCA and ACA origins. Normal anterior communicating artery. Bilateral ACA branches are within normal limits. Left MCA M1 segment and bifurcation are patent without stenosis. Right MCA M1 segment and bifurcation are patent without stenosis. Bilateral MCA branches are within normal limits. Venous sinuses: Patent. Anatomic variants: Left vertebral artery arises directly from the arch. Review of the MIP images confirms the above findings IMPRESSION: 1. Positive for Occluded proximal Right Vertebral Artery, probably at its origin (ACDF streak artifact). Reconstituted by the V3 segments. Right V4 is patent with moderate stenosis. Right PICA origin is patent. 2. Negative for intracranial large vessel occlusion. Carotid atherosclerosis in the neck and at the skull base but no hemodynamically significant stenosis. 3. C5-C6 and C6-C7 ACDF. #1 was communicated to Dr. Amada Jupiter at 8:15 am on 03/01/2024 by text page via the [facility] messaging system. Electronically Signed   By: Odessa Fleming M.D.   On: 03/01/2024 08:16   CT HEAD CODE STROKE WO CONTRAST Result Date: 03/01/2024 CLINICAL DATA:  Code stroke.  63 year old male. EXAM: CT HEAD WITHOUT CONTRAST TECHNIQUE: Contiguous axial images were obtained from the base of the skull through the vertex without intravenous contrast. RADIATION DOSE REDUCTION: This exam was performed according to the departmental dose-optimization program which includes automated exposure control, adjustment of the mA and/or kV according to patient size and/or use of iterative reconstruction technique. COMPARISON:  None Available. FINDINGS: Brain: Cerebral volume is within normal limits for age. No midline shift, ventriculomegaly, mass effect, evidence of mass lesion, intracranial hemorrhage or evidence of cortically based acute infarction. Gray-white  matter differentiation is within normal limits throughout the brain. Vascular: No  suspicious intracranial vascular hyperdensity. Skull: Intact.  No acute osseous abnormality identified. Sinuses/Orbits: Bilateral maxillary sinus mucosal thickening and bubbly opacity appears inflammatory. Other Visualized paranasal sinuses and mastoids are well aerated. Other: No gaze deviation. No acute orbit or scalp soft tissue finding. ASPECTS [facility] Stroke Program Early CT Score) Total score (0-10 with 10 being normal): 10 IMPRESSION: 1. Normal for age noncontrast CT appearance of the brain. ASPECTS 10. 2. Bilateral maxillary sinus inflammation. 3. These results were communicated to Dr. Amada Jupiter at 7:52 am on 03/01/2024 by text page via the [facility] messaging system. Electronically Signed   By: Odessa Fleming M.D.   On: 03/01/2024 07:52    Procedures Procedures    Medications Ordered in ED Medications   stroke: early stages of recovery book (has no administration in time range)  0.9 %  sodium chloride infusion (has no administration in time range)  pantoprazole (PROTONIX) injection 40 mg (has no administration in time range)  morphine (PF) 4 MG/ML injection 4 mg (has no administration in time range)  ondansetron (ZOFRAN) injection 4 mg (has no administration in time range)  LORazepam (ATIVAN) injection 0.5 mg (has no administration in time range)  ondansetron (ZOFRAN) injection 4 mg (4 mg Intravenous Given 03/01/24 0744)  tenecteplase (TNKASE) injection for Stroke 25 mg (25 mg Intravenous Given 03/01/24 0754)  iohexol (OMNIPAQUE) 350 MG/ML injection 75 mL (75 mLs Intravenous Contrast Given 03/01/24 0757)    ED Course/ Medical Decision Making/ A&P                                 Medical Decision Making Problems Addressed: Acute CVA (cerebrovascular accident) [facility]): acute illness or injury with systemic symptoms that poses a threat to life or bodily functions Occlusion of right vertebral artery: acute illness or injury with systemic symptoms that poses a threat to life or bodily functions  Amount  and/or Complexity of Data Reviewed Independent Historian: EMS    Details: hx External Data Reviewed: notes. Labs: ordered. Decision-making details documented in ED Course. Radiology: ordered and independent interpretation performed. Decision-making details documented in ED Course. ECG/medicine tests: ordered and independent interpretation performed. Decision-making details documented in ED Course. Discussion of management or test interpretation with external provider(s): neurology  Risk Prescription drug management. Parenteral controlled substances. Decision regarding hospitalization.   Iv ns. Continuous pulse ox and cardiac monitoring. Labs ordered/sent. Imaging ordered.   Differential diagnosis includes cva, dissection, ich, vertigo/central vs peripheral, etc. Dispo decision including potential need for admission considered - will get labs and imaging and reassess.   Reviewed nursing notes and prior charts for additional history. External reports reviewed. Additional history from: EMS.   Cardiac monitor: sinus rhythm, rate 72.  Labs reviewed/interpreted by me - chem largely unremarkable (glucose mildly elevated), wbc and hgb normal.   CT reviewed/interpreted by me - no hem.   Neurology was consulted emergently and present on pt arrival as code cva activation. After CT, neurology did give patient TNK.   Recheck pt, awake and alert. No immediate change noted from prior exam.   Plan for admission to neuro icu.   Additional imaging reviewed - right vertebral occlusion.   Pt notes persistence of same headache (I.e. not acutely worse after TNK) - requests pain medication - morphine iv.   Ativan, small dose, to help w dizziness.   CRITICAL CARE RE: acute cva,  vertebral artery occlusion,  tnk therapy Performed by: Suzi Roots Total critical care time: 45 minutes Critical care time was exclusive of separately billable procedures and treating other patients. Critical care was  necessary to treat or prevent imminent or life-threatening deterioration. Critical care was time spent personally by me on the following activities: development of treatment plan with patient and/or surrogate as well as nursing, discussions with consultants, evaluation of patient's response to treatment, examination of patient, obtaining history from patient or surrogate, ordering and performing treatments and interventions, ordering and review of laboratory studies, ordering and review of radiographic studies, pulse oximetry and re-evaluation of patient's condition.              Final Clinical Impression(s) / ED Diagnoses Final diagnoses:  Acute CVA (cerebrovascular accident) (HCC)  Occlusion of right vertebral artery    Rx / DC Orders ED Discharge Orders     None         Cathren Laine, MD 03/01/24 [id number]

## 2024-03-01 NOTE — Progress Notes (Signed)
 Echocardiogram 2D Echocardiogram has been performed.  Warren Lacy Shriyan Arakawa RDCS 03/01/2024, 9:44 AM

## 2024-03-02 ENCOUNTER — Inpatient Hospital Stay (HOSPITAL_COMMUNITY)

## 2024-03-02 DIAGNOSIS — Z7901 Long term (current) use of anticoagulants: Secondary | ICD-10-CM | POA: Diagnosis not present

## 2024-03-02 DIAGNOSIS — I6302 Cerebral infarction due to thrombosis of basilar artery: Secondary | ICD-10-CM

## 2024-03-02 DIAGNOSIS — I6501 Occlusion and stenosis of right vertebral artery: Secondary | ICD-10-CM | POA: Diagnosis not present

## 2024-03-02 DIAGNOSIS — I6389 Other cerebral infarction: Secondary | ICD-10-CM | POA: Diagnosis not present

## 2024-03-02 DIAGNOSIS — I10 Essential (primary) hypertension: Secondary | ICD-10-CM | POA: Diagnosis not present

## 2024-03-02 LAB — LIPID PANEL
Cholesterol: 129 mg/dL (ref 0–200)
HDL: 49 mg/dL (ref >40)
LDL Cholesterol: 67 mg/dL (ref 0–99)
Total CHOL/HDL Ratio: 2.6 ratio
Triglycerides: 66 mg/dL (ref <150)
VLDL: 13 mg/dL (ref 0–40)

## 2024-03-02 MED ORDER — APIXABAN 5 MG PO TABS
5.0000 mg | ORAL_TABLET | Freq: Two times a day (BID) | ORAL | Status: DC
  Administered 2024-03-02: 5 mg via ORAL
  Filled 2024-03-02: qty 1

## 2024-03-02 MED ORDER — CHLORHEXIDINE GLUCONATE CLOTH 2 % EX PADS
6.0000 | MEDICATED_PAD | Freq: Every day | CUTANEOUS | Status: DC
  Administered 2024-03-01: 6 via TOPICAL

## 2024-03-02 MED ORDER — ATENOLOL 25 MG PO TABS
12.5000 mg | ORAL_TABLET | Freq: Once | ORAL | Status: AC
  Administered 2024-03-02: 12.5 mg via ORAL
  Filled 2024-03-02: qty 0.5

## 2024-03-02 MED ORDER — ACETAMINOPHEN 325 MG PO TABS
650.0000 mg | ORAL_TABLET | Freq: Four times a day (QID) | ORAL | Status: DC | PRN
Start: 2024-03-02 — End: 2024-03-02
  Administered 2024-03-02: 650 mg via ORAL
  Filled 2024-03-02: qty 2

## 2024-03-02 MED ORDER — LORAZEPAM 2 MG/ML IJ SOLN
2.0000 mg | Freq: Once | INTRAMUSCULAR | Status: AC
  Administered 2024-03-02: 2 mg via INTRAVENOUS
  Filled 2024-03-02: qty 1

## 2024-03-02 MED ORDER — APIXABAN 5 MG PO TABS: 5.0000 mg | ORAL_TABLET | Freq: Two times a day (BID) | ORAL | 1 refills | Status: DC

## 2024-03-02 MED ORDER — PANTOPRAZOLE SODIUM 40 MG PO TBEC: 40.0000 mg | DELAYED_RELEASE_TABLET | Freq: Every day | ORAL | Status: DC

## 2024-03-02 NOTE — Discharge Summary (Addendum)
 Stroke Discharge Summary  Patient ID: [name]   MRN: [id number]      DOB: [birth year]  Date of Admission: 03/01/2024 Date of Discharge: 03/02/2024  Attending Physician:  Delia Heady MD Consultant(s):    None  Patient's PCP:  [name], MD  DISCHARGE PRIMARY DIAGNOSIS: Right superior cerebellar infarct; cardioembolic in the setting of afib not on anticoagulation    Patient Active Problem List   Diagnosis Date Noted   Stroke (cerebrum) (HCC) 03/01/2024   Hypercoagulable state due to longstanding persistent atrial fibrillation (HCC) 01/24/2024   Symptoms of upper respiratory infection (URI) 02/13/2022   Essential hypertension 02/13/2022   Chest pain of uncertain etiology    CAD in native artery    DOE (dyspnea on exertion)    Atypical atrial flutter (HCC) 05/12/2020   Atrial fibrillation with RVR (HCC) 12/05/2018   Atrial fibrillation (HCC) 08/08/2017   Diverticular disease    Primary localized osteoarthritis of left knee    Spinal stenosis 11/19/2014   Diarrhea 07/26/2012    Allergies as of 03/02/2024       Reactions   Mushroom Swelling, Other (See Comments)   Causes tongue swelling that cuts of airway   Erythromycin Other (See Comments)   Abdominal cramps   Hydrocodone-acetaminophen Other (See Comments)   Makes him feel "high"   Mobic [meloxicam] Other (See Comments)   Lower back and kidney pain    Prednisone Hives, Anxiety, Other (See Comments)   Makes him anxious   Z-pak [azithromycin] Nausea Only, Other (See Comments)   Severe stomach cramps   Cefuroxime    worsening back pain?   Celecoxib    myalgias   Duloxetine Hcl    spacy   Lopressor [metoprolol Tartrate] Swelling   Metaxalone    Unknown   Amoxicillin Palpitations   Dilaudid [hydromorphone] Palpitations   Pt stated he is not allergic to this medication 03/25   Penicillins Rash, Other (See Comments)   Has patient had a PCN reaction causing immediate rash, facial/tongue/throat swelling, SOB  or lightheadedness with hypotension: No Has patient had a PCN reaction causing severe rash involving mucus membranes or skin necrosis: No Has patient had a PCN reaction that required hospitalization: No Has patient had a PCN reaction occurring within the last 10 years: No If all of the above answers are "NO", then may proceed with Cephalosporin use.        Medication List     TAKE these medications    acetaminophen 650 MG CR tablet Commonly known as: TYLENOL Take 1,300 mg by mouth 2 (two) times daily.   apixaban 5 MG Tabs tablet Commonly known as: ELIQUIS Take 1 tablet (5 mg total) by mouth 2 (two) times daily.   atenolol 50 MG tablet Commonly known as: TENORMIN Take 1 tablet (50 mg total) by mouth 2 (two) times daily.   COLACE PO Take 1 tablet by mouth daily.   diclofenac 50 MG EC tablet Commonly known as: VOLTAREN Take 50 mg by mouth 2 (two) times daily.   methocarbamol 500 MG tablet Commonly known as: ROBAXIN Take 500 mg by mouth every 8 (eight) hours as needed for muscle spasms.   multivitamin with minerals tablet Take 1 tablet by mouth daily.   oxyCODONE-acetaminophen 5-325 MG tablet Commonly known as: PERCOCET/ROXICET Take 1 tablet by mouth 2 (two) times daily.   sildenafil 20 MG tablet Commonly known as: REVATIO Take 20 mg by mouth daily as needed (ED).   traMADol  50 MG tablet Commonly known as: ULTRAM Take 100 mg by mouth 3 (three) times daily.        LABORATORY STUDIES CBC    Component Value Date/Time   WBC 9.6 03/01/2024 0745   RBC 4.84 03/01/2024 0745   HGB 15.3 03/01/2024 0746   HGB 16.6 09/20/2021 0728   HCT 45.0 03/01/2024 0746   HCT 48.0 09/20/2021 0728   PLT 183 03/01/2024 0745   PLT 221 09/20/2021 0728   MCV 93.2 03/01/2024 0745   MCV 92 09/20/2021 0728   MCH 31.2 03/01/2024 0745   MCHC 33.5 03/01/2024 0745   RDW 13.9 03/01/2024 0745   RDW 11.9 09/20/2021 0728   LYMPHSABS 2.0 03/01/2024 0745   LYMPHSABS 2.4 04/01/2020 0803    MONOABS 0.7 03/01/2024 0745   EOSABS 0.2 03/01/2024 0745   EOSABS 0.2 04/01/2020 0803   BASOSABS 0.1 03/01/2024 0745   BASOSABS 0.0 04/01/2020 0803   CMP    Component Value Date/Time   NA 141 03/01/2024 0746   NA 142 09/20/2021 0728   K 4.4 03/01/2024 0746   CL 106 03/01/2024 0746   CO2 24 03/01/2024 0745   GLUCOSE 159 (H) 03/01/2024 0746   BUN 17 03/01/2024 0746   BUN 15 09/20/2021 0728   CREATININE 0.90 03/01/2024 0746   CALCIUM 8.2 (L) 03/01/2024 0745   PROT 6.4 (L) 03/01/2024 0745   ALBUMIN 3.6 03/01/2024 0745   AST 36 03/01/2024 0745   ALT 40 03/01/2024 0745   ALKPHOS 55 03/01/2024 0745   BILITOT 0.3 03/01/2024 0745   GFRNONAA >60 03/01/2024 0745   GFRAA 96 04/01/2020 0803   COAGS Lab Results  Component Value Date   INR 0.9 03/01/2024   INR 0.95 11/18/2014   INR 1.0 10/31/2007   Lipid Panel    Component Value Date/Time   CHOL 129 03/02/2024 0541   TRIG 66 03/02/2024 0541   HDL 49 03/02/2024 0541   CHOLHDL 2.6 03/02/2024 0541   VLDL 13 03/02/2024 0541   LDLCALC 67 03/02/2024 0541   HgbA1C  Lab Results  Component Value Date   HGBA1C 5.4 03/01/2024   Alcohol Level    Component Value Date/Time   ETH <10 03/01/2024 0745     SIGNIFICANT DIAGNOSTIC STUDIES MR BRAIN WO CONTRAST Result Date: 03/02/2024 CLINICAL DATA:  63 year old male code stroke presentation yesterday. Right vertebral artery occlusion suspected on CTA. Status post T NK EXAM: MRI HEAD WITHOUT CONTRAST TECHNIQUE: Multiplanar, multiecho pulse sequences of the brain and surrounding structures were obtained without intravenous contrast. COMPARISON:  CT head and CTA head and neck yesterday. FINDINGS: Brain: Positive for patchy, confluent restricted diffusion in an area of 4 cm affecting the right superior cerebellar artery territory (series 13, image 49). T2 and FLAIR hyperintense cytotoxic edema there with petechial hemorrhage Heidelberg classification 1a: HI1, scattered small petechiae, no mass  effect (series 10, image 24). Mild mass effect in the region. Cerebral aqueduct and 4th ventricle appear to remain patent. Ventral basilar cisterns are patent and there is no tonsillar herniation. No other diffusion restriction. No midline shift, mass effect, evidence of mass lesion, ventriculomegaly, extra-axial collection. Cervicomedullary junction and pituitary are within normal limits. Scattered mild or at most moderate for age mostly subcortical cerebral white matter T2 and FLAIR hyperintensity in both cerebral hemispheres. Deep gray nuclei, brainstem and left cerebellar hemisphere peer negative. No other cerebral blood products identified on SWI. Vascular: Heterogeneous flow void of the distal right vertebral artery on series 8, image 32, but  not conspicuous on the other sequences and compatible with reconstituted flow as seen on the CTA yesterday. Skull and upper cervical spine: Negative. Visualized bone marrow signal is within normal limits. Sinuses/Orbits: Bilateral paranasal sinus mucosal thickening. Postoperative changes to the globes. Other: Mastoids well aerated. Grossly normal visible internal auditory structures. IMPRESSION: 1. Positive for Acute Right SCA cerebellar infarct with petechial hemorrhage (Heidelberg classification 1a: HI1), no significant mass effect at this time. 2. No other acute intracranial abnormality. 3. Mild to moderate for age nonspecific cerebral white matter signal changes. Electronically Signed   By: Odessa Fleming M.D.   On: 03/02/2024 08:44   ECHOCARDIOGRAM COMPLETE Result Date: 03/01/2024    ECHOCARDIOGRAM REPORT   Patient Name:   [name] Date of Exam: 03/01/2024 Medical Rec #:  [id number]        Height:       69.0 in Accession #:    [id number]       Weight:       244.7 lb Date of Birth:  28-Oct-1961        BSA:          2.251 m Patient Age:    62 years         BP:           126/79 mmHg Patient Gender: M                HR:           68 bpm. Exam Location:  Inpatient  Procedure: 2D Echo, Color Doppler and Cardiac Doppler (Both Spectral and Color            Flow Doppler were utilized during procedure). Indications:    Stroke i63.9  History:        Patient has prior history of Echocardiogram examinations, most                 recent 02/04/2021. CAD, Arrythmias:Atrial Fibrillation; Risk                 Factors:Hypertension.  Sonographer:    Irving Burton Senior RDCS Referring Phys: MCNEILL P KIRKPATRICK IMPRESSIONS  1. Left ventricular ejection fraction, by estimation, is 60 to 65%. The left ventricle has normal function. The left ventricle has no regional wall motion abnormalities. Left ventricular diastolic parameters were normal.  2. Right ventricular systolic function is normal. The right ventricular size is normal.  3. Left atrial size was severely dilated.  4. Right atrial size was moderately dilated.  5. The mitral valve is abnormal. Mild mitral valve regurgitation. No evidence of mitral stenosis.  6. The aortic valve is tricuspid. Aortic valve regurgitation is not visualized. No aortic stenosis is present.  7. The inferior vena cava is normal in size with greater than 50% respiratory variability, suggesting right atrial pressure of 3 mmHg. FINDINGS  Left Ventricle: Left ventricular ejection fraction, by estimation, is 60 to 65%. The left ventricle has normal function. The left ventricle has no regional wall motion abnormalities. Strain was performed and the global longitudinal strain is indeterminate. The left ventricular internal cavity size was normal in size. There is no left ventricular hypertrophy. Left ventricular diastolic parameters were normal. Right Ventricle: The right ventricular size is normal. No increase in right ventricular wall thickness. Right ventricular systolic function is normal. Left Atrium: Left atrial size was severely dilated. Right Atrium: Right atrial size was moderately dilated. Pericardium: There is no evidence of pericardial effusion. Mitral Valve: The  mitral valve is  abnormal. There is mild thickening of the mitral valve leaflet(s). Mild mitral annular calcification. Mild mitral valve regurgitation. No evidence of mitral valve stenosis. Tricuspid Valve: The tricuspid valve is normal in structure. Tricuspid valve regurgitation is mild . No evidence of tricuspid stenosis. Aortic Valve: The aortic valve is tricuspid. Aortic valve regurgitation is not visualized. No aortic stenosis is present. Pulmonic Valve: The pulmonic valve was normal in structure. Pulmonic valve regurgitation is not visualized. No evidence of pulmonic stenosis. Aorta: The aortic root is normal in size and structure. Venous: The inferior vena cava is normal in size with greater than 50% respiratory variability, suggesting right atrial pressure of 3 mmHg. IAS/Shunts: No atrial level shunt detected by color flow Doppler. Additional Comments: 3D was performed not requiring image post processing on an independent workstation and was indeterminate.  LEFT VENTRICLE PLAX 2D LVIDd:         5.70 cm   Diastology LVIDs:         3.70 cm   LV e' medial:    8.05 cm/s LV PW:         0.90 cm   LV E/e' medial:  13.7 LV IVS:        0.80 cm   LV e' lateral:   10.70 cm/s LVOT diam:     2.00 cm   LV E/e' lateral: 10.3 LV SV:         70 LV SV Index:   31 LVOT Area:     3.14 cm  RIGHT VENTRICLE RV S prime:     8.59 cm/s TAPSE (M-mode): 1.7 cm LEFT ATRIUM             Index        RIGHT ATRIUM           Index LA diam:        5.20 cm 2.31 cm/m   RA Area:     28.20 cm LA Vol (A2C):   57.3 ml 25.46 ml/m  RA Volume:   93.00 ml  41.32 ml/m LA Vol (A4C):   68.5 ml 30.43 ml/m LA Biplane Vol: 62.7 ml 27.85 ml/m  AORTIC VALVE LVOT Vmax:   94.20 cm/s LVOT Vmean:  65.000 cm/s LVOT VTI:    0.222 m  AORTA Ao Root diam: 3.20 cm Ao Asc diam:  3.20 cm MITRAL VALVE MV Area (PHT): 2.99 cm     SHUNTS MV Decel Time: 254 msec     Systemic VTI:  0.22 m MV E velocity: 110.00 cm/s  Systemic Diam: 2.00 cm Charlton Haws MD Electronically  signed by Charlton Haws MD Signature Date/Time: 03/01/2024/10:17:04 AM    Final    CT ANGIO HEAD NECK W WO CM (CODE STROKE) Result Date: 03/01/2024 CLINICAL DATA:  63 year old male code stroke presentation. EXAM: CT ANGIOGRAPHY HEAD AND NECK TECHNIQUE: Multidetector CT imaging of the head and neck was performed using the standard protocol during bolus administration of intravenous contrast. Multiplanar CT image reconstructions and MIPs were obtained to evaluate the vascular anatomy. Carotid stenosis measurements (when applicable) are obtained utilizing NASCET criteria, using the distal internal carotid diameter as the denominator. RADIATION DOSE REDUCTION: This exam was performed according to the departmental dose-optimization program which includes automated exposure control, adjustment of the mA and/or kV according to patient size and/or use of iterative reconstruction technique. CONTRAST:  75mL OMNIPAQUE IOHEXOL 350 MG/ML SOLN COMPARISON:  Plain head CT 0747 hours today. FINDINGS: CTA NECK Skeleton: Chronic C5-C6 and C6-C7 ACDF. No acute osseous  abnormality identified. Upper chest: Mild respiratory motion, otherwise negative. Other neck: Neck soft tissue spaces are within normal limits. Aortic arch: 4 vessel arch, left vertebral artery arises directly from the arch. Mild arch tortuosity but no atherosclerosis. Right carotid system: Negative through the distal right CCA. Soft and calcified plaque at the right ICA origin and bulb with no stenosis. Left carotid system: Negative through the mid left CCA. Distal left CCA anterior vessel wall soft plaque without stenosis. Soft and calcified plaque at the left ICA origin including bulky soft plaque at the bulb (series 9, image 171). Less than 50 % stenosis with respect to the distal vessel. Left ICA remains patent to the skull base. Vertebral arteries: Proximal right vertebral artery appears occluded, probably at its origin although detail is limited from lower cervical  ACDF streak artifact. There is minimal reconstituted enhancement in the right V2 segment. Moderate reconstituted enhancement in the V3 segment and distal right vertebral is patent. See below. Left vertebral artery arises directly from the arch. Left vertebral is patent to the skull base with no significant plaque or stenosis. CTA HEAD Posterior circulation: Left vertebral V4 segment is diminutive and irregular but patent to the vertebrobasilar junction with no focal stenosis. Reconstituted right V4 segment is moderately irregular and stenotic just before the vertebrobasilar junction on series 5, image 148 but remains patent. Right PICA appears to remain enhancing. Left PICA origin is patent. Basilar artery is patent with mild irregularity, no stenosis. Basilar tip, SCA and PCA origins are normal. Posterior communicating arteries are diminutive or absent. Bilateral PCA branches are within normal limits. Anterior circulation: Both ICA siphons are patent. Mild to moderate left and mild right siphon calcified atherosclerosis with no significant stenosis. Patent carotid termini. Patent MCA and ACA origins. Normal anterior communicating artery. Bilateral ACA branches are within normal limits. Left MCA M1 segment and bifurcation are patent without stenosis. Right MCA M1 segment and bifurcation are patent without stenosis. Bilateral MCA branches are within normal limits. Venous sinuses: Patent. Anatomic variants: Left vertebral artery arises directly from the arch. Review of the MIP images confirms the above findings IMPRESSION: 1. Positive for Occluded proximal Right Vertebral Artery, probably at its origin (ACDF streak artifact). Reconstituted by the V3 segments. Right V4 is patent with moderate stenosis. Right PICA origin is patent. 2. Negative for intracranial large vessel occlusion. Carotid atherosclerosis in the neck and at the skull base but no hemodynamically significant stenosis. 3. C5-C6 and C6-C7 ACDF. #1 was  communicated to Dr. Amada Jupiter at 8:15 am on 03/01/2024 by text page via the [facility] messaging system. Electronically Signed   By: Odessa Fleming M.D.   On: 03/01/2024 08:16   CT HEAD CODE STROKE WO CONTRAST Result Date: 03/01/2024 CLINICAL DATA:  Code stroke.  63 year old male. EXAM: CT HEAD WITHOUT CONTRAST TECHNIQUE: Contiguous axial images were obtained from the base of the skull through the vertex without intravenous contrast. RADIATION DOSE REDUCTION: This exam was performed according to the departmental dose-optimization program which includes automated exposure control, adjustment of the mA and/or kV according to patient size and/or use of iterative reconstruction technique. COMPARISON:  None Available. FINDINGS: Brain: Cerebral volume is within normal limits for age. No midline shift, ventriculomegaly, mass effect, evidence of mass lesion, intracranial hemorrhage or evidence of cortically based acute infarction. Gray-white matter differentiation is within normal limits throughout the brain. Vascular: No suspicious intracranial vascular hyperdensity. Skull: Intact.  No acute osseous abnormality identified. Sinuses/Orbits: Bilateral maxillary sinus mucosal thickening and bubbly opacity  appears inflammatory. Other Visualized paranasal sinuses and mastoids are well aerated. Other: No gaze deviation. No acute orbit or scalp soft tissue finding. ASPECTS [facility] Stroke Program Early CT Score) Total score (0-10 with 10 being normal): 10 IMPRESSION: 1. Normal for age noncontrast CT appearance of the brain. ASPECTS 10. 2. Bilateral maxillary sinus inflammation. 3. These results were communicated to Dr. Amada Jupiter at 7:52 am on 03/01/2024 by text page via the [facility] messaging system. Electronically Signed   By: Odessa Fleming M.D.   On: 03/01/2024 07:52       HISTORY OF PRESENT ILLNESS 63 y.o. patient with history of history of hypertension, atrial fibrillation status post ablations who presents with dizziness that started  abruptly 3/7.  NIH on Admission 3 patient was treated with IV TNK after discussion of risk benefits and alternatives with the patient and showed good neurological recovery.  He was monitored in the ICU and blood pressure was tightly controlled.  Follow-up brain imaging with MRI confirmed right superior cerebral artery branch infarct with petechial hemorrhagic transformation.  Patient neurological exam showed no residual deficits at the time of discharge.  Risk benefits and alternative to anticoagulation with Eliquis were discussed with the patient and he was agreeable to start Eliquis.  He was advised to follow-up in the A-fib clinic for ongoing monitoring for his A-fib and Eliquis.  HOSPITAL COURSE Acute Ischemic Infarct:  right cerebellar infarct s/p TNK Etiology:  Embolic   Code Stroke CT head No acute abnormality. Small vessel disease. Atrophy. ASPECTS 10.    CTA head & neck Positive for Occluded proximal Right Vertebral Artery, probably at its origin (ACDF streak artifact). Reconstituted by the V3 segments. Right V4 is patent with moderate stenosis. Right PICA origin is patent. MRI  Positive for Acute Right SCA cerebellar infarct with petechial hemorrhage (Heidelberg classification 1a: HI1), no significant mass effect at this time. 2D Echo EF 60-65%  LDL 67 HgbA1c 5.4 VTE prophylaxis - SCDs No antithrombotic prior to admission, now on Eliquis (apixaban) daily Will need to start eliquis  Therapy recommendations:  No follow up needed  Disposition:  Home    Atrial fibrillation Home Meds: None Continue telemetry monitoring Begin anticoagulation with Eliquis Follow up with Afib clinic   Hypertension PACs Home meds:  Atenolol  Stable Blood Pressure Goal: BP less than 180/105    DISCHARGE EXAM  PHYSICAL EXAM General:  Alert, well-nourished, well-developed patient in no acute distress Psych:  Mood and affect appropriate for situation CV: Regular rate and rhythm on monitor Respiratory:   Regular, unlabored respirations on room air GI: Abdomen soft and nontender     NEURO:  Mental Status: AA&Ox3, patient is able to give clear and coherent history Speech/Language: speech is without dysarthria or aphasia.  Naming, repetition, fluency, and comprehension intact.   Cranial Nerves:  II: PERRL. Visual fields full.  III, IV, VI: EOMI. Eyelids elevate symmetrically.  V: Sensation is intact to light touch and symmetrical to face.  VII: Face is symmetrical resting and smiling VIII: hearing intact to voice. IX, X: Palate elevates symmetrically. Phonation is normal.  UJ:[id number] shrug 5/5. XII: tongue is midline without fasciculations. Motor: 5/5 strength to all muscle groups tested.  Tone: is normal and bulk is normal Sensation- Intact to light touch bilaterally. Extinction absent to light touch to DSS.   Coordination: FTN intact bilaterally, HKS: no ataxia in BLE.No drift.  Gait- deferred  1a Level of Conscious.: 0 1b LOC Questions: 0 1c LOC Commands: 0 2 Best  Gaze: 0 3 Visual: 0 4 Facial Palsy: 0 5a Motor Arm - left: 0 5b Motor Arm - Right: 0 6a Motor Leg - Left: 0 6b Motor Leg - Right: 0 7 Limb Ataxia: 0 8 Sensory: 0 9 Best Language: 0 10 Dysarthria: 0 11 Extinct. and Inatten.: 0 TOTAL: 0   Discharge Diet       Diet   Diet Heart Room service appropriate? Yes; Fluid consistency: Thin   liquids  DISCHARGE PLAN Disposition: Home Eliquis (apixaban) daily for secondary stroke prevention  Ongoing stroke risk factor control by Primary Care Physician at time of discharge Follow-up PCP [name], MD in 2 weeks. Follow up with Afib clinic  Follow-up in [facility] in 8 weeks, office to schedule an appointment. Able to see NP in clinic.  33 minutes were spent preparing discharge.  Patient seen and examined by NP/APP with MD. MD to update note as needed.   Elmer Picker, DNP, FNP-BC [facility] Pager: [phone] This patient is critically ill and at significant risk of neurological worsening, death and care requires constant monitoring of vital signs, hemodynamics,respiratory and cardiac monitoring, extensive review of multiple databases, frequent neurological assessment, discussion with family, other specialists and medical decision making of high complexity.I have made any additions or clarifications directly to the above note.This critical care time does not reflect procedure time, or teaching time or supervisory time of PA/NP/Med Resident etc but could involve care discussion time.  I spent 30 minutes of neurocritical care time  in the care of  this patient.    Delia Heady, MD

## 2024-03-02 NOTE — Progress Notes (Signed)
 2100: Dr. Otelia Limes notified of patient's frequent PACs and PVCs. Patient requesting home medication atenolol; per patient they are prescribed for PVCs. Atenolol order placed by Dr. Otelia Limes.   0010: Dr. Otelia Limes notified that patient stated he will not go to MRI without something for anxiety/sedation. Dr. Otelia Limes placed order for 2 mg ativan injection for MRI.

## 2024-03-02 NOTE — Progress Notes (Signed)
 Patient belongings returned to patient. AVS instructions reviewed with patient and wife. Patient verbalized understanding of instructions with no questions for this RN. Pt discharged home via private vehicle with wife. Vital signs and assessment stable at discharge.   03/02/2024 Oralia Manis, RN, BSN 1:04 PM

## 2024-03-02 NOTE — Discharge Instructions (Signed)

## 2024-03-02 NOTE — Evaluation (Signed)
 Physical Therapy Evaluation Patient Details Name: [name] MRN: [id number] DOB: October 25, 1961 Today's Date: 03/02/2024  History of Present Illness  Patient is a 63 yo male presenting with dizziness and R sided headache on 03/01/24. CTA negative for hemorrhage, TNK administered on 3/7. MRI showing: Acute Right SCA cerebellar infarct with petechial hemorrhage no significant mass effect. PMH of A- Fib, HTN and chronic pain  Clinical Impression   Patient evaluated by Physical Therapy with no further acute PT needs identified. Pt reports he feels close to his functional baseline. Pt ambulating 540 ft with no assistive device and negotiated stairs without physical difficulty. Demonstrates mildly antalgic gait, which he reports is due to leg length discrepancy and chronic back pain. Reviewed physical activity recommendations and BEFAST stroke signs/symptoms. All education has been completed and the patient has no further questions. No follow-up Physical Therapy or equipment needs. PT is signing off. Thank you for this referral.             Equipment Recommendations None recommended by PT  Recommendations for Other Services       Functional Status Assessment Patient has not had a recent decline in their functional status     Precautions / Restrictions Precautions Precautions: Fall Restrictions Weight Bearing Restrictions Per Provider Order: No      Mobility  Bed Mobility Overal bed mobility: Needs Assistance             General bed mobility comments: sitting EOB upon arrival, was able to get there without assist    Transfers Overall transfer level: Independent Equipment used: None                    Ambulation/Gait Ambulation/Gait assistance: Modified independent (Device/Increase time) Gait Distance (Feet): 540 Feet Assistive device: None, IV Pole Gait Pattern/deviations: Step-through pattern, Decreased stride length, Antalgic Gait velocity: decreased      General Gait Details: Mildly antalgic (pt reports leg length discrepancy and chronic back pain), intermittent use of IV pole, no gross unsteadiness  Stairs Stairs: Yes Stairs assistance: Modified independent (Device/Increase time) Stair Management: One rail Right Number of Stairs: 3    Wheelchair Mobility     Tilt Bed    Modified Rankin (Stroke Patients Only) Modified Rankin (Stroke Patients Only) Pre-Morbid Rankin Score: No significant disability Modified Rankin: No significant disability     Balance Overall balance assessment: Mild deficits observed, not formally tested                                           Pertinent Vitals/Pain Pain Assessment Pain Assessment: Faces Faces Pain Scale: Hurts a little bit Pain Location: low back (chronic) Pain Descriptors / Indicators: Discomfort Pain Intervention(s): Monitored during session    Home Living Family/patient expects to be discharged to:: Private residence Living Arrangements: Spouse/significant other Available Help at Discharge: Available 24 hours/day Type of Home: House Home Access: Stairs to enter Entrance Stairs-Rails: Left Entrance Stairs-Number of Steps: 3   Home Layout: One level Home Equipment: Agricultural consultant (2 wheels);Cane - single point;Cane - quad;BSC/3in1      Prior Function Prior Level of Function : Working/employed;Driving;Independent/Modified Independent             Mobility Comments: independent ADLs Comments: independent, has his own company, drives     Extremity/Trunk Assessment   Upper Extremity Assessment Upper Extremity Assessment: Defer to OT evaluation RUE  Deficits / Details: states he feels a little weaker in his RUE, exercises reviewed for [facility] RUE Sensation: WNL RUE Coordination: decreased fine motor;decreased gross motor    Lower Extremity Assessment Lower Extremity Assessment: RLE deficits/detail;LLE deficits/detail RLE Deficits / Details:  Strength 5/5 LLE Deficits / Details: Strength 5/5    Cervical / Trunk Assessment Cervical / Trunk Assessment: Normal  Communication   Communication Communication: No apparent difficulties    Cognition Arousal: Alert Behavior During Therapy: WFL for tasks assessed/performed   PT - Cognitive impairments: No apparent impairments                         Following commands: Intact       Cueing Cueing Techniques: Verbal cues     General Comments General comments (skin integrity, edema, etc.): VSS on RA    Exercises     Assessment/Plan    PT Assessment Patient does not need any further PT services  PT Problem List         PT Treatment Interventions      PT Goals (Current goals can be found in the Care Plan section)  Acute Rehab PT Goals Patient Stated Goal: go home PT Goal Formulation: All assessment and education complete, DC therapy    Frequency       Co-evaluation               AM-PAC PT "6 Clicks" Mobility  Outcome Measure Help needed turning from your back to your side while in a flat bed without using bedrails?: None Help needed moving from lying on your back to sitting on the side of a flat bed without using bedrails?: None Help needed moving to and from a bed to a chair (including a wheelchair)?: None Help needed standing up from a chair using your arms (e.g., wheelchair or bedside chair)?: None Help needed to walk in hospital room?: None Help needed climbing 3-5 steps with a railing? : None 6 Click Score: 24    End of Session Equipment Utilized During Treatment: Gait belt Activity Tolerance: Patient tolerated treatment well Patient left: in chair;with call bell/phone within reach Nurse Communication: Mobility status PT Visit Diagnosis: Unsteadiness on feet (R26.81)    Time: 1610-9604 PT Time Calculation (min) (ACUTE ONLY): 15 min   Charges:   PT Evaluation $PT Eval Low Complexity: 1 Low   PT General Charges $$ ACUTE PT  VISIT: 1 Visit         Lillia Pauls, PT, DPT Acute Rehabilitation Services Office [phone]   Norval Morton 03/02/2024, 1:27 PM

## 2024-03-02 NOTE — Progress Notes (Signed)
 RN called MRI at 0138 in attempt to get patient down there. MRI personnel stated they are unable to take patient at this time. MRI personnel stated they would call RN or secure chat RN when able to have patient come down.

## 2024-03-02 NOTE — Progress Notes (Signed)
 PHARMACY - ANTICOAGULATION CONSULT NOTE  Pharmacy Consult:  Eliquis Indication: atrial fibrillation  Allergies  Allergen Reactions   Mushroom Swelling and Other (See Comments)    Causes tongue swelling that cuts of airway   Erythromycin Other (See Comments)    Abdominal cramps    Hydrocodone-Acetaminophen Other (See Comments)    Makes him feel "high"   Mobic [Meloxicam] Other (See Comments)    Lower back and kidney pain    Prednisone Hives, Anxiety and Other (See Comments)    Makes him anxious   Z-Pak [Azithromycin] Nausea Only and Other (See Comments)    Severe stomach cramps    Cefuroxime     worsening back pain?   Celecoxib     myalgias   Duloxetine Hcl     spacy   Lopressor [Metoprolol Tartrate] Swelling   Metaxalone     Unknown   Amoxicillin Palpitations   Dilaudid [Hydromorphone] Palpitations    Pt stated he is not allergic to this medication 03/25   Penicillins Rash and Other (See Comments)    Has patient had a PCN reaction causing immediate rash, facial/tongue/throat swelling, SOB or lightheadedness with hypotension: No Has patient had a PCN reaction causing severe rash involving mucus membranes or skin necrosis: No Has patient had a PCN reaction that required hospitalization: No Has patient had a PCN reaction occurring within the last 10 years: No If all of the above answers are "NO", then may proceed with Cephalosporin use.      Patient Measurements: Weight: 111 kg (244 lb 11.4 oz)  Vital Signs: Temp: 98 F (36.7 C) (03/08 0800) Temp Source: Oral (03/08 0800) BP: 103/54 (03/08 1000) Pulse Rate: 79 (03/08 1000)  Labs: Recent Labs    03/01/24 0745 03/01/24 0746  HGB 15.1 15.3  HCT 45.1 45.0  PLT 183  --   APTT 24  --   LABPROT 12.8  --   INR 0.9  --   CREATININE 0.90 0.90    Estimated Creatinine Clearance: 103.1 mL/min (by C-G formula based on SCr of 0.9 mg/dL).   Medical History: Past Medical History:  Diagnosis Date   Arthritis     Atrial flutter (HCC)    diagnosed 2005   Depression    Diverticular disease    GERD (gastroesophageal reflux disease)    Obesity    Paroxysmal atrial fibrillation (HCC)    Primary localized osteoarthritis of left knee      Assessment: 33 YOM with history of Afib not on AC PTA due to patient's preference per Cards' outpatient note.  He presented with a new stroke.  MRI showed petechial hemorrhage.  Pharmacy consulted to start Eliquis.  Patient qualifies for full Eliquis dosing given age < 80, SCr < 1.5 and weight > 60kg.  CBC stable and TNK was > 24 hours ago.  Goal of Therapy:  Appropriate anticoagulation Monitor platelets by anticoagulation protocol: Yes   Plan:  Start Eliquis 5mg  PO BID Pharmacy will sign off and follow peripherally.  Thank you for the consult!  Hanan Moen D. Laney Potash, PharmD, BCPS, BCCCP 03/02/2024, 11:12 AM

## 2024-03-02 NOTE — Progress Notes (Signed)
 Dr. Otelia Limes notified that patient stated that subjectively he feels like he needs another dose of atenolol. Patient is still having ectopy, although it seems to be less than before. Awaiting new orders.

## 2024-03-02 NOTE — Progress Notes (Signed)
 RN called MRI and spoke with MRI personnel about bringing patient down. MRI staff with whom this RN spoke to stated that "it will be done on first shift". When questioned if they are busy right now by this RN, MRI staff said yes. RN requested that MRI staff please call if they have an opportunity where they are not busy and can take this patient.

## 2024-03-02 NOTE — Evaluation (Signed)
 Occupational Therapy Evaluation Patient Details Name: [name] MRN: [id number] DOB: 03/01/1961 Today's Date: 03/02/2024   History of Present Illness   Patient is a 63 yo male presenting with dizziness and R sided headache on 03/01/24. CTA negative for hemorrhage, TNK administered on 3/7. MRI showing: Acute Right SCA cerebellar infarct with petechial hemorrhage no significant mass effect. PMH of A- Fib, HTN and chronic pain     Clinical Impressions Prior to this admission, patient working at his company part time, lives with his wife, and fully independent without AD and driving. Currently patient reporting minimal weakness in RUE, but able to complete ADLs and bed mobility at [facility]. Patient with no visual deficits or cognitive deficits. Education provided to patient on deferring to neurology clearance for return back to driving, and OT providing strategies to re-gain RUE strength (no overt strength deficits noted when completing assessment). All questions answered at end of session, and handed off to PT for assessment. Patient requires no further OT needs at this time, with OT signing off. Please re-consult if further needs arise.     If plan is discharge home, recommend the following:   Help with stairs or ramp for entrance;Assist for transportation (initially)     Functional Status Assessment   Patient has had a recent decline in their functional status and demonstrates the ability to make significant improvements in function in a reasonable and predictable amount of time.     Equipment Recommendations   None recommended by OT     Recommendations for Other Services         Precautions/Restrictions   Precautions Precautions: Fall Restrictions Weight Bearing Restrictions Per Provider Order: No     Mobility Bed Mobility Overal bed mobility: Needs Assistance             General bed mobility comments: sitting EOB upon arrival, was able to get there without  assist    Transfers Overall transfer level: Needs assistance                 General transfer comment: defer to PT evaluation      Balance Overall balance assessment: Mild deficits observed, not formally tested                                         ADL either performed or assessed with clinical judgement   ADL Overall ADL's : Needs assistance/impaired Eating/Feeding: Set up;Sitting   Grooming: Set up;Sitting   Upper Body Bathing: Set up;Sitting   Lower Body Bathing: Set up;Sitting/lateral leans   Upper Body Dressing : Set up;Sitting   Lower Body Dressing: Contact guard assist;Sitting/lateral leans;Sit to/from stand   Toilet Transfer: Geophysicist/field seismologist Details (indicate cue type and reason): simulated Toileting- Clothing Manipulation and Hygiene: Contact guard assist;Sitting/lateral lean;Sit to/from stand       Functional mobility during ADLs: Contact guard assist;Cueing for sequencing;Cueing for safety General ADL Comments: Prior to this admission, patient working at his company part time, lives with his wife, and fully independent without AD and driving. Currently patient reporting minimal weakness in RUE, but able to complete ADLs and bed mobility at [facility]. Patient with no visual deficits or cognitive deficits. Education provided to patient on deferring to neurology clearance for return back to driving, as well as strategies to re-gain RUE strength. All questions answered at end of session, and handed off to  PT for assessment. Patient requires no further OT needs at this time, with OT signing off. Please re-consult if further needs arise.     Vision Baseline Vision/History: 0 No visual deficits Ability to See in Adequate Light: 0 Adequate Patient Visual Report: No change from baseline Vision Assessment?: Yes Eye Alignment: Within Functional Limits Ocular Range of Motion: Within Functional  Limits Alignment/Gaze Preference: Within Defined Limits Tracking/Visual Pursuits: Able to track stimulus in all quads without difficulty Saccades: Within functional limits Convergence: Within functional limits Visual Fields: No apparent deficits     Perception Perception: Within Functional Limits       Praxis Praxis: WFL       Pertinent Vitals/Pain Pain Assessment Pain Assessment: No/denies pain     Extremity/Trunk Assessment Upper Extremity Assessment Upper Extremity Assessment: Right hand dominant;RUE deficits/detail RUE Deficits / Details: states he feels a little weaker in his RUE, exercises reviewed for [facility] RUE Sensation: WNL RUE Coordination: decreased fine motor;decreased gross motor   Lower Extremity Assessment Lower Extremity Assessment: Defer to PT evaluation   Cervical / Trunk Assessment Cervical / Trunk Assessment: Normal   Communication Communication Communication: No apparent difficulties   Cognition Arousal: Alert Behavior During Therapy: WFL for tasks assessed/performed Cognition: No apparent impairments             OT - Cognition Comments: able to count backwards from 20 by 2 without issue                 Following commands: Intact       Cueing  General Comments   Cueing Techniques: Verbal cues  VSS on RA   Exercises     Shoulder Instructions      Home Living Family/patient expects to be discharged to:: Private residence Living Arrangements: Spouse/significant other Available Help at Discharge: Available 24 hours/day Type of Home: House Home Access: Stairs to enter [employer] of Steps: 3 Entrance Stairs-Rails: Left Home Layout: One level     Bathroom Shower/Tub: Producer, television/film/video: Standard     Home Equipment: Agricultural consultant (2 wheels);Cane - single point;Cane - quad;BSC/3in1          Prior Functioning/Environment Prior Level of Function :  Working/employed;Driving;Independent/Modified Independent             Mobility Comments: independent ADLs Comments: independent, has his own company, drives    OT Problem List: Decreased strength;Decreased activity tolerance   OT Treatment/Interventions:        OT Goals(Current goals can be found in the care plan section)   Acute Rehab OT Goals Patient Stated Goal: to get some sleep at home OT Goal Formulation: With patient Time For Goal Achievement: 03/16/24 Potential to Achieve Goals: Good   OT Frequency:       Co-evaluation              AM-PAC OT "6 Clicks" Daily Activity     Outcome Measure Help from another person eating meals?: A Little Help from another person taking care of personal grooming?: A Little Help from another person toileting, which includes using toliet, bedpan, or urinal?: A Little Help from another person bathing (including washing, rinsing, drying)?: A Little Help from another person to put on and taking off regular upper body clothing?: A Little Help from another person to put on and taking off regular lower body clothing?: A Little 6 Click Score: 18   End of Session Nurse Communication: Mobility status  Activity Tolerance: Patient tolerated  treatment well Patient left: in bed;with call bell/phone within reach;with nursing/sitter in room;Other (comment) (Handed off to PT sitting EOB)  OT Visit Diagnosis: Unsteadiness on feet (R26.81)                Time: 3235-5732 OT Time Calculation (min): 12 min Charges:  OT General Charges $OT Visit: 1 Visit OT Evaluation $OT Eval Moderate Complexity: 1 Mod  Pollyann Glen E. Brently Voorhis, OTR/L Acute Rehabilitation Services [phone]   Cherlyn Cushing 03/02/2024, 10:50 AM

## 2024-03-08 ENCOUNTER — Emergency Department (HOSPITAL_COMMUNITY)

## 2024-03-08 ENCOUNTER — Telehealth (HOSPITAL_COMMUNITY): Payer: Self-pay | Admitting: Pharmacy Technician

## 2024-03-08 ENCOUNTER — Emergency Department (HOSPITAL_COMMUNITY)
Admission: EM | Admit: 2024-03-08 | Discharge: 2024-03-08 | Disposition: A | Discharge status: Home / Self Care | Attending: Student | Admitting: Student

## 2024-03-08 ENCOUNTER — Encounter (HOSPITAL_COMMUNITY): Payer: Self-pay

## 2024-03-08 ENCOUNTER — Other Ambulatory Visit (HOSPITAL_COMMUNITY): Payer: Self-pay

## 2024-03-08 ENCOUNTER — Other Ambulatory Visit: Payer: Self-pay

## 2024-03-08 DIAGNOSIS — Z87891 Personal history of nicotine dependence: Secondary | ICD-10-CM | POA: Diagnosis not present

## 2024-03-08 DIAGNOSIS — Z79899 Other long term (current) drug therapy: Secondary | ICD-10-CM | POA: Diagnosis not present

## 2024-03-08 DIAGNOSIS — Z7901 Long term (current) use of anticoagulants: Secondary | ICD-10-CM | POA: Insufficient documentation

## 2024-03-08 DIAGNOSIS — Z8673 Personal history of transient ischemic attack (TIA), and cerebral infarction without residual deficits: Secondary | ICD-10-CM | POA: Insufficient documentation

## 2024-03-08 DIAGNOSIS — I2510 Atherosclerotic heart disease of native coronary artery without angina pectoris: Secondary | ICD-10-CM | POA: Diagnosis not present

## 2024-03-08 DIAGNOSIS — I10 Essential (primary) hypertension: Secondary | ICD-10-CM | POA: Insufficient documentation

## 2024-03-08 DIAGNOSIS — R519 Headache, unspecified: Secondary | ICD-10-CM | POA: Diagnosis not present

## 2024-03-08 DIAGNOSIS — R42 Dizziness and giddiness: Secondary | ICD-10-CM | POA: Insufficient documentation

## 2024-03-08 DIAGNOSIS — I639 Cerebral infarction, unspecified: Secondary | ICD-10-CM | POA: Diagnosis not present

## 2024-03-08 LAB — CBC
HCT: 43.7 % (ref 39.0–52.0)
Hemoglobin: 14.5 g/dL (ref 13.0–17.0)
MCH: 31.3 pg (ref 26.0–34.0)
MCHC: 33.2 g/dL (ref 30.0–36.0)
MCV: 94.4 fL (ref 80.0–100.0)
Platelets: 204 10*3/uL (ref 150–400)
RBC: 4.63 MIL/uL (ref 4.22–5.81)
RDW: 13.1 % (ref 11.5–15.5)
WBC: 8.7 10*3/uL (ref 4.0–10.5)
nRBC: 0 % (ref 0.0–0.2)

## 2024-03-08 LAB — URINALYSIS, ROUTINE W REFLEX MICROSCOPIC
Bilirubin Urine: NEGATIVE
Glucose, UA: NEGATIVE mg/dL
Ketones, ur: NEGATIVE mg/dL
Leukocytes,Ua: NEGATIVE
Nitrite: NEGATIVE
Protein, ur: NEGATIVE mg/dL
Specific Gravity, Urine: <1.005 — ABNORMAL LOW (ref 1.005–1.030)
pH: 6 (ref 5.0–8.0)

## 2024-03-08 LAB — DIFFERENTIAL
Abs Immature Granulocytes: 0.03 10*3/uL (ref 0.00–0.07)
Basophils Absolute: 0.1 10*3/uL (ref 0.0–0.1)
Basophils Relative: 1 %
Eosinophils Absolute: 0.1 10*3/uL (ref 0.0–0.5)
Eosinophils Relative: 1 %
Immature Granulocytes: 0 %
Lymphocytes Relative: 16 %
Lymphs Abs: 1.4 10*3/uL (ref 0.7–4.0)
Monocytes Absolute: 0.5 10*3/uL (ref 0.1–1.0)
Monocytes Relative: 5 %
Neutro Abs: 6.6 10*3/uL (ref 1.7–7.7)
Neutrophils Relative %: 77 %

## 2024-03-08 LAB — COMPREHENSIVE METABOLIC PANEL
ALT: 47 U/L — ABNORMAL HIGH (ref 0–44)
AST: 31 U/L (ref 15–41)
Albumin: 3.6 g/dL (ref 3.5–5.0)
Alkaline Phosphatase: 55 U/L (ref 38–126)
Anion gap: 10 (ref 5–15)
BUN: 17 mg/dL (ref 8–23)
CO2: 21 mmol/L — ABNORMAL LOW (ref 22–32)
Calcium: 9.1 mg/dL (ref 8.9–10.3)
Chloride: 108 mmol/L (ref 98–111)
Creatinine, Ser: 0.78 mg/dL (ref 0.61–1.24)
GFR, Estimated: >60 mL/min (ref >60)
Glucose, Bld: 129 mg/dL — ABNORMAL HIGH (ref 70–99)
Potassium: 4.5 mmol/L (ref 3.5–5.1)
Sodium: 139 mmol/L (ref 135–145)
Total Bilirubin: 0.7 mg/dL (ref 0.0–1.2)
Total Protein: 6.9 g/dL (ref 6.5–8.1)

## 2024-03-08 LAB — I-STAT CHEM 8, ED
BUN: 20 mg/dL (ref 8–23)
Calcium, Ion: 1.13 mmol/L — ABNORMAL LOW (ref 1.15–1.40)
Chloride: 106 mmol/L (ref 98–111)
Creatinine, Ser: 0.9 mg/dL (ref 0.61–1.24)
Glucose, Bld: 124 mg/dL — ABNORMAL HIGH (ref 70–99)
HCT: 42 % (ref 39.0–52.0)
Hemoglobin: 14.3 g/dL (ref 13.0–17.0)
Potassium: 4.3 mmol/L (ref 3.5–5.1)
Sodium: 139 mmol/L (ref 135–145)
TCO2: 24 mmol/L (ref 22–32)

## 2024-03-08 LAB — ETHANOL: Alcohol, Ethyl (B): <10 mg/dL (ref <10)

## 2024-03-08 LAB — RAPID URINE DRUG SCREEN, HOSP PERFORMED
Amphetamines: NOT DETECTED
Barbiturates: NOT DETECTED
Benzodiazepines: NOT DETECTED
Cocaine: NOT DETECTED
Opiates: NOT DETECTED
Tetrahydrocannabinol: NOT DETECTED

## 2024-03-08 LAB — URINALYSIS, MICROSCOPIC (REFLEX)

## 2024-03-08 LAB — APTT: aPTT: 34 s (ref 24–36)

## 2024-03-08 LAB — PROTIME-INR
INR: 1.1 (ref 0.8–1.2)
Prothrombin Time: 14.7 s (ref 11.4–15.2)

## 2024-03-08 LAB — CBG MONITORING, ED: Glucose-Capillary: 141 mg/dL — ABNORMAL HIGH (ref 70–99)

## 2024-03-08 MED ORDER — IOHEXOL 350 MG/ML SOLN
75.0000 mL | Freq: Once | INTRAVENOUS | Status: AC | PRN
  Administered 2024-03-08: 75 mL via INTRAVENOUS

## 2024-03-08 MED ORDER — LORAZEPAM 2 MG/ML IJ SOLN
1.0000 mg | Freq: Once | INTRAMUSCULAR | Status: AC
Start: 2024-03-08 — End: 2024-03-08
  Administered 2024-03-08: 1 mg via INTRAVENOUS
  Filled 2024-03-08: qty 1

## 2024-03-08 NOTE — ED Provider Notes (Signed)
 [place] EMERGENCY DEPARTMENT AT [facility] Provider Note  CSN: [id number] Arrival date & time: 03/08/24 0820  Chief Complaint(s) No chief complaint on file.  HPI [name] is a 63 y.o. 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  States that symptoms began abruptly at 430 this morning with global headache and vertigo.  States that symptoms are similar to his previous stroke but significantly less in intensity.  Denies associated weakness or numbness of the upper or lower extremities.  Denies chest pain, shortness of breath, abdominal pain, nausea, vomiting or other systemic symptoms.   Past Medical History Past Medical History:  Diagnosis Date   Arthritis    Atrial flutter (HCC)    diagnosed 2005   Depression    Diverticular disease    GERD (gastroesophageal reflux disease)    Obesity    Paroxysmal atrial fibrillation (HCC)    Primary localized osteoarthritis of left knee    Patient Active Problem List   Diagnosis Date Noted   Stroke (cerebrum) (HCC) 03/01/2024   Hypercoagulable state due to longstanding persistent atrial fibrillation (HCC) 01/24/2024   Symptoms of upper respiratory infection (URI) 02/13/2022   Essential hypertension 02/13/2022   Chest pain of uncertain etiology    CAD in native artery    DOE (dyspnea on exertion)    Atypical atrial flutter (HCC) 05/12/2020   Atrial fibrillation with RVR (HCC) 12/05/2018   Atrial fibrillation (HCC) 08/08/2017   Diverticular disease    Primary localized osteoarthritis of left knee    Spinal stenosis 11/19/2014   Diarrhea 07/26/2012   Home Medication(s) Prior to Admission medications   Medication Sig Start Date End Date Taking? Authorizing Provider  acetaminophen (TYLENOL) 650 MG CR tablet Take  1,300 mg by mouth 2 (two) times daily.    [provider]  apixaban (ELIQUIS) 5 MG TABS tablet Take 1 tablet (5 mg total) by mouth 2 (two) times daily. 03/02/24   Elmer Picker, NP  atenolol (TENORMIN) 50 MG tablet Take 1 tablet (50 mg total) by mouth 2 (two) times daily. 01/24/24   Fenton, Clint R, PA  diclofenac (VOLTAREN) 50 MG EC tablet Take 50 mg by mouth 2 (two) times daily.    [provider]  Docusate Sodium (COLACE PO) Take 1 tablet by mouth daily. Patient not taking: Reported on 03/01/2024    [provider]  methocarbamol (ROBAXIN) 500 MG tablet Take 500 mg by mouth every 8 (eight) hours as needed for muscle spasms.     [provider]  Multiple Vitamins-Minerals (MULTIVITAMIN WITH MINERALS) tablet Take 1 tablet by mouth daily.    [provider]  oxyCODONE-acetaminophen (PERCOCET/ROXICET) 5-325 MG tablet Take 1 tablet by mouth 2 (two) times daily. 05/06/20   [provider]  sildenafil (REVATIO) 20 MG tablet Take 20 mg by mouth daily as needed (ED). 04/24/20   [provider]  traMADol (ULTRAM) 50 MG tablet Take 100 mg by mouth 3 (three) times daily. 04/12/20   [provider]  Past Surgical History Past Surgical History:  Procedure Laterality Date   ATRIAL FIBRILLATION ABLATION N/A 01/24/2019   Procedure: ATRIAL FIBRILLATION ABLATION;  Surgeon: Hillis Range, MD;  Location: [facility] INVASIVE CV LAB;  Service: Cardiovascular;  Laterality: N/A;   ATRIAL FIBRILLATION ABLATION N/A 04/14/2020   Procedure: ATRIAL FIBRILLATION ABLATION;  Surgeon: Hillis Range, MD;  Location: [facility] INVASIVE CV LAB;  Service: Cardiovascular;  Laterality: N/A;   CARDIOVERSION N/A 12/06/2018   Procedure: CARDIOVERSION;  Surgeon: Parke Poisson, MD;  Location: [facility] ENDOSCOPY;  Service: Cardiovascular;  Laterality: N/A;    CARDIOVERSION N/A 03/13/2019   Procedure: CARDIOVERSION;  Surgeon: Wendall Stade, MD;  Location: [facility] ENDOSCOPY;  Service: Cardiovascular;  Laterality: N/A;   CARDIOVERSION N/A 03/05/2020   Procedure: CARDIOVERSION;  Surgeon: Sande Rives, MD;  Location: [facility] ENDOSCOPY;  Service: Cardiovascular;  Laterality: N/A;   COLON SURGERY  12/07, 5/08, 10/08   COLONOSCOPY     COLOSTOMY  2007   COLOSTOMY REVERSAL     EYE SURGERY Bilateral    cataracts   INCISIONAL HERNIA REPAIR  2008   with mesh   KNEE ARTHROSCOPY Left 5/10   x2   LEFT HEART CATH AND CORONARY ANGIOGRAPHY N/A 10/01/2021   Procedure: LEFT HEART CATH AND CORONARY ANGIOGRAPHY;  Surgeon: Lyn Records, MD;  Location: [facility] INVASIVE CV LAB;  Service: Cardiovascular;  Laterality: N/A;   MULTIPLE TOOTH EXTRACTIONS     neck fusion  5/11   REPLANTATION THUMB Right    TONSILLECTOMY     TOTAL KNEE ARTHROPLASTY Left 08/23/2017   TOTAL KNEE ARTHROPLASTY Left 08/23/2017   Procedure: LEFT TOTAL KNEE ARTHROPLASTY;  Surgeon: Salvatore Marvel, MD;  Location: [facility] OR;  Service: Orthopedics;  Laterality: Left;   WISDOM TOOTH EXTRACTION     Family History Family History  Problem Relation Age of Onset   Hypertension Mother    CAD Father    Hypertension Father    Diabetes Father    Colon polyps Father    CAD Sister    Cancer Paternal Grandfather        brain and lung    Social History Social History   Tobacco Use   Smoking status: Former    Current packs/day: 0.00    Types: Cigarettes    Start date: 08/11/2010    Quit date: 08/11/2012    Years since quitting: 11.5   Smokeless tobacco: Never   Tobacco comments:    Former smoker 09/26/22  Vaping Use   Vaping status: Never Used  Substance Use Topics   Alcohol use: Yes    Alcohol/week: 4.0 standard drinks of alcohol    Types: 4 Cans of beer per week    Comment: 2 beers saturday and sunday 09/26/22   Drug use: No   Allergies Mushroom, Erythromycin, Hydrocodone-acetaminophen, Mobic  [meloxicam], Prednisone, Z-pak [azithromycin], Cefuroxime, Celecoxib, Duloxetine hcl, Lopressor [metoprolol tartrate], Metaxalone, Amoxicillin, Dilaudid [hydromorphone], and Penicillins  Review of Systems Review of Systems  Neurological:  Positive for dizziness and headaches.    Physical Exam Vital Signs  I have reviewed the triage vital signs There were no vitals taken for this visit.  Physical Exam  ED Results and Treatments Labs (all labs ordered are listed, but only abnormal results are displayed) Labs Reviewed  ETHANOL  PROTIME-INR  APTT  CBC  DIFFERENTIAL  COMPREHENSIVE METABOLIC PANEL  RAPID URINE DRUG SCREEN, HOSP PERFORMED  URINALYSIS, ROUTINE W REFLEX MICROSCOPIC  I-STAT CHEM 8, ED  Radiology No results found.  Pertinent labs & imaging results that were available during my care of the patient were reviewed by me and considered in my medical decision making (see MDM for details).  Medications Ordered in ED Medications  LORazepam (ATIVAN) injection 1 mg (has no administration in time range)                                                                                                                                     Procedures Procedures  (including critical care time)  Medical Decision Making / ED Course   This patient presents to the ED for concern of dizziness, headache, this involves an extensive number of treatment options, and is a complaint that carries with it a high risk of complications and morbidity.  The differential diagnosis includes CVA, hemorrhagic stroke, mass, Todd's paralysis, seizure, electrolyte abnormality, encephalopathy, complicated migraine  MDM: Patient seen in the emergency room and for evaluation of dizziness and vertigo.  Physical exam with some mild right beating nystagmus on left sided gaze but is otherwise  unremarkable.  No focal motor or sensory deficits.  As this is similar in presentation to his previous stroke, I immediately called the neurologist on-call Dr. Selina Cooley who is not recommending stroke alert at this time but instead emergent MRI.  Laboratory evaluation largely unremarkable.  MRI brain showing new areas of acute infarct in the right cerebellum slightly posterior and inferior to the previous region of infarct, MRI is slightly limited due to artifact and thus follow-up CT angio was performed that shows a occluded right vertebral artery with reconstitution.  Spoke with neurology team came to evaluate the patient at bedside.  Dr. Pearlean Brownie of the neurology service reviewed the MRIs personally and has lower suspicion for true acute infarct at this time.  Patient's symptoms appear to have completely resolved after Ativan administration for MRI.  Neurology recommending cardiology follow-up for watchman discussion and outpatient neurology follow-up.  Not recommending inpatient hospitalization at this time.  Thus, patient discharged with outpatient neurology and cardiology follow-up.   Additional history obtained: -Additional history obtained from wife -External records from outside source obtained and reviewed including: Chart review including previous notes, labs, imaging, consultation notes   Lab Tests: -I ordered, reviewed, and interpreted labs.   The pertinent results include:   Labs Reviewed  ETHANOL  PROTIME-INR  APTT  CBC  DIFFERENTIAL  COMPREHENSIVE METABOLIC PANEL  RAPID URINE DRUG SCREEN, HOSP PERFORMED  URINALYSIS, ROUTINE W REFLEX MICROSCOPIC  I-STAT CHEM 8, ED      EKG   EKG Interpretation Date/Time:  Friday March 08 2024 08:33:13 EDT Ventricular Rate:  59 PR Interval:  155 QRS Duration:  107 QT Interval:  443 QTC Calculation: 439 R Axis:   66  Text Interpretation: Sinus rhythm Atrial premature complexes Confirmed by Allysia Ingles ([id number]) on 03/08/2024 8:53:35 AM          Imaging  Studies ordered: I ordered imaging studies including MRI brain, CT angio I independently visualized and interpreted imaging. I agree with the radiologist interpretation   Medicines ordered and prescription drug management: Meds ordered this encounter  Medications   LORazepam (ATIVAN) injection 1 mg    -I have reviewed the patients home medicines and have made adjustments as needed  Critical interventions none  Consultations Obtained: I requested consultation with the neurology team on-call,  and discussed lab and imaging findings as well as pertinent plan - they recommend: Outpatient follow-up   Cardiac Monitoring: The patient was maintained on a cardiac monitor.  I personally viewed and interpreted the cardiac monitored which showed an underlying rhythm of: NSR  Social Determinants of Health:  Factors impacting patients care include: none   Reevaluation: After the interventions noted above, I reevaluated the patient and found that they have :improved  Co morbidities that complicate the patient evaluation  Past Medical History:  Diagnosis Date   Arthritis    Atrial flutter (HCC)    diagnosed 2005   Depression    Diverticular disease    GERD (gastroesophageal reflux disease)    Obesity    Paroxysmal atrial fibrillation (HCC)    Primary localized osteoarthritis of left knee       Dispostion: I considered admission for this patient, but after neurology evaluation, patient does not meet inpatient criteria for admission and will be discharged with outpatient follow-up     Final Clinical Impression(s) / ED Diagnoses Final diagnoses:  None     @PCDICTATION @    Glendora Score, MD 03/08/24 [phone]

## 2024-03-08 NOTE — ED Notes (Signed)
 Patient transported to CT

## 2024-03-08 NOTE — Telephone Encounter (Signed)
 Patient Product/process development scientist completed.    The patient is insured through [facility]. Patient has [employer], may use a copay card, and/or apply for patient assistance if available.    Ran test claim for dabigatran (Pradaxa) 150 mg and the current 30 day co-pay is $40.00.   This test claim was processed through [facility]- copay amounts may vary at other pharmacies due to pharmacy/plan contracts, or as the patient moves through the different stages of their insurance plan.     Roland Earl, CPHT Pharmacy Technician III Certified Patient Advocate [facility] Patient Advocate Team Direct Number: [phone]  Fax: [phone]

## 2024-03-08 NOTE — Consult Note (Signed)
 NEUROLOGY CONSULT NOTE   Date of service: March 08, 2024 Patient Name: [name] MRN:  [id number] DOB:  [birth year] Chief Complaint: "headache and dizziness" Requesting Provider: Glendora Score, MD  History of Present Illness  David SWEETSER is a 63 y.o. male with hx of R cerebellar stroke (discharged from [facility]  3/8), Aflutter/Afib (just started on eliquis), Depression, Arthritis, GERD who presented to ED this AM due to acute onset of headache, dizziness, dry heaves and balance issues. Confirmed he took his Eliquis at 0500 today, after being discharged on it 3/8. Neurology consulted stroke-like symptoms.  MRI Brain report states small new acute infarcts in R cerebellum. However, on stroke team review, mostly evolution of the recent stroke, do not see definite clearly new strokes. On neurology exam, no confusion, no focal dysarthria or aphasia, no focal weakness, no sensory deficit. Denied headache, SOB, N/V.   LKW: 0330 Modified rankin score: 0-Completely asymptomatic and back to baseline post- stroke IV Thrombolysis: No on Eliquis EVT: No, no LVO  NIHSS components Score: Comment  1a Level of Conscious 0[]  1[]  2[]  3[]      1b LOC Questions 0[]  1[]  2[]       1c LOC Commands 0[]  1[]  2[]       2 Best Gaze 0[]  1[]  2[]       3 Visual 0[]  1[]  2[]  3[]      4 Facial Palsy 0[]  1[]  2[]  3[]      5a Motor Arm - left 0[]  1[]  2[]  3[]  4[]  UN[]    5b Motor Arm - Right 0[]  1[]  2[]  3[]  4[]  UN[]    6a Motor Leg - Left 0[]  1[]  2[]  3[]  4[]  UN[]    6b Motor Leg - Right 0[]  1[]  2[]  3[]  4[]  UN[]    7 Limb Ataxia 0[]  1[]  2[]  3[]  UN[]     8 Sensory 0[]  1[]  2[]  UN[]      9 Best Language 0[]  1[]  2[]  3[]      10 Dysarthria 0[]  1[]  2[]  UN[]      11 Extinct. and Inattention 0[]  1[]  2[]       TOTAL:    0      ROS  Comprehensive ROS performed and pertinent positives documented in HPI   Past History   Past Medical History:  Diagnosis Date   Arthritis    Atrial flutter (HCC)    diagnosed 2005   Depression     Diverticular disease    GERD (gastroesophageal reflux disease)    Obesity    Paroxysmal atrial fibrillation (HCC)    Primary localized osteoarthritis of left knee     Past Surgical History:  Procedure Laterality Date   ATRIAL FIBRILLATION ABLATION N/A 01/24/2019   Procedure: ATRIAL FIBRILLATION ABLATION;  Surgeon: Hillis Range, MD;  Location: [facility] INVASIVE CV LAB;  Service: Cardiovascular;  Laterality: N/A;   ATRIAL FIBRILLATION ABLATION N/A 04/14/2020   Procedure: ATRIAL FIBRILLATION ABLATION;  Surgeon: Hillis Range, MD;  Location: [facility] INVASIVE CV LAB;  Service: Cardiovascular;  Laterality: N/A;   CARDIOVERSION N/A 12/06/2018   Procedure: CARDIOVERSION;  Surgeon: Parke Poisson, MD;  Location: [facility] ENDOSCOPY;  Service: Cardiovascular;  Laterality: N/A;   CARDIOVERSION N/A 03/13/2019   Procedure: CARDIOVERSION;  Surgeon: Wendall Stade, MD;  Location: [facility] ENDOSCOPY;  Service: Cardiovascular;  Laterality: N/A;   CARDIOVERSION N/A 03/05/2020   Procedure: CARDIOVERSION;  Surgeon: Sande Rives, MD;  Location: [facility] ENDOSCOPY;  Service: Cardiovascular;  Laterality: N/A;   COLON SURGERY  12/07, 5/08, 10/08   COLONOSCOPY     COLOSTOMY  2007  COLOSTOMY REVERSAL     EYE SURGERY Bilateral    cataracts   INCISIONAL HERNIA REPAIR  2008   with mesh   KNEE ARTHROSCOPY Left 5/10   x2   LEFT HEART CATH AND CORONARY ANGIOGRAPHY N/A 10/01/2021   Procedure: LEFT HEART CATH AND CORONARY ANGIOGRAPHY;  Surgeon: Lyn Records, MD;  Location: [facility] INVASIVE CV LAB;  Service: Cardiovascular;  Laterality: N/A;   MULTIPLE TOOTH EXTRACTIONS     neck fusion  5/11   REPLANTATION THUMB Right    TONSILLECTOMY     TOTAL KNEE ARTHROPLASTY Left 08/23/2017   TOTAL KNEE ARTHROPLASTY Left 08/23/2017   Procedure: LEFT TOTAL KNEE ARTHROPLASTY;  Surgeon: Salvatore Marvel, MD;  Location: [facility] OR;  Service: Orthopedics;  Laterality: Left;   WISDOM TOOTH EXTRACTION      Family History: Family History  Problem Relation  Age of Onset   Hypertension Mother    CAD Father    Hypertension Father    Diabetes Father    Colon polyps Father    CAD Sister    Cancer Paternal Grandfather        brain and lung    Social History  reports that he quit smoking about 11 years ago. His smoking use included cigarettes. He started smoking about 13 years ago. He has never used smokeless tobacco. He reports current alcohol use of about 4.0 standard drinks of alcohol per week. He reports that he does not use drugs.  Allergies  Allergen Reactions   Mushroom Swelling and Other (See Comments)    Causes tongue swelling that cuts of airway   Erythromycin Other (See Comments)    Abdominal cramps    Hydrocodone-Acetaminophen Other (See Comments)    Makes him feel "high"   Mobic [Meloxicam] Other (See Comments)    Lower back and kidney pain    Prednisone Hives, Anxiety and Other (See Comments)    Makes him anxious   Z-Pak [Azithromycin] Nausea Only and Other (See Comments)    Severe stomach cramps    Cefuroxime     worsening back pain?   Celecoxib     myalgias   Duloxetine Hcl     spacy   Lopressor [Metoprolol Tartrate] Swelling   Metaxalone     Unknown   Amoxicillin Palpitations   Dilaudid [Hydromorphone] Palpitations    Pt stated he is not allergic to this medication 03/25   Penicillins Rash and Other (See Comments)    Has patient had a PCN reaction causing immediate rash, facial/tongue/throat swelling, SOB or lightheadedness with hypotension: No Has patient had a PCN reaction causing severe rash involving mucus membranes or skin necrosis: No Has patient had a PCN reaction that required hospitalization: No Has patient had a PCN reaction occurring within the last 10 years: No If all of the above answers are "NO", then may proceed with Cephalosporin use.      Medications  No current facility-administered medications for this encounter.  Current Outpatient Medications:    acetaminophen (TYLENOL) 650 MG CR  tablet, Take 1,300 mg by mouth 2 (two) times daily., Disp: , Rfl:    apixaban (ELIQUIS) 5 MG TABS tablet, Take 1 tablet (5 mg total) by mouth 2 (two) times daily., Disp: 60 tablet, Rfl: 1   atenolol (TENORMIN) 50 MG tablet, Take 1 tablet (50 mg total) by mouth 2 (two) times daily., Disp: 90 tablet, Rfl: 3   diclofenac (VOLTAREN) 50 MG EC tablet, Take 50 mg by mouth 2 (two) times daily., Disp: ,  Rfl:    Docusate Sodium (COLACE PO), Take 1 tablet by mouth daily. (Patient not taking: Reported on 03/01/2024), Disp: , Rfl:    methocarbamol (ROBAXIN) 500 MG tablet, Take 500 mg by mouth every 8 (eight) hours as needed for muscle spasms. , Disp: , Rfl:    Multiple Vitamins-Minerals (MULTIVITAMIN WITH MINERALS) tablet, Take 1 tablet by mouth daily., Disp: , Rfl:    oxyCODONE-acetaminophen (PERCOCET/ROXICET) 5-325 MG tablet, Take 1 tablet by mouth 2 (two) times daily., Disp: , Rfl:    sildenafil (REVATIO) 20 MG tablet, Take 20 mg by mouth daily as needed (ED)., Disp: , Rfl:    traMADol (ULTRAM) 50 MG tablet, Take 100 mg by mouth 3 (three) times daily., Disp: , Rfl:   Vitals   Vitals:   2024-04-05 1035 2024/04/05 1045 2024/04/05 1100 05-Apr-2024 1130  BP: 134/70 (!) 140/75 119/69 (!) 145/82  Pulse: (!) 109 98 (!) 35 (!) 40  Resp:   20 (!) 23  Temp:      TempSrc:      SpO2: 97% 100% 99% 98%    There is no height or weight on file to calculate BMI.  Physical Exam   Constitutional: Appears well-developed and well-nourished.  Psych: Affect appropriate to situation.  Eyes: No scleral injection.  HENT: No OP obstruction. Head: Normocephalic.  Cardiovascular: Normal rate and regular rhythm.  Respiratory: Effort normal, non-labored breathing.  GI: Soft.  No distension. There is no tenderness.  Skin: WDI.   Neurologic Examination   Neuro: Mental Status: Patient is awake, alert, oriented to person, place, month, year, and situation. Patient is able to give a clear and coherent history. No signs of aphasia  or neglect. Cranial Nerves: II: Visual Fields are full. Pupils are equal, round, and reactive to light.   III,IV, VI: EOMI without ptosis or diploplia.  V: Facial sensation is symmetric to temperature VII: Facial movement is symmetric.  VIII: hearing is intact to voice X: Uvula elevates symmetrically XI: Shoulder shrug is symmetric. XII: tongue is midline without atrophy or fasciculations.  Motor: Tone is normal. Bulk is normal. 5/5 strength was present in all four extremities.  Sensory: Sensation is symmetric to light touch and temperature in the arms and legs. Cerebellar: FNF and HKS are intact bilaterally   Labs/Imaging/Neurodiagnostic studies   CBC:  Recent Labs  Lab 2024-04-05 0833 04/05/2024 0844  WBC 8.7  --   NEUTROABS 6.6  --   HGB 14.5 14.3  HCT 43.7 42.0  MCV 94.4  --   PLT 204  --    Basic Metabolic Panel:  Lab Results  Component Value Date   NA 139 04-05-24   K 4.3 2024/04/05   CO2 21 (L) 05-Apr-2024   GLUCOSE 124 (H) April 05, 2024   BUN 20 2024-04-05   CREATININE 0.90 Apr 05, 2024   CALCIUM 9.1 04/05/24   GFRNONAA >60 Apr 05, 2024   GFRAA 96 04/01/2020   Lipid Panel:  Lab Results  Component Value Date   LDLCALC 67 03/02/2024   HgbA1c:  Lab Results  Component Value Date   HGBA1C 5.4 03/01/2024   Urine Drug Screen:     Component Value Date/Time   LABOPIA NONE DETECTED Apr 05, 2024 1036   COCAINSCRNUR NONE DETECTED Apr 05, 2024 1036   LABBENZ NONE DETECTED 04-05-2024 1036   AMPHETMU NONE DETECTED 2024-04-05 1036   THCU NONE DETECTED 04-05-24 1036   LABBARB NONE DETECTED Apr 05, 2024 1036    Alcohol Level     Component Value Date/Time   ETH <10 2024-04-05 1610  INR  Lab Results  Component Value Date   INR 1.1 03/08/2024   APTT  Lab Results  Component Value Date   APTT 34 03/08/2024    CT angio Head and Neck with contrast(Personally reviewed): Redemonstrated chronic occlusion of the right vertebral artery at the vessel origin.  Reconstitution of the vessel along the distal V2/V3 segments similar to prior.  Irregularity and mild narrowing of the distal V4 segment of the right vertebral artery is slightly less pronounced compared to prior likely related to differences in contrast timing.  MR Angio head without contrast and Carotid Duplex BL(Personally reviewed): MRA slightly limited due to artifact. Within these limitations there is no large vessel occlusion noted. The SCA is and PICAs are not well visualized. Consider CTA for further evaluation.  MRI Brain(Personally reviewed): New areas of acute infarct in the right cerebellum slightly posterior and inferior to the previous region of infarct.   Evolving subacute infarct in the right SCA territory. Resolution of previously noted petechial hemorrhage.   ASSESSMENT   [name] is a 63 y.o. male with hx of R cerebellar stroke (discharged from [facility]  3/8), Aflutter/Afib (just started on eliquis) who presented to ED this AM due to acute onset of headache, dizziness, dry heaves and balance issues. Confirmed he took his Eliquis at 0500 today, after being discharged on it 3/8.  MRI Brain report states small new acute infarcts in R cerebellum. However, on stroke team review, mostly evolution of the recent stroke, do not see definite clearly new strokes. On neurology exam, no confusion, no focal dysarthria or aphasia, no focal weakness, no sensory deficit.   Stroke work-up was just completed less than a week ago. LDL and A1c within goal. ECHO showed EF of 60-65% with severely dilated left atria. He was started on Eliquis. Referred to Afib Clinic and Neurology outpatient. Discharged with no PT/OT follow-up needs, with NIH of 0.    RECOMMENDATIONS   - Continue Eliquis BD - Follow-ups have been made with cardiology, Afib Clinic, PCP and Neurology. Discus possibility of need for Watchman device with Cardiologist (discussed with patient)  Ok for discharge from  neurological standpoint, once cleared by PT.  ______________________________________________________________________    Pt seen by Neuro NP/APP and later by MD. Note/plan to be edited by MD as needed.    Lynnae January, DNP, AGACNP-BC [facility] Please use AMION for contact information & EPIC for messaging.   Attending Neurohospitalist Addendum Patient seen and examined with APP/Resident. Agree with the history and physical as documented above. Agree with the plan as documented, which I helped formulate. I have edited the note above to reflect my full findings and recommendations. I have independently reviewed the chart, obtained history, review of systems and examined the patient.I have personally reviewed pertinent head/neck/spine imaging (CT/MRI). Please feel free to call with any questions.  -- Bing Neighbors, MD [facility] [phone]  If 7pm- 7am, please page neurology on call as listed in AMION.

## 2024-03-08 NOTE — ED Notes (Signed)
 Patient verbalizes understanding of discharge instructions. Opportunity for questioning and answers were provided. Pt discharged from ED.

## 2024-03-08 NOTE — ED Triage Notes (Signed)
 Pt BIB [facility] from home d/t stroke-like s/s that she noticed at 0430 this morning. Pt states LKN 0330 when he walked his dog. EMS reports pt woke up at 0430 with a global HA, dry heaves, feeling dizzy & holding onto walls to walk. Wife said his speech didn't sound normal, pt endorses his mouth was dry but his speech sounds normal to him. EMS reports A/Ox4, VSS & his did take his Eliquis at 0500 this morning. Pt was here as Code Stroke on 03/01/24 last week & was given TNK with some residual Rt hand deficit at baseline upon arrival today.

## 2024-03-08 NOTE — ED Notes (Signed)
 Transported to MRI

## 2024-03-08 NOTE — ED Notes (Signed)
 Neurology at bedside.

## 2024-03-13 ENCOUNTER — Telehealth: Payer: Self-pay

## 2024-03-13 ENCOUNTER — Ambulatory Visit (HOSPITAL_COMMUNITY)
Admission: RE | Admit: 2024-03-13 | Discharge: 2024-03-13 | Disposition: A | Discharge status: Home / Self Care | Source: Ambulatory Visit | Attending: Physician Assistant | Admitting: Physician Assistant

## 2024-03-13 ENCOUNTER — Encounter (HOSPITAL_COMMUNITY): Payer: Self-pay | Admitting: Physician Assistant

## 2024-03-13 VITALS — BP 128/88 | HR 72 | Ht 69.0 in | Wt 232.6 lb

## 2024-03-13 DIAGNOSIS — Z7901 Long term (current) use of anticoagulants: Secondary | ICD-10-CM | POA: Insufficient documentation

## 2024-03-13 DIAGNOSIS — I4811 Longstanding persistent atrial fibrillation: Secondary | ICD-10-CM | POA: Insufficient documentation

## 2024-03-13 DIAGNOSIS — D6869 Other thrombophilia: Secondary | ICD-10-CM | POA: Diagnosis not present

## 2024-03-13 DIAGNOSIS — I10 Essential (primary) hypertension: Secondary | ICD-10-CM | POA: Insufficient documentation

## 2024-03-13 DIAGNOSIS — I2510 Atherosclerotic heart disease of native coronary artery without angina pectoris: Secondary | ICD-10-CM | POA: Diagnosis not present

## 2024-03-13 DIAGNOSIS — Z79899 Other long term (current) drug therapy: Secondary | ICD-10-CM | POA: Insufficient documentation

## 2024-03-13 DIAGNOSIS — I484 Atypical atrial flutter: Secondary | ICD-10-CM | POA: Diagnosis not present

## 2024-03-13 MED ORDER — APIXABAN 5 MG PO TABS: 5.0000 mg | ORAL_TABLET | Freq: Two times a day (BID) | ORAL | 3 refills | Status: DC

## 2024-03-13 NOTE — Progress Notes (Signed)
 Primary Care Physician: [name], MD Referring Physician: Dr. Vanessa Barbara is a 63 y.o. male with a h/o paroxysmal afib and flutter, s/p ablation x 2, last one in April 2021, on amiodarone , that is in the afib clinic, for f/u. He is in an atypical atrial flutter today. He does not feel any different in rhythm than out of rhythm.  He shows me his Lourena Simmonds which shows intermittent SR. He continues on eliquis for a CHA2DS2VASc score of 1. His labs TSH, cmet were normal checked last in June by PCP, reviewed on pt's phone.   F/u in afib clinic, 06/01/21, he appears to be in persistent  atrial flutter now. He has stopped amiodarone under the recent guidance of Dr. [name], with Lourena Simmonds and Zio patch showing persistent atrial  flutter and he has  decided to live in atrial flutter. He has noted some increase in BP and HR over the last week but this coincides with a cortisone shot for his chronic back pain 10 days ago. He is on max dose of Cardizem but is an intermittent acting  tablet and probably wears off after 8 hours use. We discussed changing over to long acting Cardizem to see if improvement and also how cortisone  can have short term consequences to HR and BP. He will need additional cortisone over time as surgery is not an option with his back issues.   Follow up in the AF clinic 09/26/22. Patient is s/p convergent with Dr Herbert Deaner at [facility] 11/2021. He reports that he has done very well since that time. His last ILR report showed 1.1% afib burden. Had back surgery and never resumed Eliquis.   Follow up in the AF clinic 01/24/24. Patient returns for follow up for atrial fibrillation. Patient reports that he has rare palpitations lasting only 1-2 seconds. He is in SR today. He is no longer followed at [facility].   Follow up 03/13/24. Patient returns for follow up for atrial fibrillation. He presented to the ED 03/01/24 with acute onset dizziness, headache, and right arm dysesthesia. Code stroke activated,  found to have a right cerebellar infarct, s/p TNK. His Eliquis was resumed. He is in SR today. No bleeding issues on anticoagulation.   Today, he denies symptoms of palpitations, chest pain, shortness of breath, orthopnea, PND, lower extremity edema, dizziness, presyncope, syncope, snoring, daytime somnolence, bleeding. The patient is tolerating medications without difficulties and is otherwise without complaint today.    Past Medical History:  Diagnosis Date   Arthritis    Atrial flutter (HCC)    diagnosed 2005   Depression    Diverticular disease    GERD (gastroesophageal reflux disease)    Obesity    Paroxysmal atrial fibrillation (HCC)    Primary localized osteoarthritis of left knee     Current Outpatient Medications  Medication Sig Dispense Refill   acetaminophen (TYLENOL) 650 MG CR tablet Take 1,300 mg by mouth 2 (two) times daily.     apixaban (ELIQUIS) 5 MG TABS tablet Take 1 tablet (5 mg total) by mouth 2 (two) times daily. 60 tablet 1   atenolol (TENORMIN) 50 MG tablet Take 1 tablet (50 mg total) by mouth 2 (two) times daily. 90 tablet 3   benzonatate (TESSALON) 200 MG capsule Take 200 mg by mouth 3 (three) times daily as needed.     diclofenac (VOLTAREN) 50 MG EC tablet Take 50 mg by mouth 2 (two) times daily.     Docusate Sodium (  COLACE PO) Take 1 tablet by mouth daily.     doxycycline (VIBRA-TABS) 100 MG tablet Take 100 mg by mouth 2 (two) times daily.     methocarbamol (ROBAXIN) 500 MG tablet Take 500 mg by mouth every 8 (eight) hours as needed for muscle spasms.      Multiple Vitamins-Minerals (MULTIVITAMIN WITH MINERALS) tablet Take 1 tablet by mouth daily.     oxyCODONE-acetaminophen (PERCOCET/ROXICET) 5-325 MG tablet Take 1 tablet by mouth 2 (two) times daily.     sildenafil (REVATIO) 20 MG tablet Take 20 mg by mouth daily as needed (ED).     traMADol (ULTRAM) 50 MG tablet Take 100 mg by mouth 3 (three) times daily.     No current facility-administered medications  for this encounter.    ROS- All systems are reviewed and negative except as per the HPI above  Physical Exam: Vitals:   03/13/24 0828  BP: 128/88  Pulse: 72  Weight: 105.5 kg  Height: 5\' 9"  (1.753 m)    Wt Readings from Last 3 Encounters:  03/13/24 105.5 kg  03/01/24 111 kg  01/24/24 109.2 kg    GEN: Well nourished, well developed in no acute distress CARDIAC: Regular rate and rhythm with occasional ectopy, no murmurs, rubs, gallops RESPIRATORY:  Clear to auscultation without rales, wheezing or rhonchi  ABDOMEN: Soft, non-tender, non-distended EXTREMITIES:  No edema; No deformity    EKG today demonstrates SR, PACs Vent. rate 72 BPM PR interval 156 ms QRS duration 88 ms QT/QTcB 394/431 ms   CHA2DS2-VASc Score = 4  The patient's score is based upon: CHF History: 0 HTN History: 1 Diabetes History: 0 Stroke History: 2 Vascular Disease History: 1 Age Score: 0 Gender Score: 0       ASSESSMENT AND PLAN: Longstanding Persistent Atrial Fibrillation/atrial flutter The patient's CHA2DS2-VASc score is 4, indicating a 4.8% annual risk of stroke.   S/p afib ablation 12/2018 and 03/2020 S/p convergent 11/2021 with Dr Herbert Deaner Patient in SR today. He has an ILR in place but has declined further monitoring.  Continue atenolol 50 mg BID Continue Eliquis 5 mg BID  Secondary Hypercoagulable State (ICD10:  D68.69) The patient is at significant risk for stroke/thromboembolism based upon his CHA2DS2-VASc Score of 4.  Continue Apixaban (Eliquis). Long term, patient is interested in consultation for Watchman device, will refer. Brochure given.   HTN Stable on current regimen  CAD CAC score 1083 Non-obstructive on [facility] 09/2021 No anginal symptoms Will refer to the [facility] Prevention [facility].    Follow up with EP to establish care and discuss Watchman.    [name] PA-C Afib Clinic [facility] [address] [place], [place] [address] [phone]

## 2024-03-13 NOTE — Telephone Encounter (Signed)
Per [name], called to arrange [facility] consult.  Left message to call back.

## 2024-03-13 NOTE — Patient Instructions (Addendum)
 Referral placed for watchman consult

## 2024-03-13 NOTE — Telephone Encounter (Signed)
 Scheduled the patient for Watchman consult with Dr. Michele Rockers 03/15/2024. He was grateful for call and agreed with plan.

## 2024-03-14 ENCOUNTER — Other Ambulatory Visit: Payer: Self-pay

## 2024-03-14 ENCOUNTER — Telehealth: Payer: Self-pay

## 2024-03-14 ENCOUNTER — Ambulatory Visit: Attending: Family Medicine | Admitting: Occupational Therapy

## 2024-03-14 DIAGNOSIS — R278 Other lack of coordination: Secondary | ICD-10-CM | POA: Diagnosis present

## 2024-03-14 DIAGNOSIS — I69351 Hemiplegia and hemiparesis following cerebral infarction affecting right dominant side: Secondary | ICD-10-CM | POA: Diagnosis present

## 2024-03-14 DIAGNOSIS — R270 Ataxia, unspecified: Secondary | ICD-10-CM | POA: Diagnosis present

## 2024-03-14 DIAGNOSIS — M6281 Muscle weakness (generalized): Secondary | ICD-10-CM | POA: Diagnosis present

## 2024-03-14 NOTE — Therapy (Signed)
 OUTPATIENT OCCUPATIONAL THERAPY NEURO EVALUATION  Patient Name: [name] MRN: [id number] DOB:1961-11-09, 63 y.o., male 28 Date: 03/14/2024  PCP: [name], MD REFERRING PROVIDER: [name], MD  END OF SESSION:  OT End of Session - 03/14/24 0857     Visit Number 1    Number of Visits 7    Date for OT Re-Evaluation 04/12/24    Authorization Type [employer] [id number]    OT Start Time 0845    OT Stop Time 0930    OT Time Calculation (min) 45 min    Activity Tolerance Patient tolerated treatment well    Behavior During Therapy WFL for tasks assessed/performed             Past Medical History:  Diagnosis Date   Arthritis    Atrial flutter (HCC)    diagnosed 2005   Depression    Diverticular disease    GERD (gastroesophageal reflux disease)    Obesity    Paroxysmal atrial fibrillation (HCC)    Primary localized osteoarthritis of left knee    Past Surgical History:  Procedure Laterality Date   ATRIAL FIBRILLATION ABLATION N/A 01/24/2019   Procedure: ATRIAL FIBRILLATION ABLATION;  Surgeon: Hillis Range, MD;  Location: [facility];  Service: Cardiovascular;  Laterality: N/A;   ATRIAL FIBRILLATION ABLATION N/A 04/14/2020   Procedure: ATRIAL FIBRILLATION ABLATION;  Surgeon: Hillis Range, MD;  Location: [facility];  Service: Cardiovascular;  Laterality: N/A;   CARDIOVERSION N/A 12/06/2018   Procedure: CARDIOVERSION;  Surgeon: Parke Poisson, MD;  Location: [facility] ENDOSCOPY;  Service: Cardiovascular;  Laterality: N/A;   CARDIOVERSION N/A 03/13/2019   Procedure: CARDIOVERSION;  Surgeon: Wendall Stade, MD;  Location: [facility] ENDOSCOPY;  Service: Cardiovascular;  Laterality: N/A;   CARDIOVERSION N/A 03/05/2020   Procedure: CARDIOVERSION;  Surgeon: Sande Rives, MD;  Location: [facility] ENDOSCOPY;  Service: Cardiovascular;  Laterality: N/A;   COLON SURGERY  12/07, 5/08, 10/08   COLONOSCOPY     COLOSTOMY  2007   COLOSTOMY REVERSAL     EYE SURGERY Bilateral     cataracts   INCISIONAL HERNIA REPAIR  2008   with mesh   KNEE ARTHROSCOPY Left 5/10   x2   LEFT HEART CATH AND CORONARY ANGIOGRAPHY N/A 10/01/2021   Procedure: LEFT HEART CATH AND CORONARY ANGIOGRAPHY;  Surgeon: Lyn Records, MD;  Location: [facility];  Service: Cardiovascular;  Laterality: N/A;   MULTIPLE TOOTH EXTRACTIONS     neck fusion  5/11   REPLANTATION THUMB Right    TONSILLECTOMY     TOTAL KNEE ARTHROPLASTY Left 08/23/2017   TOTAL KNEE ARTHROPLASTY Left 08/23/2017   Procedure: LEFT TOTAL KNEE ARTHROPLASTY;  Surgeon: Salvatore Marvel, MD;  Location: [facility] OR;  Service: Orthopedics;  Laterality: Left;   WISDOM TOOTH EXTRACTION     Patient Active Problem List   Diagnosis Date Noted   Stroke (cerebrum) (HCC) 03/01/2024   Hypercoagulable state due to longstanding persistent atrial fibrillation (HCC) 01/24/2024   Symptoms of upper respiratory infection (URI) 02/13/2022   Essential hypertension 02/13/2022   Chest pain of uncertain etiology    CAD in native artery    DOE (dyspnea on exertion)    Atypical atrial flutter (HCC) 05/12/2020   Atrial fibrillation with RVR (HCC) 12/05/2018   Atrial fibrillation (HCC) 08/08/2017   Diverticular disease    Primary localized osteoarthritis of left knee    Spinal stenosis 11/19/2014   Diarrhea 07/26/2012    ONSET DATE: 03/01/24  REFERRING  DIAG: Z34.73 (ICD-10-CM) - Personal history of transient ischemic attack (TIA), and cerebral infarction without residual deficits  THERAPY DIAG:  Hemiplegia and hemiparesis following cerebral infarction affecting right dominant side (HCC)  Muscle weakness (generalized)  Other lack of coordination  Ataxia  Rationale for Evaluation and Treatment: Rehabilitation  SUBJECTIVE:   SUBJECTIVE STATEMENT: Pt reports R hand and arm difficulty with brushing teeth, handwriting - it wouldn't do what I was telling it to, and coordination to shake a seasoning onto food.  Hand feels "clumsy and tight"  and feels that he must visually attend to RUE to have it do tasks.  Pt accompanied by: self  PERTINENT HISTORY: 63 yo male presented to [facility] with dizziness and R sided headache on 03/01/24. MRI showing: Acute Right SCA cerebellar infarct with petechial hemorrhage no significant mass effect, TNK administered on 3/7. Pt returned to ED on 3/14 with similar symptoms as above.  MRI Brain report states small new acute infarcts in R cerebellum. However, on stroke team review, mostly evolution of the recent stroke, do not see definite clearly new strokes. Per PCP note, pt still finds some mild difficulty with mobility of right arm, but no weakness on examination today. It is felt that stroke was triggered by recurrent A-fib. Pt now back on Eliquis.  PMH: hx of R cerebellar stroke (discharged from [facility]  3/8), Aflutter/Afib (just started on eliquis), Depression, Arthritis, GERD, HTN, and chronic pain  PRECAUTIONS: None  WEIGHT BEARING RESTRICTIONS: No  PAIN:  Are you having pain? Yes: NPRS scale: 5/10 Pain location: back Pain description: chronic pain due to multiple spinal cord/back surgeries and spinal stenosis Aggravating factors: crawling and moving heavy objects - work related tasks Relieving factors: reports mornings are his best time in regards to pain  FALLS: Has patient fallen in last 6 months? 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  HAND DOMINANCE: Right  ADLs: Overall ADLs: Independent with bathing and dressing tasks Transfers/ambulation related to ADLs: Mod I - Independent with no AD Eating: "hesitancy" and decreased control with scooping foods Grooming: reports brushing teeth is "different" Equipment: Shower seat without back and Grab bars  IADLs: Meal Prep: "hesitancy" and decreased motor control when picking up tongs and flipping food, shaking on seasoning, asking for assistance with cutting cantaloupe  Community mobility: driving Medication management: Mod I Handwriting:  "I wrist real small now".  Pt reports handwriting 2/10 on legibility compared to prior to stroke.  PPT #1: (whales live in a blue ocean): 16.19 sec  MOBILITY STATUS: Independent  POSTURE COMMENTS:  No Significant postural limitations  ACTIVITY TOLERANCE: Activity tolerance: WFL for tasks assessed on eval  FUNCTIONAL OUTCOME MEASURES: PSFS 3.7   UPPER EXTREMITY ROM:    Active ROM Right eval Left eval  Shoulder flexion WFL - mild decreased motor control with shoulder movements WFL  Shoulder abduction [facility]  Shoulder adduction  Shoulder extension    Shoulder internal rotation [facility]  Shoulder external rotation [facility] WFL  Elbow flexion [facility] WFL  Elbow extension [facility] WFL  Wrist flexion    Wrist extension    Wrist ulnar deviation    Wrist radial deviation    Wrist pronation    Wrist supination    (Blank rows = not tested)  UPPER EXTREMITY MMT:     MMT Right eval Left eval  Shoulder flexion 4- 5  Shoulder abduction    Shoulder adduction    Shoulder extension    Shoulder internal rotation    Shoulder external rotation    Middle trapezius    Lower trapezius    Elbow flexion 4+ 5  Elbow extension 4+ 5  Wrist flexion    Wrist extension    Wrist ulnar deviation    Wrist radial deviation    Wrist pronation    Wrist supination    (Blank rows = not tested)  HAND  FUNCTION: Grip strength: Right: 87 lbs; Left: 92 lbs, Lateral pinch: Right: 27 lbs, Left: 23 lbs, and 3 point pinch: Right: 20 lbs, Left: 20 lbs  COORDINATION: Finger Nose Finger test: Mild dysmetria on R and slower compared to L 9 Hole Peg test: Right: 37.13 sec; Left: 29.28 sec Box and Blocks:  Right 47 blocks, Left 57 blocks  SENSATION: WFL  COGNITION: Overall cognitive status: Within functional limits for tasks assessed  VISION: Subjective report: "blurriness" reports wearing reading glasses more than before; has scar on L eye due to a stick going through his eye Baseline vision: Wears glasses for reading only and h/o cataract removal Visual history: cataracts removal  VISION ASSESSMENT: Not tested  OBSERVATIONS: Impaired motor control of RUE noted with shoulder ROM, most specifically with shoulder flexion.  Pt demonstrating "clumsy" movements with coordination assessment and dysmetria with finger to nose assessment on R.                                                                                                                           TREATMENT DATE:  03/14/24  Educated on [facility] tasks with picking up variety of small items, stacking/unstacking coins, and placing items into container with progressively smaller target to address motor control.  Provided with handout - see pt instructions.    PATIENT EDUCATION: Education details: Educated on role and purpose of OT as well as potential interventions and goals for therapy based on initial evaluation findings. Person educated: Patient Education method: Explanation and Handouts Education comprehension: verbalized understanding and needs further education  HOME EXERCISE PROGRAM: 03/14/24 - coordination HEP (see pt instructions)   GOALS: Goals reviewed with patient? Yes   LONG TERM GOALS: Target date: 04/12/24  Pt will be independent with coordination and strengthening HEP with use of handouts. Baseline: new to OP OT Goal  status: INITIAL  2.  Pt will demonstrated improvement in speed and legibility with handwriting as evidenced by improvement of legibility to 6/10  and writing simple sentence in <12 seconds. Baseline: PPT #1: 16.19 sec and pt reporting legibility 2/10 Goal status: INITIAL  3.  Pt will demonstrate improved motor control of RUE to engage in toothbrushing and shaking seasoning with cooking as evidenced by improved score on Box and Blocks assessment by 6 blocks with RUE. Baseline: R: 47 and L: 57 blocks Goal status: INITIAL  4.  Pt will demonstrate improved FMC as needed for ADLs and IADLs as evidenced by improved score on 9 hole peg test by 5 seconds with RUE. Baseline: Right: 37.13 sec; Left: 29.28 sec Goal status: INITIAL  5.  Patient will report at least two-point increase in average PSFS score or at least three-point increase in a single activity score indicating functionally significant improvement given minimum detectable change. Baseline: 3.7 score Goal status: INITIAL  ASSESSMENT:  CLINICAL IMPRESSION: Patient is a 63 y.o. male who was seen today for occupational therapy evaluation for RUE weakness s/p CVA. 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 Pt will benefit from skilled occupational therapy services to address strength and coordination, ROM, GM/FM control, safety awareness, introduction of compensatory strategies/AE prn, visual-perception, and implementation of an HEP to improve participation and safety during ADLs and IADLs.    PERFORMANCE DEFICITS: in functional skills including ADLs, IADLs, coordination, proprioception, ROM, strength, pain, Fine motor control, Gross motor control, body mechanics, decreased knowledge of precautions, decreased knowledge of use of DME, vision, and UE functional use and psychosocial skills including environmental adaptation and routines  and behaviors.   IMPAIRMENTS: are limiting patient from ADLs, IADLs, work, and leisure.   CO-MORBIDITIES: may have co-morbidities  that affects occupational performance. Patient will benefit from skilled OT to address above impairments and improve overall function.  MODIFICATION OR ASSISTANCE TO COMPLETE EVALUATION: No modification of tasks or assist necessary to complete an evaluation.  OT OCCUPATIONAL PROFILE AND HISTORY: Detailed assessment: Review of records and additional review of physical, cognitive, psychosocial history related to current functional performance.  CLINICAL DECISION MAKING: Moderate - several treatment options, min-mod task modification necessary  REHAB POTENTIAL: Good  EVALUATION COMPLEXITY: Moderate    PLAN:  OT FREQUENCY: 2x/week  OT DURATION: 4 weeks  PLANNED INTERVENTIONS: 97168 OT Re-evaluation, 97535 self care/ADL training, 62694 therapeutic exercise, 97530 therapeutic activity, 97112 neuromuscular re-education, 97140 manual therapy, 97035 ultrasound, 97010 moist heat, 97010 cryotherapy, 97032 electrical stimulation (manual), compression bandaging, visual/perceptual remediation/compensation, psychosocial skills training, energy conservation, coping strategies training, patient/family education, and DME and/or AE instructions  RECOMMENDED OTHER SERVICES: NA  CONSULTED AND AGREED WITH PLAN OF CARE: Patient  PLAN FOR NEXT SESSION: review coordination HEP, engage in WB against wall vs counter/elevated mat table vs quadruped (does have bad back), education on strategies to improve gross motor control/ataxia   Rosalio Loud, OTR/L 03/14/2024, 9:48 AM   [facility] at [facility] [address] [place], [place] [address] Phone # [phone] Fax # [phone]

## 2024-03-14 NOTE — Progress Notes (Signed)
 Electrophysiology Office Note:   Date:  03/15/2024  ID:  ORA MCNATT, DOB [birth year], MRN [id number]  Primary Cardiologist: None Electrophysiologist: Nobie Putnam, MD      History of Present Illness:   [name] is a 63 y.o. male with h/o persistent afib and flutter, s/p ablation x 2 and convergent surgery on 11/2021 at [facility], HTN, non-obstructive CAD and right cerebellar stroke 03/01/24 who is being seen today for evaluation for Watchman device implant.  Discussed the use of AI scribe software for clinical note transcription with the patient, who gave verbal consent to proceed.  History of Present Illness [name] "[name]" is a 63 year old male with atrial fibrillation and stroke who presents for evaluation of anticoagulation management. He has a history of atrial fibrillation and underwent convergent surgery at [facility] after two ablations by Dr. [name]. He remained episode-free for approximately a year and a half post-surgery, leading to the discontinuation of anticoagulation. However, he recently experienced a stroke, with uncertainty regarding a second event, prompting the resumption of Eliquis. 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 He has a loop recorder implanted, which has not been checked in almost 1 year. The last download was performed remotely in April 2024. He stopped monitoring due to high costs and perceived lack of necessity after being taken off Eliquis. He wants the loop recorder data downloaded to check for atrial fibrillation, as he occasionally feels palpitations but cannot consistently differentiate between atrial fibrillation and premature atrial contractions which he has had for many years. This is not new, as he occasionally felt his heart go out of rhythm before his last surgery, but was not always able to distinguish it from other sensations. He is currently undergoing rehabilitation for his  hand, which is the only residual issue following his stroke.   Review of systems complete and found to be negative unless listed in HPI.   EP Information / Studies Reviewed:    EKG is not ordered today. EKG from 03/13/24 reviewed which showed sinus with PACs.      EKG 02/13/22: AFL   Echo 03/01/24:  1. Left ventricular ejection fraction, by estimation, is 60 to 65%. The  left ventricle has normal function. The left ventricle has no regional  wall motion abnormalities. Left ventricular diastolic parameters were  normal.   2. Right ventricular systolic function is normal. The right ventricular  size is normal.   3. Left atrial size was severely dilated.   4. Right atrial size was moderately dilated.   5. The mitral valve is abnormal. Mild mitral valve regurgitation. No  evidence of mitral stenosis.   6. The aortic valve is tricuspid. Aortic valve regurgitation is not  visualized. No aortic stenosis is present.   7. The inferior vena cava is normal in size with greater than 50%  respiratory variability, suggesting right atrial pressure of 3 mmHg.   LHC 10/01/21:  CONCLUSIONS: Right dominant coronary anatomy. Diffuse three-vessel atherosclerosis and calcification without focal obstructive disease. Low normal to mildly depressed LV systolic function, estimated EF 45%.  LVEDP is normal.  Risk Assessment/Calculations:    CHA2DS2-VASc Score = 4   This indicates a 4.8% annual risk of stroke. The patient's score is based upon: CHF History: 0 HTN History: 1 Diabetes History: 0 Stroke History: 2 Vascular Disease History: 1 Age Score: 0 Gender Score: 0             Physical  Exam:   VS:  BP 138/66   Pulse 90   Ht 5\' 9"  (1.753 m)   Wt 232 lb 3.2 oz (105.3 kg)   SpO2 99%   BMI 34.29 kg/m    Wt Readings from Last 3 Encounters:  03/15/24 232 lb 3.2 oz (105.3 kg)  03/13/24 232 lb 9.6 oz (105.5 kg)  03/01/24 244 lb 11.4 oz (111 kg)     GEN: Well nourished, well developed in no  acute distress NECK: No JVD CARDIAC: Normal rate, irregular rhythm RESPIRATORY:  Clear to auscultation without rales, wheezing or rhonchi  ABDOMEN: Soft, non-distended EXTREMITIES:  No edema; No deformity   ASSESSMENT AND PLAN:    # Persistent atrial fibrillation s/p ablation x 2 and convergent surgery at [facility] in 2022: Loop recorder was interrogated, confirming atrial fibrillation. Now paroxysmal. Overall burden is low, 4%, and he appears to be asymptomatic. Recurrence of AF could be etiology of his stroke.  # Secondary hypercoagulable state due to atrial fibrillation: CHADSVASC score of 4.  - Resume remote monitoring of his loop recorder. If AF burden increasing or he develops symptoms associated with AF then we can discuss options for AAD therapy or AVJ ablation and PPM. Unlikely to benefit from additional ablation procedures.  - Continue atenolol.  - Continue anti-coagulation with Eliqius. Patient not interested in Watchman device at this time.   # Recent R cerebellar stroke: Suspicion for cardioembolic etiology in setting of AF off anti-coagulation.  - Continue Eliquis.  - Continue rehab and follow up with neurology.   #Hypertension -Near goal today.  Recommend checking blood pressures 1-2 times per week at home and recording the values.  Recommend bringing these recordings to the primary care physician.  Follow up with Dr. Jimmey Ralph in 6 months  Signed, Nobie Putnam, MD

## 2024-03-14 NOTE — Telephone Encounter (Signed)
 [name]: Poor CT image quality Max 29/ AVG 26/ Depth 16.2 Likely use a 31mm device (will clarify with TEE images on case day) Inf/Mid TSP RAO 15 CAU 16

## 2024-03-15 ENCOUNTER — Telehealth: Payer: Self-pay | Admitting: Cardiology

## 2024-03-15 ENCOUNTER — Ambulatory Visit: Attending: Cardiology | Admitting: Cardiology

## 2024-03-15 ENCOUNTER — Encounter: Payer: Self-pay | Admitting: Cardiology

## 2024-03-15 VITALS — BP 138/66 | HR 90 | Ht 69.0 in | Wt 232.2 lb

## 2024-03-15 DIAGNOSIS — D6869 Other thrombophilia: Secondary | ICD-10-CM | POA: Diagnosis not present

## 2024-03-15 DIAGNOSIS — I4819 Other persistent atrial fibrillation: Secondary | ICD-10-CM

## 2024-03-15 DIAGNOSIS — Z8673 Personal history of transient ischemic attack (TIA), and cerebral infarction without residual deficits: Secondary | ICD-10-CM | POA: Diagnosis not present

## 2024-03-15 DIAGNOSIS — I10 Essential (primary) hypertension: Secondary | ICD-10-CM | POA: Diagnosis not present

## 2024-03-15 NOTE — Patient Instructions (Signed)
Medication Instructions:  Your physician recommends that you continue on your current medications as directed. Please refer to the Current Medication list given to you today.  *If you need a refill on your cardiac medications before your next appointment, please call your pharmacy*  Follow-Up: At [facility], you and your health needs are our priority.  As part of our continuing mission to provide you with exceptional heart care, we have created designated Provider Care Teams.  These Care Teams include your primary Cardiologist (physician) and Advanced Practice Providers (APPs -  Physician Assistants and Nurse Practitioners) who all work together to provide you with the care you need, when you need it.  Your next appointment:   6 months  Provider:   Nobie Putnam, MD

## 2024-03-15 NOTE — Telephone Encounter (Signed)
 I spoke with the pt and got his monitor working correctly.

## 2024-03-15 NOTE — Telephone Encounter (Signed)
 Spoke with pt who states since plugging in his ILR monitor it is "beeping and lights are flickering."  Pt wants to make sure everything is working correctly. Pt advised will forward to device clinic for further review.  Pt verbalizes understanding and thanked Charity fundraiser for the call.

## 2024-03-15 NOTE — Telephone Encounter (Signed)
 Patient is calling to speak to the nurse. Pleasure advis

## 2024-03-18 ENCOUNTER — Ambulatory Visit (HOSPITAL_COMMUNITY): Admitting: Physician Assistant

## 2024-03-18 ENCOUNTER — Telehealth: Payer: Self-pay

## 2024-03-18 NOTE — Telephone Encounter (Signed)
 I ordered the pt a new monitor. He should receive it in 7-10 business days.

## 2024-03-19 ENCOUNTER — Ambulatory Visit: Admitting: Occupational Therapy

## 2024-03-19 DIAGNOSIS — M6281 Muscle weakness (generalized): Secondary | ICD-10-CM

## 2024-03-19 DIAGNOSIS — R278 Other lack of coordination: Secondary | ICD-10-CM

## 2024-03-19 DIAGNOSIS — I69351 Hemiplegia and hemiparesis following cerebral infarction affecting right dominant side: Secondary | ICD-10-CM | POA: Diagnosis not present

## 2024-03-19 NOTE — Therapy (Signed)
 OUTPATIENT OCCUPATIONAL THERAPY NEURO  Treatment  Patient Name: [name] MRN: [id number] DOB:1961/03/22, 63 y.o., male 24 Date: 03/19/2024  PCP: [name], MD REFERRING PROVIDER: [name], MD  END OF SESSION:  OT End of Session - 03/19/24 1021     Visit Number 2    Number of Visits 7    Date for OT Re-Evaluation 04/12/24    Authorization Type [employer] [id number]    OT Start Time 0802    OT Stop Time 0847    OT Time Calculation (min) 45 min    Activity Tolerance Patient tolerated treatment well    Behavior During Therapy [facility] for tasks assessed/performed              Past Medical History:  Diagnosis Date   Arthritis    Atrial flutter (HCC)    diagnosed 2005   Depression    Diverticular disease    GERD (gastroesophageal reflux disease)    Obesity    Paroxysmal atrial fibrillation (HCC)    Primary localized osteoarthritis of left knee    Past Surgical History:  Procedure Laterality Date   ATRIAL FIBRILLATION ABLATION N/A 01/24/2019   Procedure: ATRIAL FIBRILLATION ABLATION;  Surgeon: Hillis Range, MD;  Location: [facility];  Service: Cardiovascular;  Laterality: N/A;   ATRIAL FIBRILLATION ABLATION N/A 04/14/2020   Procedure: ATRIAL FIBRILLATION ABLATION;  Surgeon: Hillis Range, MD;  Location: [facility];  Service: Cardiovascular;  Laterality: N/A;   CARDIOVERSION N/A 12/06/2018   Procedure: CARDIOVERSION;  Surgeon: Parke Poisson, MD;  Location: [facility] ENDOSCOPY;  Service: Cardiovascular;  Laterality: N/A;   CARDIOVERSION N/A 03/13/2019   Procedure: CARDIOVERSION;  Surgeon: Wendall Stade, MD;  Location: [facility] ENDOSCOPY;  Service: Cardiovascular;  Laterality: N/A;   CARDIOVERSION N/A 03/05/2020   Procedure: CARDIOVERSION;  Surgeon: Sande Rives, MD;  Location: [facility] ENDOSCOPY;  Service: Cardiovascular;  Laterality: N/A;   COLON SURGERY  12/07, 5/08, 10/08   COLONOSCOPY     COLOSTOMY  2007   COLOSTOMY REVERSAL     EYE SURGERY  Bilateral    cataracts   INCISIONAL HERNIA REPAIR  2008   with mesh   KNEE ARTHROSCOPY Left 5/10   x2   LEFT HEART CATH AND CORONARY ANGIOGRAPHY N/A 10/01/2021   Procedure: LEFT HEART CATH AND CORONARY ANGIOGRAPHY;  Surgeon: Lyn Records, MD;  Location: [facility];  Service: Cardiovascular;  Laterality: N/A;   MULTIPLE TOOTH EXTRACTIONS     neck fusion  5/11   REPLANTATION THUMB Right    TONSILLECTOMY     TOTAL KNEE ARTHROPLASTY Left 08/23/2017   TOTAL KNEE ARTHROPLASTY Left 08/23/2017   Procedure: LEFT TOTAL KNEE ARTHROPLASTY;  Surgeon: Salvatore Marvel, MD;  Location: [facility] OR;  Service: Orthopedics;  Laterality: Left;   WISDOM TOOTH EXTRACTION     Patient Active Problem List   Diagnosis Date Noted   Stroke (cerebrum) (HCC) 03/01/2024   Hypercoagulable state due to longstanding persistent atrial fibrillation (HCC) 01/24/2024   Symptoms of upper respiratory infection (URI) 02/13/2022   Essential hypertension 02/13/2022   Chest pain of uncertain etiology    CAD in native artery    DOE (dyspnea on exertion)    Atypical atrial flutter (HCC) 05/12/2020   Atrial fibrillation with RVR (HCC) 12/05/2018   Atrial fibrillation (HCC) 08/08/2017   Diverticular disease    Primary localized osteoarthritis of left knee    Spinal stenosis 11/19/2014   Diarrhea 07/26/2012    ONSET DATE: 03/01/24  REFERRING DIAG: Z64.73 (ICD-10-CM) - Personal history of transient ischemic attack (TIA), and cerebral infarction without residual deficits  THERAPY DIAG:  Hemiplegia and hemiparesis following cerebral infarction affecting right dominant side (HCC)  Muscle weakness (generalized)  Other lack of coordination  Rationale for Evaluation and Treatment: Rehabilitation  SUBJECTIVE:   SUBJECTIVE STATEMENT: Pt reports hands are sore as he has been doing a lot of work with them.  Pt reports that he has returned to work with only concerns being his handwriting.    Pt accompanied by:  self  PERTINENT HISTORY: 63 yo male presented to [facility] with dizziness and R sided headache on 03/01/24. MRI showing: Acute Right SCA cerebellar infarct with petechial hemorrhage no significant mass effect, TNK administered on 3/7. Pt returned to ED on 3/14 with similar symptoms as above.  MRI Brain report states small new acute infarcts in R cerebellum. However, on stroke team review, mostly evolution of the recent stroke, do not see definite clearly new strokes. Per PCP note, pt still finds some mild difficulty with mobility of right arm, but no weakness on examination today. It is felt that stroke was triggered by recurrent A-fib. Pt now back on Eliquis.  PMH: hx of R cerebellar stroke (discharged from [facility]  3/8), Aflutter/Afib (just started on eliquis), Depression, Arthritis, GERD, HTN, and chronic pain  PRECAUTIONS: None  WEIGHT BEARING RESTRICTIONS: No  PAIN:  Are you having pain? Yes: NPRS scale: 5/10 Pain location: back Pain description: chronic pain due to multiple spinal cord/back surgeries and spinal stenosis Aggravating factors: crawling and moving heavy objects - work related tasks Relieving factors: reports mornings are his best time in regards to pain  FALLS: Has patient fallen in last 6 months? 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  HAND DOMINANCE: Right  ADLs: Overall  ADLs: Independent with bathing and dressing tasks Transfers/ambulation related to ADLs: Mod I - Independent with no AD Eating: "hesitancy" and decreased control with scooping foods Grooming: reports brushing teeth is "different" Equipment: Shower seat without back and Grab bars  IADLs: Meal Prep: "hesitancy" and decreased motor control when picking up tongs and flipping food, shaking on seasoning, asking for assistance with cutting cantaloupe  Community mobility: driving Medication management: Mod I Handwriting:  "I wrist real small now".  Pt reports handwriting 2/10 on legibility compared to prior to stroke.  PPT #1: (whales live in a blue ocean): 16.19 sec  MOBILITY STATUS: Independent  POSTURE COMMENTS:  No Significant postural limitations  ACTIVITY TOLERANCE: Activity tolerance: WFL for tasks assessed on eval  FUNCTIONAL OUTCOME MEASURES: PSFS 3.7   UPPER EXTREMITY ROM:    Active ROM Right eval Left eval  Shoulder flexion WFL - mild decreased motor control with shoulder movements WFL  Shoulder abduction [facility]  Shoulder adduction    Shoulder extension    Shoulder internal rotation [facility]  Shoulder external rotation [facility]  Elbow flexion [facility] WFL  Elbow extension [facility] WFL  Wrist flexion    Wrist extension    Wrist ulnar deviation    Wrist radial deviation    Wrist pronation    Wrist supination    (Blank rows = not tested)  UPPER EXTREMITY MMT:     MMT Right eval Left eval  Shoulder flexion 4- 5  Shoulder abduction    Shoulder adduction    Shoulder extension    Shoulder internal rotation    Shoulder external rotation    Middle trapezius    Lower trapezius    Elbow flexion 4+ 5  Elbow extension 4+ 5  Wrist flexion    Wrist extension    Wrist ulnar deviation    Wrist radial deviation    Wrist pronation    Wrist supination    (Blank rows = not tested)  HAND FUNCTION: Grip strength: Right: 87 lbs; Left: 92 lbs, Lateral pinch: Right: 27 lbs, Left: 23  lbs, and 3 point pinch: Right: 20 lbs, Left: 20 lbs  COORDINATION: Finger Nose Finger test: Mild dysmetria on R and slower compared to L 9 Hole Peg test: Right: 37.13 sec; Left: 29.28 sec Box and Blocks:  Right 47 blocks, Left 57 blocks  SENSATION: WFL  COGNITION: Overall cognitive status: Within functional limits for tasks assessed  VISION: Subjective report: "blurriness" reports wearing reading glasses more than before; has scar on L eye due to a stick going through his eye Baseline vision: Wears glasses for reading only and h/o cataract removal Visual history: cataracts removal  VISION ASSESSMENT: Not tested  OBSERVATIONS: Impaired motor control of RUE noted with shoulder ROM, most specifically with shoulder flexion.  Pt demonstrating "clumsy" movements with coordination assessment and dysmetria with finger to nose assessment on R.                                                                                                                           TREATMENT DATE:  03/19/24 Coordination: engaged in rotating 2 golf balls in R hand clockwise and counterclockwise.  Pt demonstrating more difficulty with rotating counterclockwise.  Downgraded to completing with "Chinese balls" (smaller, smoother balls) with improved control, still min difficulty when rotating counter clockwise.  Pt able to progress to attempting task without visually attending to task with min challenge.   GMC: engaged in placing golf balls onto cones with tongs to challenge motor control and functional reach and grasp.  Pt demonstrating min ataxia when reaching towards cones both when placing and when removing balls.  Transitioned to placing and removing rings from vertical stack of cones with challenge to not touch the side of the cones.  Pt completing with good effort, touching sides of cones ~30% of time. Handwriting: OT educated on various pre-writing tasks to allow for increased hand movements to facilitate improved  handwriting.  Pt completing word search puzzle to allow for increased UE movement and hand gliding over paper to carry over  to handwriting.  Engaged in flipping coin in between thumb and first 3 fingers to challenge coordination as needed for improved control and coordination of pen with writing. OT reiterated pen tricks for coordination as needed for handwriting.     03/14/24  Educated on [facility] tasks with picking up variety of small items, stacking/unstacking coins, and placing items into container with progressively smaller target to address motor control.  Provided with handout - see pt instructions.    PATIENT EDUCATION: Education details: Educated on role and purpose of OT as well as potential interventions and goals for therapy based on initial evaluation findings. Person educated: Patient Education method: Explanation and Handouts Education comprehension: verbalized understanding and needs further education  HOME EXERCISE PROGRAM: 03/14/24 - coordination HEP (see pt instructions)   GOALS: Goals reviewed with patient? Yes   LONG TERM GOALS: Target date: 04/12/24  Pt will be independent with coordination and strengthening HEP with use of handouts. Baseline: new to OP OT Goal status:  IN PROGRESS  2.  Pt will demonstrated improvement in speed and legibility with handwriting as evidenced by improvement of legibility to 6/10 and writing simple sentence in <12 seconds. Baseline: PPT #1: 16.19 sec and pt reporting legibility 2/10 Goal status:  IN PROGRESS  3.  Pt will demonstrate improved motor control of RUE to engage in toothbrushing and shaking seasoning with cooking as evidenced by improved score on Box and Blocks assessment by 6 blocks with RUE. Baseline: R: 47 and L: 57 blocks Goal status:  IN PROGRESS  4.  Pt will demonstrate improved FMC as needed for ADLs and IADLs as evidenced by improved score on 9 hole peg test by 5 seconds with RUE. Baseline: Right: 37.13 sec; Left:  29.28 sec Goal status: IN PROGRESS  5.  Patient will report at least two-point increase in average PSFS score or at least three-point increase in a single activity score indicating functionally significant improvement given minimum detectable change. Baseline: 3.7 score Goal status: IN PROGRESS  ASSESSMENT:  CLINICAL IMPRESSION: Patient tolerated activities well.  Pt continues to demonstrate mild to moderate ataxia at end range when completing gross motor control tasks. Pt reports fluid and loose feelings in distal arm when in low range, however with focus on "tensing" and attention to tasks pt able to complete tasks with improved control. Pt will continue to benefit from skilled occupational therapy services to address strength and coordination, GM/FM control, safety awareness, introduction of compensatory strategies/AE prn, visual-perception, and implementation of an HEP to improve participation and safety during ADLs and IADLs.    PERFORMANCE DEFICITS: in functional skills including ADLs, IADLs, coordination, proprioception, ROM, strength, pain, Fine motor control, Gross motor control, body mechanics, decreased knowledge of precautions, decreased knowledge of use of DME, vision, and UE functional use and psychosocial skills including environmental adaptation and routines and behaviors.     PLAN:  OT FREQUENCY: 2x/week  OT DURATION: 4 weeks  PLANNED INTERVENTIONS: 97168 OT Re-evaluation, 97535 self care/ADL training, 16109 therapeutic exercise, 97530 therapeutic activity, 97112 neuromuscular re-education, 97140 manual therapy, 97035 ultrasound, 97010 moist heat, 97010 cryotherapy, 97032 electrical stimulation (manual), compression bandaging, visual/perceptual remediation/compensation, psychosocial skills training, energy conservation, coping strategies training, patient/family education, and DME and/or AE instructions  RECOMMENDED OTHER SERVICES: NA  CONSULTED AND AGREED WITH PLAN OF  CARE: Patient  PLAN FOR NEXT SESSION: review coordination HEP, engage in WB against wall vs counter/elevated mat table vs quadruped (does have bad back), education on strategies to improve gross motor control/ataxia  Rosalio Loud, OTR/L 03/19/2024, 10:22 AM   [facility] [address] [place], [address] [place], [place] [address] Phone # [phone] Fax # [phone]

## 2024-03-21 ENCOUNTER — Ambulatory Visit: Admitting: Occupational Therapy

## 2024-03-21 DIAGNOSIS — M6281 Muscle weakness (generalized): Secondary | ICD-10-CM

## 2024-03-21 DIAGNOSIS — I69351 Hemiplegia and hemiparesis following cerebral infarction affecting right dominant side: Secondary | ICD-10-CM | POA: Diagnosis not present

## 2024-03-21 DIAGNOSIS — R278 Other lack of coordination: Secondary | ICD-10-CM

## 2024-03-21 NOTE — Patient Instructions (Signed)
 Marland Kitchen

## 2024-03-21 NOTE — Therapy (Addendum)
 OUTPATIENT OCCUPATIONAL THERAPY NEURO  Treatment  Patient Name: [name] MRN: [id number] DOB:1961/09/07, 63 y.o., male 61 Date: 03/21/2024  PCP: [name], MD REFERRING PROVIDER: [name], MD  END OF SESSION:  OT End of Session - 03/21/24 0902     Visit Number 3    Number of Visits 7    Date for OT Re-Evaluation 04/12/24    Authorization Type [employer] [id number]    OT Start Time 0800    OT Stop Time 0845    OT Time Calculation (min) 45 min    Activity Tolerance Patient tolerated treatment well    Behavior During Therapy WFL for tasks assessed/performed               Past Medical History:  Diagnosis Date   Arthritis    Atrial flutter (HCC)    diagnosed 2005   Depression    Diverticular disease    GERD (gastroesophageal reflux disease)    Obesity    Paroxysmal atrial fibrillation (HCC)    Primary localized osteoarthritis of left knee    Past Surgical History:  Procedure Laterality Date   ATRIAL FIBRILLATION ABLATION N/A 01/24/2019   Procedure: ATRIAL FIBRILLATION ABLATION;  Surgeon: Hillis Range, MD;  Location: [facility];  Service: Cardiovascular;  Laterality: N/A;   ATRIAL FIBRILLATION ABLATION N/A 04/14/2020   Procedure: ATRIAL FIBRILLATION ABLATION;  Surgeon: Hillis Range, MD;  Location: [facility];  Service: Cardiovascular;  Laterality: N/A;   CARDIOVERSION N/A 12/06/2018   Procedure: CARDIOVERSION;  Surgeon: Parke Poisson, MD;  Location: [facility] ENDOSCOPY;  Service: Cardiovascular;  Laterality: N/A;   CARDIOVERSION N/A 03/13/2019   Procedure: CARDIOVERSION;  Surgeon: Wendall Stade, MD;  Location: [facility] ENDOSCOPY;  Service: Cardiovascular;  Laterality: N/A;   CARDIOVERSION N/A 03/05/2020   Procedure: CARDIOVERSION;  Surgeon: Sande Rives, MD;  Location: [facility] ENDOSCOPY;  Service: Cardiovascular;  Laterality: N/A;   COLON SURGERY  12/07, 5/08, 10/08   COLONOSCOPY     COLOSTOMY  2007   COLOSTOMY REVERSAL     EYE SURGERY  Bilateral    cataracts   INCISIONAL HERNIA REPAIR  2008   with mesh   KNEE ARTHROSCOPY Left 5/10   x2   LEFT HEART CATH AND CORONARY ANGIOGRAPHY N/A 10/01/2021   Procedure: LEFT HEART CATH AND CORONARY ANGIOGRAPHY;  Surgeon: Lyn Records, MD;  Location: [facility];  Service: Cardiovascular;  Laterality: N/A;   MULTIPLE TOOTH EXTRACTIONS     neck fusion  5/11   REPLANTATION THUMB Right    TONSILLECTOMY     TOTAL KNEE ARTHROPLASTY Left 08/23/2017   TOTAL KNEE ARTHROPLASTY Left 08/23/2017   Procedure: LEFT TOTAL KNEE ARTHROPLASTY;  Surgeon: Salvatore Marvel, MD;  Location: [facility] OR;  Service: Orthopedics;  Laterality: Left;   WISDOM TOOTH EXTRACTION     Patient Active Problem List   Diagnosis Date Noted   Stroke (cerebrum) (HCC) 03/01/2024   Hypercoagulable state due to longstanding persistent atrial fibrillation (HCC) 01/24/2024   Symptoms of upper respiratory infection (URI) 02/13/2022   Essential hypertension 02/13/2022   Chest pain of uncertain etiology    CAD in native artery    DOE (dyspnea on exertion)    Atypical atrial flutter (HCC) 05/12/2020   Atrial fibrillation with RVR (HCC) 12/05/2018   Atrial fibrillation (HCC) 08/08/2017   Diverticular disease    Primary localized osteoarthritis of left knee    Spinal stenosis 11/19/2014   Diarrhea 07/26/2012    ONSET DATE:  03/01/24  REFERRING DIAG: V40.98 (ICD-10-CM) - Personal history of transient ischemic attack (TIA), and cerebral infarction without residual deficits  THERAPY DIAG:  Hemiplegia and hemiparesis following cerebral infarction affecting right dominant side (HCC)  Muscle weakness (generalized)  Other lack of coordination  Rationale for Evaluation and Treatment: Rehabilitation  SUBJECTIVE:   SUBJECTIVE STATEMENT: Pt reports pleased with his dexterity after a task at work even including removing screws and reaching away from body, however still noticing "waving" movement at he approaches target.  Pt  accompanied by: self  PERTINENT HISTORY: 63 yo male presented to [facility] with dizziness and R sided headache on 03/01/24. MRI showing: Acute Right SCA cerebellar infarct with petechial hemorrhage no significant mass effect, TNK administered on 3/7. Pt returned to ED on 3/14 with similar symptoms as above.  MRI Brain report states small new acute infarcts in R cerebellum. However, on stroke team review, mostly evolution of the recent stroke, do not see definite clearly new strokes. Per PCP note, pt still finds some mild difficulty with mobility of right arm, but no weakness on examination today. It is felt that stroke was triggered by recurrent A-fib. Pt now back on Eliquis.  PMH: hx of R cerebellar stroke (discharged from [facility]  3/8), Aflutter/Afib (just started on eliquis), Depression, Arthritis, GERD, HTN, and chronic pain  PRECAUTIONS: None  WEIGHT BEARING RESTRICTIONS: No  PAIN:  Are you having pain? Yes: NPRS scale: 3-5/10 Pain location: back Pain description: chronic pain due to multiple spinal cord/back surgeries and spinal stenosis Aggravating factors: crawling and moving heavy objects - work related tasks Relieving factors: reports mornings are his best time in regards to pain  FALLS: Has patient fallen in last 6 months? 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  HAND DOMINANCE:  Right  ADLs: Overall ADLs: Independent with bathing and dressing tasks Transfers/ambulation related to ADLs: Mod I - Independent with no AD Eating: "hesitancy" and decreased control with scooping foods Grooming: reports brushing teeth is "different" Equipment: Shower seat without back and Grab bars  IADLs: Meal Prep: "hesitancy" and decreased motor control when picking up tongs and flipping food, shaking on seasoning, asking for assistance with cutting cantaloupe  Community mobility: driving Medication management: Mod I Handwriting:  "I wrist real small now".  Pt reports handwriting 2/10 on legibility compared to prior to stroke.  PPT #1: (whales live in a blue ocean): 16.19 sec  MOBILITY STATUS: Independent  POSTURE COMMENTS:  No Significant postural limitations  ACTIVITY TOLERANCE: Activity tolerance: WFL for tasks assessed on eval  FUNCTIONAL OUTCOME MEASURES: PSFS 3.7   UPPER EXTREMITY ROM:    Active ROM Right eval Left eval  Shoulder flexion WFL - mild decreased motor control with shoulder movements WFL  Shoulder abduction [facility]  Shoulder adduction    Shoulder extension    Shoulder internal rotation [facility]  Shoulder external rotation [facility] WFL  Elbow flexion  WFL WFL  Elbow extension [facility] WFL  Wrist flexion    Wrist extension    Wrist ulnar deviation    Wrist radial deviation    Wrist pronation    Wrist supination    (Blank rows = not tested)  UPPER EXTREMITY MMT:     MMT Right eval Left eval  Shoulder flexion 4- 5  Shoulder abduction    Shoulder adduction    Shoulder extension    Shoulder internal rotation    Shoulder external rotation    Middle trapezius    Lower trapezius    Elbow flexion 4+ 5  Elbow extension 4+ 5  Wrist flexion    Wrist extension    Wrist ulnar deviation    Wrist radial deviation    Wrist pronation    Wrist supination    (Blank rows = not tested)  HAND FUNCTION: Grip strength: Right: 87 lbs; Left: 92 lbs, Lateral pinch:  Right: 27 lbs, Left: 23 lbs, and 3 point pinch: Right: 20 lbs, Left: 20 lbs  COORDINATION: Finger Nose Finger test: Mild dysmetria on R and slower compared to L 9 Hole Peg test: Right: 37.13 sec; Left: 29.28 sec Box and Blocks:  Right 47 blocks, Left 57 blocks  SENSATION: WFL  COGNITION: Overall cognitive status: Within functional limits for tasks assessed  VISION: Subjective report: "blurriness" reports wearing reading glasses more than before; has scar on L eye due to a stick going through his eye Baseline vision: Wears glasses for reading only and h/o cataract removal Visual history: cataracts removal  VISION ASSESSMENT: Not tested  OBSERVATIONS: Impaired motor control of RUE noted with shoulder ROM, most specifically with shoulder flexion.  Pt demonstrating "clumsy" movements with coordination assessment and dysmetria with finger to nose assessment on R.                                                                                                                           TREATMENT DATE:  03/21/24 NMR:  WB standing at counter top - with focus on improved motor control.  Initially completing modified pushups at counter top progressing to alternating shoulder taps and then progressing to tapping target on vertical surface for further precision control.  OT educating on rationale of WB through effected RUE as well as tapping with RUE for motor control.   clock tapping at wall - with alternating UE to facilitate WB as well as motor control.  Pt continues to demonstrate mild ataxia, especially at distal range/when approaching target., tapping dot and then nose. Tapping shapes on cards - with focus on gross motor control with precision tapping, alternating between tapping shapes and nose to challenge motor control.   Sliding coin - from target to target to facilitate increased motor control.  Educated on sliding checker pieces from space to space and/or coins from target to  target. Stacking [place] - with focus on motor control, initially stacking in vertical position with full arm extension, progressing to  stacking in 3 piece bridge form (side, side, top) with progressively higher reaching. Trail making - engaged in precision sliding with dragging finger from dot to dot in sequential order to challenge motor control with direction changes.    03/19/24 Coordination: engaged in rotating 2 golf balls in R hand clockwise and counterclockwise.  Pt demonstrating more difficulty with rotating counterclockwise.  Downgraded to completing with "Chinese balls" (smaller, smoother balls) with improved control, still min difficulty when rotating counter clockwise.  Pt able to progress to attempting task without visually attending to task with min challenge.   GMC: engaged in placing golf balls onto cones with tongs to challenge motor control and functional reach and grasp.  Pt demonstrating min ataxia when reaching towards cones both when placing and when removing balls.  Transitioned to placing and removing rings from vertical stack of cones with challenge to not touch the side of the cones.  Pt completing with good effort, touching sides of cones ~30% of time. Handwriting: OT educated on various pre-writing tasks to allow for increased hand movements to facilitate improved handwriting.  Pt completing word search puzzle to allow for increased UE movement and hand gliding over paper to carry over to handwriting.  Engaged in flipping coin in between thumb and first 3 fingers to challenge coordination as needed for improved control and coordination of pen with writing. OT reiterated pen tricks for coordination as needed for handwriting.     03/14/24  Educated on [facility] tasks with picking up variety of small items, stacking/unstacking coins, and placing items into container with progressively smaller target to address motor control.  Provided with handout - see pt instructions.    PATIENT  EDUCATION: Education details: Educated on role and purpose of OT as well as potential interventions and goals for therapy based on initial evaluation findings. Person educated: Patient Education method: Explanation and Handouts Education comprehension: verbalized understanding and needs further education  HOME EXERCISE PROGRAM: 03/14/24 - coordination HEP (see pt instructions)  03/21/24 - GMC tasks (see pt instructions)  Access Code: [email or web address] URL: [email or web address] Date: 03/21/2024 Prepared by: [facility] - Outpatient  Rehab - [facility]  Exercises - Seated Shoulder Blade Squeeze  - 2 sets - 10 reps - Wall Push Up  - 2 sets - 10 reps - Full Plank on Counter, Opposite Shoulder Taps  - 2 sets - 10 reps   GOALS: Goals reviewed with patient? Yes   LONG TERM GOALS: Target date: 04/12/24  Pt will be independent with coordination and strengthening HEP with use of handouts. Baseline: new to OP OT Goal status:  IN PROGRESS  2.  Pt will demonstrated improvement in speed and legibility with handwriting as evidenced by improvement of legibility to 6/10 and writing simple sentence in <12 seconds. Baseline: PPT #1: 16.19 sec and pt reporting legibility 2/10 Goal status:  IN PROGRESS  3.  Pt will demonstrate improved motor control of RUE to engage in toothbrushing and shaking seasoning with cooking as evidenced by improved score on Box and Blocks assessment by 6 blocks with RUE. Baseline: R: 47 and L: 57 blocks Goal status:  IN PROGRESS  4.  Pt will demonstrate improved FMC as needed for ADLs and IADLs as evidenced by improved score on 9 hole peg test by 5 seconds with RUE. Baseline: Right: 37.13 sec; Left: 29.28 sec Goal status: IN PROGRESS  5.  Patient will report at least two-point increase in average PSFS score or at least three-point increase in a single  activity score indicating functionally significant improvement given minimum detectable change. Baseline: 3.7  score Goal status: IN PROGRESS  ASSESSMENT:  CLINICAL IMPRESSION: Patient tolerated activities this session.  Pt continues to demonstrate mild ataxia at end range when completing gross motor control tasks, therefore increased focus placed on precision movements with tapping and reaching towards various targets in various planes  Pt will continue to benefit from skilled occupational therapy services to address strength and coordination, GM/FM control, safety awareness, introduction of compensatory strategies/AE prn, visual-perception, and implementation of an HEP to improve participation and safety during ADLs and IADLs.    PERFORMANCE DEFICITS: in functional skills including ADLs, IADLs, coordination, proprioception, ROM, strength, pain, Fine motor control, Gross motor control, body mechanics, decreased knowledge of precautions, decreased knowledge of use of DME, vision, and UE functional use and psychosocial skills including environmental adaptation and routines and behaviors.     PLAN:  OT FREQUENCY: 2x/week  OT DURATION: 4 weeks  PLANNED INTERVENTIONS: 97168 OT Re-evaluation, 97535 self care/ADL training, 96295 therapeutic exercise, 97530 therapeutic activity, 97112 neuromuscular re-education, 97140 manual therapy, 97035 ultrasound, 97010 moist heat, 97010 cryotherapy, 97032 electrical stimulation (manual), compression bandaging, visual/perceptual remediation/compensation, psychosocial skills training, energy conservation, coping strategies training, patient/family education, and DME and/or AE instructions  RECOMMENDED OTHER SERVICES: NA  CONSULTED AND AGREED WITH PLAN OF CARE: Patient  PLAN FOR NEXT SESSION: engage in WB against wall vs counter/elevated mat table vs quadruped (does have bad back), education on strategies to improve gross motor control/ataxia - continue to progress to various planes and placing pegs in vertical position and pouring items.   Rosalio Loud,  OTR/L 03/21/2024, 9:03 AM   [facility] at [facility] [address] [place], [address] [place], [place] [address] Phone # [phone] Fax # [phone]

## 2024-03-22 NOTE — Telephone Encounter (Signed)
 Pt called letting us know he has received the monitor and will plug it in and press the button to get it paired.

## 2024-03-26 ENCOUNTER — Ambulatory Visit: Attending: Family Medicine | Admitting: Occupational Therapy

## 2024-03-26 DIAGNOSIS — M6281 Muscle weakness (generalized): Secondary | ICD-10-CM | POA: Diagnosis present

## 2024-03-26 DIAGNOSIS — R270 Ataxia, unspecified: Secondary | ICD-10-CM | POA: Diagnosis present

## 2024-03-26 DIAGNOSIS — I69351 Hemiplegia and hemiparesis following cerebral infarction affecting right dominant side: Secondary | ICD-10-CM | POA: Insufficient documentation

## 2024-03-26 DIAGNOSIS — R278 Other lack of coordination: Secondary | ICD-10-CM | POA: Diagnosis present

## 2024-03-26 NOTE — Therapy (Addendum)
 OUTPATIENT OCCUPATIONAL THERAPY NEURO  Treatment  Patient Name: [name] MRN: [id number] DOB:[birth year], 63 y.o., male Today's Date: 03/26/2024  PCP: [name], MD REFERRING PROVIDER: [name], MD  END OF SESSION:  OT End of Session - 03/26/24 0809     Visit Number 4    Number of Visits 7    Date for OT Re-Evaluation 04/12/24    Authorization Type [employer] [id number]    OT Start Time 0805    OT Stop Time 0845    OT Time Calculation (min) 40 min    Activity Tolerance Patient tolerated treatment well    Behavior During Therapy WFL for tasks assessed/performed                Past Medical History:  Diagnosis Date   Arthritis    Atrial flutter (HCC)    diagnosed 2005   Depression    Diverticular disease    GERD (gastroesophageal reflux disease)    Obesity    Paroxysmal atrial fibrillation (HCC)    Primary localized osteoarthritis of left knee    Past Surgical History:  Procedure Laterality Date   ATRIAL FIBRILLATION ABLATION N/A 01/24/2019   Procedure: ATRIAL FIBRILLATION ABLATION;  Surgeon: Hillis Range, MD;  Location: [facility];  Service: Cardiovascular;  Laterality: N/A;   ATRIAL FIBRILLATION ABLATION N/A 04/14/2020   Procedure: ATRIAL FIBRILLATION ABLATION;  Surgeon: Hillis Range, MD;  Location: [facility];  Service: Cardiovascular;  Laterality: N/A;   CARDIOVERSION N/A 12/06/2018   Procedure: CARDIOVERSION;  Surgeon: Parke Poisson, MD;  Location: [facility] ENDOSCOPY;  Service: Cardiovascular;  Laterality: N/A;   CARDIOVERSION N/A 03/13/2019   Procedure: CARDIOVERSION;  Surgeon: Wendall Stade, MD;  Location: [facility] ENDOSCOPY;  Service: Cardiovascular;  Laterality: N/A;   CARDIOVERSION N/A 03/05/2020   Procedure: CARDIOVERSION;  Surgeon: Sande Rives, MD;  Location: [facility] ENDOSCOPY;  Service: Cardiovascular;  Laterality: N/A;   COLON SURGERY  12/07, 5/08, 10/08   COLONOSCOPY     COLOSTOMY  2007   COLOSTOMY REVERSAL     EYE SURGERY  Bilateral    cataracts   INCISIONAL HERNIA REPAIR  2008   with mesh   KNEE ARTHROSCOPY Left 5/10   x2   LEFT HEART CATH AND CORONARY ANGIOGRAPHY N/A 10/01/2021   Procedure: LEFT HEART CATH AND CORONARY ANGIOGRAPHY;  Surgeon: Lyn Records, MD;  Location: [facility];  Service: Cardiovascular;  Laterality: N/A;   MULTIPLE TOOTH EXTRACTIONS     neck fusion  5/11   REPLANTATION THUMB Right    TONSILLECTOMY     TOTAL KNEE ARTHROPLASTY Left 08/23/2017   TOTAL KNEE ARTHROPLASTY Left 08/23/2017   Procedure: LEFT TOTAL KNEE ARTHROPLASTY;  Surgeon: Salvatore Marvel, MD;  Location: [facility] OR;  Service: Orthopedics;  Laterality: Left;   WISDOM TOOTH EXTRACTION     Patient Active Problem List   Diagnosis Date Noted   Stroke (cerebrum) (HCC) 03/01/2024   Hypercoagulable state due to longstanding persistent atrial fibrillation (HCC) 01/24/2024   Symptoms of upper respiratory infection (URI) 02/13/2022   Essential hypertension 02/13/2022   Chest pain of uncertain etiology    CAD in native artery    DOE (dyspnea on exertion)    Atypical atrial flutter (HCC) 05/12/2020   Atrial fibrillation with RVR (HCC) 12/05/2018   Atrial fibrillation (HCC) 08/08/2017   Diverticular disease    Primary localized osteoarthritis of left knee    Spinal stenosis 11/19/2014   Diarrhea 07/26/2012    ONSET  DATE: 03/01/24  REFERRING DIAG: U04.54 (ICD-10-CM) - Personal history of transient ischemic attack (TIA), and cerebral infarction without residual deficits  THERAPY DIAG:  Hemiplegia and hemiparesis following cerebral infarction affecting right dominant side (HCC)  Muscle weakness (generalized)  Other lack of coordination  Ataxia  Rationale for Evaluation and Treatment: Rehabilitation  SUBJECTIVE:   SUBJECTIVE STATEMENT: Pt reports "I'm very pleased".  Pt reports he is writing more and is pleased with how it is coming along.  Pt reports improvements with coordination with use of salt shakers.    "I  would like to work on strengthening".  Pt accompanied by: self  PERTINENT HISTORY: 63 yo male presented to [facility] with dizziness and R sided headache on 03/01/24. MRI showing: Acute Right SCA cerebellar infarct with petechial hemorrhage no significant mass effect, TNK administered on 3/7. Pt returned to ED on 3/14 with similar symptoms as above.  MRI Brain report states small new acute infarcts in R cerebellum. However, on stroke team review, mostly evolution of the recent stroke, do not see definite clearly new strokes. Per PCP note, pt still finds some mild difficulty with mobility of right arm, but no weakness on examination today. It is felt that stroke was triggered by recurrent A-fib. Pt now back on Eliquis.  PMH: hx of R cerebellar stroke (discharged from [facility]  3/8), Aflutter/Afib (just started on eliquis), Depression, Arthritis, GERD, HTN, and chronic pain  PRECAUTIONS: None  WEIGHT BEARING RESTRICTIONS: No  PAIN:  Are you having pain? Yes: NPRS scale: 3-5/10 Pain location: back Pain description: chronic pain due to multiple spinal cord/back surgeries and spinal stenosis Aggravating factors: crawling and moving heavy objects - work related tasks Relieving factors: reports mornings are his best time in regards to pain  FALLS: Has patient fallen in last 6 months? 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  HAND DOMINANCE: Right  ADLs: Overall ADLs: Independent with bathing and dressing tasks Transfers/ambulation related to ADLs: Mod I - Independent with no AD Eating: "hesitancy" and decreased control with scooping foods Grooming: reports brushing teeth is "different" Equipment: Shower seat without back and Grab bars  IADLs: Meal Prep: "hesitancy" and decreased motor control when picking up tongs and flipping food, shaking on seasoning, asking for assistance with cutting cantaloupe  Community mobility: driving Medication management: Mod I Handwriting:  "I wrist real small now".  Pt reports handwriting 2/10 on legibility compared to prior to stroke.  PPT #1: (whales live in a blue ocean): 16.19 sec  MOBILITY STATUS: Independent  POSTURE COMMENTS:  No Significant postural limitations  ACTIVITY TOLERANCE: Activity tolerance: WFL for tasks assessed on eval  FUNCTIONAL OUTCOME MEASURES: PSFS 3.7   03/26/24   UPPER EXTREMITY ROM:    Active ROM Right eval Left eval  Shoulder flexion WFL - mild decreased motor control with shoulder movements WFL  Shoulder abduction [facility]  Shoulder adduction  Shoulder extension    Shoulder internal rotation [facility]  Shoulder external rotation [facility] WFL  Elbow flexion [facility] WFL  Elbow extension [facility] WFL  Wrist flexion    Wrist extension    Wrist ulnar deviation    Wrist radial deviation    Wrist pronation    Wrist supination    (Blank rows = not tested)  UPPER EXTREMITY MMT:     MMT Right eval Left eval  Shoulder flexion 4- 5  Shoulder abduction    Shoulder adduction    Shoulder extension    Shoulder internal rotation    Shoulder external rotation    Middle trapezius    Lower trapezius    Elbow flexion 4+ 5  Elbow extension 4+ 5  Wrist flexion    Wrist extension    Wrist ulnar deviation    Wrist radial deviation    Wrist pronation    Wrist supination    (Blank rows = not tested)  HAND  FUNCTION: Grip strength: Right: 87 lbs; Left: 92 lbs, Lateral pinch: Right: 27 lbs, Left: 23 lbs, and 3 point pinch: Right: 20 lbs, Left: 20 lbs  COORDINATION: Finger Nose Finger test: Mild dysmetria on R and slower compared to L 9 Hole Peg test: Right: 37.13 sec; Left: 29.28 sec Box and Blocks:  Right 47 blocks, Left 57 blocks  SENSATION: WFL  COGNITION: Overall cognitive status: Within functional limits for tasks assessed  VISION: Subjective report: "blurriness" reports wearing reading glasses more than before; has scar on L eye due to a stick going through his eye Baseline vision: Wears glasses for reading only and h/o cataract removal Visual history: cataracts removal  VISION ASSESSMENT: Not tested  OBSERVATIONS: Impaired motor control of RUE noted with shoulder ROM, most specifically with shoulder flexion.  Pt demonstrating "clumsy" movements with coordination assessment and dysmetria with finger to nose assessment on R.                                                                                                                           TREATMENT DATE:  03/26/24 Self-care: educating on increased strengthening and insurance benefits (Silver Sneakers) to allow for affordable gym memberships.  Pt reports deciding between a few local gyms to assess which one may be more appropriate for his needs.   9 hole peg test: R: 32.82 Box and blocks: R: 55 Handwriting: pt requiring increased time to write simple sentence, stating his "W" is giving him a hard time, completing in 17.09 sec.  However pt reporting improvements in legibility up to 6.5-7/10.   PSFS - pt reporting improvements in all areas, but recognizes still having room to improve. Energy conservation: educated on 4P's of energy conservation (planning, prioritizing, pacing, and positioning) and providing cues and examples for each.      03/21/24 NMR:  WB standing at counter top - with focus on improved motor control.   Initially completing modified pushups at counter top progressing to alternating  shoulder taps and then progressing to tapping target on vertical surface for further precision control.  OT educating on rationale of WB through effected RUE as well as tapping with RUE for motor control.   clock tapping at wall - with alternating UE to facilitate WB as well as motor control.  Pt continues to demonstrate mild ataxia, especially at distal range/when approaching target., tapping dot and then nose. Tapping shapes on cards - with focus on gross motor control with precision tapping, alternating between tapping shapes and nose to challenge motor control.   Sliding coin - from target to target to facilitate increased motor control.  Educated on sliding checker pieces from space to space and/or coins from target to target. Stacking [place] - with focus on motor control, initially stacking in vertical position with full arm extension, progressing to stacking in 3 piece bridge form (side, side, top) with progressively higher reaching. Trail making - engaged in precision sliding with dragging finger from dot to dot in sequential order to challenge motor control with direction changes.    03/19/24 Coordination: engaged in rotating 2 golf balls in R hand clockwise and counterclockwise.  Pt demonstrating more difficulty with rotating counterclockwise.  Downgraded to completing with "Chinese balls" (smaller, smoother balls) with improved control, still min difficulty when rotating counter clockwise.  Pt able to progress to attempting task without visually attending to task with min challenge.   GMC: engaged in placing golf balls onto cones with tongs to challenge motor control and functional reach and grasp.  Pt demonstrating min ataxia when reaching towards cones both when placing and when removing balls.  Transitioned to placing and removing rings from vertical stack of cones with challenge to not touch the side of the cones.   Pt completing with good effort, touching sides of cones ~30% of time. Handwriting: OT educated on various pre-writing tasks to allow for increased hand movements to facilitate improved handwriting.  Pt completing word search puzzle to allow for increased UE movement and hand gliding over paper to carry over to handwriting.  Engaged in flipping coin in between thumb and first 3 fingers to challenge coordination as needed for improved control and coordination of pen with writing. OT reiterated pen tricks for coordination as needed for handwriting.     PATIENT EDUCATION: Education details: Educated on role and purpose of OT as well as potential interventions and goals for therapy based on initial evaluation findings. Person educated: Patient Education method: Explanation and Handouts Education comprehension: verbalized understanding and needs further education  HOME EXERCISE PROGRAM: 03/14/24 - coordination HEP (see pt instructions)  03/21/24 - GMC tasks (see pt instructions)  Access Code: [email or web address] URL: [email or web address] Date: 03/21/2024 Prepared by: [facility] - Outpatient  Rehab - [facility]  Exercises - Seated Shoulder Blade Squeeze  - 2 sets - 10 reps - Wall Push Up  - 2 sets - 10 reps - Full Plank on Counter, Opposite Shoulder Taps  - 2 sets - 10 reps   GOALS: Goals reviewed with patient? Yes   LONG TERM GOALS: Target date: 04/12/24  Pt will be independent with coordination and strengthening HEP with use of handouts. Baseline: new to OP OT Goal status:  MET  2.  Pt will demonstrated improvement in speed and legibility with handwriting as evidenced by improvement of legibility to 6/10 and writing simple sentence in <12 seconds. Baseline: PPT #1: 16.19 sec and pt reporting legibility 2/10 Goal status:  Partially met - pt reports 6.5-7/10 legibility, but  PPT #1: 17.09 sec on 03/26/24  3.  Pt will demonstrate improved motor control of RUE to engage in  toothbrushing and shaking seasoning with cooking as evidenced by improved score on Box and Blocks assessment by 6 blocks with RUE. Baseline: R: 47 and L: 57 blocks Goal status:  MET - 55 blocks on 03/26/24  4.  Pt will demonstrate improved FMC as needed for ADLs and IADLs as evidenced by improved score on 9 hole peg test by 5 seconds with RUE. Baseline: Right: 37.13 sec; Left: 29.28 sec Goal status: MET - 32 sec on 03/26/24  5.  Patient will report at least two-point increase in average PSFS score or at least three-point increase in a single activity score indicating functionally significant improvement given minimum detectable change. Baseline: 3.7 score Goal status: MET - PSFS 7 on 03/26/24  ASSESSMENT:  CLINICAL IMPRESSION: Patient reports pleased with current status and recognizing improvements overall.  Pt demonstrating improved coordination and motor control with 9 hole peg test and Box and blocks assessment. Pt also with improved legibility with handwriting, however still requiring increased time and effort for legibility.  Pt reports desire to work on increased strengthening and questioning whether he could do that at a gym or if needing additional therapeutic intervention.  Pt to take next few days to assess if desire to continue with therapy or to transition to strengthening at gym.   Pt encouraged to call back before next scheduled appt with any changes or if any relevant functional deficits develop/occur. Pt to be discharged from OP OT if no further therapy is warranted by 04/14/24.   PERFORMANCE DEFICITS: in functional skills including ADLs, IADLs, coordination, proprioception, ROM, strength, pain, Fine motor control, Gross motor control, body mechanics, decreased knowledge of precautions, decreased knowledge of use of DME, vision, and UE functional use and psychosocial skills including environmental adaptation and routines and behaviors.     PLAN:  OT FREQUENCY: 2x/week  OT DURATION:  4 weeks  PLANNED INTERVENTIONS: 97168 OT Re-evaluation, 97535 self care/ADL training, 78469 therapeutic exercise, 97530 therapeutic activity, 97112 neuromuscular re-education, 97140 manual therapy, 97035 ultrasound, 97010 moist heat, 97010 cryotherapy, 97032 electrical stimulation (manual), compression bandaging, visual/perceptual remediation/compensation, psychosocial skills training, energy conservation, coping strategies training, patient/family education, and DME and/or AE instructions  RECOMMENDED OTHER SERVICES: NA  CONSULTED AND AGREED WITH PLAN OF CARE: Patient  PLAN FOR NEXT SESSION: engage in WB against wall vs counter/elevated mat table vs quadruped (does have bad back), education on strategies to improve gross motor control/ataxia - continue to progress to various planes and placing pegs in vertical position and pouring items.  Increased strengthening and motor control   Ronique Simerly, OTR/L 03/26/2024, 8:39 AM   [facility] at [facility] [address] [place], [place] [address] Phone # [phone] Fax # [phone]   OCCUPATIONAL THERAPY DISCHARGE SUMMARY  Visits from Start of Care: 4  Current functional level related to goals / functional outcomes: Pt has met 4 of 5 LTGs and partially met remaining goal with improved legibility - however with slowed pace.  Pt has demonstrated great progress with fine and gross motor control and is intuitive to apply concepts to home and work activities to increase motor control, ease, and independence.  At above visit, discussed pleased with progress and agreeable to plan to hold for a few days to assess carryover of education/exercises prior to d/c. Pt called clinic and informed pleased with progress and desire to  d/c from OT at this time.   Remaining deficits: Mild motor control deficits   Education / Equipment: Coordination and strengthening HEP, education on motor control, handwriting  strategies, and energy conservation   Patient agrees to discharge. Patient goals were met. Patient is being discharged due to being pleased with the current functional level.  Rosalio Loud, OT 04/04/24

## 2024-03-28 ENCOUNTER — Ambulatory Visit: Admitting: Occupational Therapy

## 2024-04-02 ENCOUNTER — Ambulatory Visit: Admitting: Occupational Therapy

## 2024-04-04 ENCOUNTER — Ambulatory Visit: Admitting: Occupational Therapy

## 2024-04-08 ENCOUNTER — Encounter: Admitting: Occupational Therapy

## 2024-04-10 ENCOUNTER — Encounter: Admitting: Occupational Therapy

## 2024-04-26 ENCOUNTER — Ambulatory Visit (INDEPENDENT_AMBULATORY_CARE_PROVIDER_SITE_OTHER)

## 2024-04-26 DIAGNOSIS — Z8673 Personal history of transient ischemic attack (TIA), and cerebral infarction without residual deficits: Secondary | ICD-10-CM

## 2024-04-29 LAB — CUP PACEART REMOTE DEVICE CHECK
Date Time Interrogation Session: 20250502225734
Implantable Pulse Generator Implant Date: 20221201

## 2024-05-09 ENCOUNTER — Encounter: Payer: Self-pay | Admitting: Neurology

## 2024-05-09 ENCOUNTER — Ambulatory Visit: Admitting: Neurology

## 2024-05-09 VITALS — BP 124/78 | HR 50 | Ht 69.0 in | Wt 235.5 lb

## 2024-05-09 DIAGNOSIS — I482 Chronic atrial fibrillation, unspecified: Secondary | ICD-10-CM | POA: Diagnosis not present

## 2024-05-09 DIAGNOSIS — I639 Cerebral infarction, unspecified: Secondary | ICD-10-CM | POA: Diagnosis not present

## 2024-05-09 NOTE — Patient Instructions (Signed)
 I had a long d/w patient about his recent embolic stroke, atrial fibrillation,risk for recurrent stroke/TIAs, personally independently reviewed imaging studies and stroke evaluation results and answered questions.Continue Eliquis  (apixaban ) 5 mg twice daily  for secondary stroke prevention and maintain strict control of hypertension with blood pressure goal below 130/90, diabetes with hemoglobin A1c goal below 6.5% and lipids with LDL cholesterol goal below 70 mg/dL. I also advised the patient to eat a healthy diet with plenty of whole grains, cereals, fruits and vegetables, exercise regularly and maintain ideal body weight Followup in the future with my nurse practitioner in 6 months or call earlier if needed.  Stroke Prevention Some medical conditions and behaviors can lead to a higher chance of having a stroke. You can help prevent a stroke by eating healthy, exercising, not smoking, and managing any medical conditions you have. Stroke is a leading cause of functional impairment. Primary prevention is particularly important because a majority of strokes are first-time events. Stroke changes the lives of not only those who experience a stroke but also their family and other caregivers. How can this condition affect me? A stroke is a medical emergency and should be treated right away. A stroke can lead to brain damage and can sometimes be life-threatening. If a person gets medical treatment right away, there is a better chance of surviving and recovering from a stroke. What can increase my risk? The following medical conditions may increase your risk of a stroke: Cardiovascular disease. High blood pressure (hypertension). Diabetes. High cholesterol. Sickle cell disease. Blood clotting disorders (hypercoagulable state). Obesity. Sleep disorders (obstructive sleep apnea). Other risk factors include: Being older than age 54. Having a history of blood clots, stroke, or mini-stroke (transient ischemic  attack, TIA). Genetic factors, such as race, ethnicity, or a family history of stroke. Smoking cigarettes or using other tobacco products. Taking birth control pills, especially if you also use tobacco. Heavy use of alcohol or drugs, especially cocaine and methamphetamine. Physical inactivity. What actions can I take to prevent this? Manage your health conditions High cholesterol levels. Eating a healthy diet is important for preventing high cholesterol. If cholesterol cannot be managed through diet alone, you may need to take medicines. Take any prescribed medicines to control your cholesterol as told by your health care provider. Hypertension. To reduce your risk of stroke, try to keep your blood pressure below 130/80. Eating a healthy diet and exercising regularly are important for controlling blood pressure. If these steps are not enough to manage your blood pressure, you may need to take medicines. Take any prescribed medicines to control hypertension as told by your health care provider. Ask your health care provider if you should monitor your blood pressure at home. Have your blood pressure checked every year, even if your blood pressure is normal. Blood pressure increases with age and some medical conditions. Diabetes. Eating a healthy diet and exercising regularly are important parts of managing your blood sugar (glucose). If your blood sugar cannot be managed through diet and exercise, you may need to take medicines. Take any prescribed medicines to control your diabetes as told by your health care provider. Get evaluated for obstructive sleep apnea. Talk to your health care provider about getting a sleep evaluation if you snore a lot or have excessive sleepiness. Make sure that any other medical conditions you have, such as atrial fibrillation or atherosclerosis, are managed. Nutrition Follow instructions from your health care provider about what to eat or drink to help manage your  health condition. These instructions may include: Reducing your daily calorie intake. Limiting how much salt (sodium) you use to 1,500 milligrams (mg) each day. Using only healthy fats for cooking, such as olive oil, canola oil, or sunflower oil. Eating healthy foods. You can do this by: Choosing foods that are high in fiber, such as whole grains, and fresh fruits and vegetables. Eating at least 5 servings of fruits and vegetables a day. Try to fill one-half of your plate with fruits and vegetables at each meal. Choosing lean protein foods, such as lean cuts of meat, poultry without skin, fish, tofu, beans, and nuts. Eating low-fat dairy products. Avoiding foods that are high in sodium. This can help lower blood pressure. Avoiding foods that have saturated fat, trans fat, and cholesterol. This can help prevent high cholesterol. Avoiding processed and prepared foods. Counting your daily carbohydrate intake.  Lifestyle If you drink alcohol: Limit how much you have to: 0-1 drink a day for women who are not pregnant. 0-2 drinks a day for men. Know how much alcohol is in your drink. In the [place] one drink equals one 12 oz bottle of beer ( ), one 5 oz glass of wine ( ), or one 1 oz glass of hard liquor (44mL). Do not use any products that contain nicotine or tobacco. These products include cigarettes, chewing tobacco, and vaping devices, such as e-cigarettes. If you need help quitting, ask your health care provider. Avoid secondhand smoke. Do not use drugs. Activity  Try to stay at a healthy weight. Get at least 30 minutes of exercise on most days, such as: Fast walking. Biking. Swimming. Medicines Take over-the-counter and prescription medicines only as told by your health care provider. Aspirin  or blood thinners (antiplatelets or anticoagulants) may be recommended to reduce your risk of forming blood clots that can lead to stroke. Avoid taking birth control pills. Talk to your  health care provider about the risks of taking birth control pills if: You are over 17 years old. You smoke. You get very bad headaches. You have had a blood clot. Where to find more information American Stroke Association: www.strokeassociation.org Get help right away if: You or a loved one has any symptoms of a stroke. "BE FAST" is an easy way to remember the main warning signs of a stroke: B - Balance. Signs are dizziness, sudden trouble walking, or loss of balance. E - Eyes. Signs are trouble seeing or a sudden change in vision. F - Face. Signs are sudden weakness or numbness of the face, or the face or eyelid drooping on one side. A - Arms. Signs are weakness or numbness in an arm. This happens suddenly and usually on one side of the body. S - Speech. Signs are sudden trouble speaking, slurred speech, or trouble understanding what people say. T - Time. Time to call emergency services. Write down what time symptoms started. You or a loved one has other signs of a stroke, such as: A sudden, severe headache with no known cause. Nausea or vomiting. Seizure. These symptoms may represent a serious problem that is an emergency. Do not wait to see if the symptoms will go away. Get medical help right away. Call your local emergency services (911 in the [place] Do not drive yourself to the hospital. Summary You can help to prevent a stroke by eating healthy, exercising, not smoking, limiting alcohol intake, and managing any medical conditions you may have. Do not use any products that contain nicotine or tobacco. These include cigarettes,  chewing tobacco, and vaping devices, such as e-cigarettes. If you need help quitting, ask your health care provider. Remember "BE FAST" for warning signs of a stroke. Get help right away if you or a loved one has any of these signs. This information is not intended to replace advice given to you by your health care provider. Make sure you discuss any questions you  have with your health care provider. Document Revised: 11/14/2022 Document Reviewed: 11/14/2022 Elsevier Patient Education  2024 [employer].

## 2024-05-09 NOTE — Progress Notes (Signed)
 [facility] [address] [place]. [place] [address] [phone]       OFFICE FOLLOW-UP NOTE  [name] Date of Birth:  [birth year] Medical Record Number:  [id number]   HPI: [name] is a 63 year old Caucasian male seen today for initial office visit following hospital consultation for stroke in March 2025.  History is obtained from the patient and review of electronic medical records.  I personally reviewed pertinent available imaging films in PACS.  He has past medical history of atrial flutter/fibrillation, obesity, gastroesophageal reflux disease, depression and diverticular disease.  He presented on 03/01/2024 with sudden onset of dry heaves, dizziness, headache, slurred speech and balance issues.  He was unable to walk and required 2 people to get him up because of imbalance.  NIH stroke scale on admission was 3 due to limb ataxia and sensory impairment.  Code stroke CT was unremarkable.  CT angiogram showed occlusion of the right vertebral artery at its origin with reconstitution in the V3 and V4 segments.  Right PICA was patent.  MRI scan showed right superior cerebellar infarct with petechial hemorrhage.  2D echo showed ejection fraction of 60 to 65%.  LDL cholesterol 67 mg percent.  Hemoglobin A1c was 5.4.  Patient had not been on any antithrombotics prior to admission and was started on Eliquis .  He was discharged home without any residual deficits but returned on 03/08/2024 with sudden onset of dry heaves, dizziness, headache and incoordination.  MRI scan showed redemonstration of diffusion signal abnormality in the right cerebellum corresponding to the recent infarct with diffuse scattered foci of additional infarction not seen on the previous scan which were felt to be natural evolution rather than a new stroke.  Repeat CT angiogram of the brain and neck again showed no significant changes compared to the previous study from a week ago.  Patient was continued on  Eliquis  and states he has done well.  Still has a little bit of incoordination in his right hand back to baseline but balance and walking are fine.  He is tolerating Eliquis  well with minor bruising and no bleeding.  He is eating healthy and has lost 24 pounds since his stroke.  He sleeps in a lift chair due to chronic back injuries and states he does not snore and he does not want to be evaluated for sleep apnea.  He has no new complaints  ROS:   14 system review of systems is positive for dizziness, nausea, hives, slurred speech, and incoordination all other systems negative  PMH:  Past Medical History:  Diagnosis Date   Arthritis    Atrial flutter (HCC)    diagnosed 2005   Depression    Diverticular disease    GERD (gastroesophageal reflux disease)    Obesity    Paroxysmal atrial fibrillation (HCC)    Primary localized osteoarthritis of left knee    Stroke (HCC) 03/01/2024    Social History:  Social History   Socioeconomic History   Marital status: Married    Spouse name: Not on file   Number of children: Not on file   Years of education: Not on file   Highest education level: Not on file  Occupational History   Not on file  Tobacco Use   Smoking status: Former    Current packs/day: 0.00    Types: Cigarettes    Start date: 08/11/2010    Quit date: 08/11/2012    Years since quitting: 11.7   Smokeless tobacco: Never  Tobacco comments:    Former smoker 09/26/22  Vaping Use   Vaping status: Never Used  Substance and Sexual Activity   Alcohol use: Yes    Alcohol/week: 2.0 standard drinks of alcohol    Types: 2 Cans of beer per week    Comment: on occassion   Drug use: No   Sexual activity: Not on file    Comment: married ([name])  Other Topics Concern   Not on file  Social History Narrative   Not on file   Social Drivers of Health   Financial Resource Strain: Low Risk  (11/26/2021)   Received from [facility], [facility]   Overall Financial Resource  Strain (CARDIA)    Difficulty of Paying Living Expenses: Not very hard  Food Insecurity: No Food Insecurity (03/01/2024)   Hunger Vital Sign    Worried About Running Out of Food in the Last Year: Never true    Ran Out of Food in the Last Year: Never true  Transportation Needs: No Transportation Needs (03/01/2024)   PRAPARE - Administrator, Civil Service (Medical): No    Lack of Transportation (Non-Medical): No  Physical Activity: Not on file  Stress: Not on file  Social Connections: Not on file  Intimate Partner Violence: Not At Risk (03/01/2024)   Humiliation, Afraid, Rape, and Kick questionnaire    Fear of Current or Ex-Partner: No    Emotionally Abused: No    Physically Abused: No    Sexually Abused: No    Medications:   Current Outpatient Medications on File Prior to Visit  Medication Sig Dispense Refill   acetaminophen  (TYLENOL ) 650 MG CR tablet Take 1,300 mg by mouth 2 (two) times daily.     apixaban  (ELIQUIS ) 5 MG TABS tablet Take 1 tablet (5 mg total) by mouth 2 (two) times daily. 60 tablet 3   atenolol  (TENORMIN ) 50 MG tablet Take 1 tablet (50 mg total) by mouth 2 (two) times daily. 90 tablet 3   diclofenac  (VOLTAREN ) 50 MG EC tablet Take 50 mg by mouth 2 (two) times daily.     Docusate Sodium  (COLACE PO) Take 1 tablet by mouth daily.     methocarbamol  (ROBAXIN ) 500 MG tablet Take 500 mg by mouth every 8 (eight) hours as needed for muscle spasms.      Multiple Vitamins-Minerals (MULTIVITAMIN WITH MINERALS) tablet Take 1 tablet by mouth daily.     naloxone  (NARCAN ) nasal spray 4 mg/0.1 mL Place 1 spray into the nose once.     oxyCODONE -acetaminophen  (PERCOCET/ROXICET) 5-325 MG tablet Take 1 tablet by mouth 2 (two) times daily.     sildenafil (REVATIO) 20 MG tablet Take 20 mg by mouth daily as needed (ED).     traMADol  (ULTRAM ) 50 MG tablet Take 100 mg by mouth 3 (three) times daily.     No current facility-administered medications on file prior to visit.     Allergies:   Allergies  Allergen Reactions   Mushroom Swelling and Other (See Comments)    Causes tongue swelling that cuts of airway   Erythromycin Other (See Comments)    Abdominal cramps    Hydrocodone -Acetaminophen  Other (See Comments)    Makes him feel "high"   Mobic [Meloxicam] Other (See Comments)    Lower back and kidney pain    Prednisone Hives, Anxiety and Other (See Comments)    Makes him anxious   Z-Pak [Azithromycin] Nausea Only and Other (See Comments)    Severe stomach cramps  Cefuroxime     worsening back pain?   Celecoxib     myalgias   Duloxetine Hcl     spacy   Lopressor  [Metoprolol  Tartrate] Swelling   Metaxalone     Unknown   Amoxicillin Palpitations   Dilaudid  [Hydromorphone ] Palpitations    Pt stated he is not allergic to this medication 03/25   Penicillins Rash and Other (See Comments)    Has patient had a PCN reaction causing immediate rash, facial/tongue/throat swelling, SOB or lightheadedness with hypotension: No Has patient had a PCN reaction causing severe rash involving mucus membranes or skin necrosis: No Has patient had a PCN reaction that required hospitalization: No Has patient had a PCN reaction occurring within the last 10 years: No If all of the above answers are "NO", then may proceed with Cephalosporin use.      Physical Exam General: obese middle aged caucasian male, seated, in no evident distress Head: head normocephalic and atraumatic.  Neck: supple with no carotid or supraclavicular bruits Cardiovascular: regular rate and rhythm, no murmurs Musculoskeletal: no deformity Skin:  no rash/petichiae Vascular:  Normal pulses all extremities Vitals:   05/09/24 0849  BP: 124/78  Pulse: (!) 50   Neurologic Exam Mental Status: Awake and fully alert. Oriented to place and time. Recent and remote memory intact. Attention span, concentration and fund of knowledge appropriate. Mood and affect appropriate.  Cranial Nerves:  Fundoscopic exam reveals sharp disc margins. Pupils equal, briskly reactive to light. Extraocular movements full without nystagmus. Visual fields full to confrontation. Hearing intact. Facial sensation intact. Face, tongue, palate moves normally and symmetrically.  Motor: Normal bulk and tone. Normal strength in all tested extremity muscles. Sensory.: intact to touch ,pinprick .position and vibratory sensation.  Coordination: Rapid alternating movements normal in all extremities. Finger-to-nose and heel-to-shin performed accurately bilaterally. Gait and Station: Arises from chair without difficulty. Stance is normal. Gait demonstrates normal stride length and balance . Able to heel, toe and tandem walk with slight difficulty.  Reflexes: 1+ and symmetric. Toes downgoing.   NIHSS  0 Modified Rankin  1   ASSESSMENT: 63 year old patient with embolic right cerebellar infarct in March 2025 from atrial fibrillation was not on anticoagulation.  Vascular risk factors of atrial fibrillation and mild obesity and at risk for sleep apnea.     PLAN:I had a long d/w patient about his recent embolic stroke, atrial fibrillation,risk for recurrent stroke/TIAs, personally independently reviewed imaging studies and stroke evaluation results and answered questions.Continue Eliquis  (apixaban ) 5 mg twice daily  for secondary stroke prevention and maintain strict control of hypertension with blood pressure goal below 130/90, diabetes with hemoglobin A1c goal below 6.5% and lipids with LDL cholesterol goal below 70 mg/dL. I also advised the patient to eat a healthy diet with plenty of whole grains, cereals, fruits and vegetables, exercise regularly and maintain ideal body weight Followup in the future with my nurse practitioner in 6 months or call earlier if needed. Greater than 50% of time during this 35 minute visit was spent on counseling,explanation of diagnosis of cerebellar  stroke and atrial fibrillation, planning of  further management, discussion with patient and family and coordination of care Ardella Beaver, MD Note: This document was prepared with digital dictation and possible smart phrase technology. Any transcriptional errors that result from this process are unintentional

## 2024-05-28 LAB — CUP PACEART REMOTE DEVICE CHECK
Date Time Interrogation Session: 20250602225719
Implantable Pulse Generator Implant Date: 20221201

## 2024-05-31 ENCOUNTER — Ambulatory Visit (INDEPENDENT_AMBULATORY_CARE_PROVIDER_SITE_OTHER)

## 2024-05-31 DIAGNOSIS — Z8673 Personal history of transient ischemic attack (TIA), and cerebral infarction without residual deficits: Secondary | ICD-10-CM

## 2024-06-03 ENCOUNTER — Ambulatory Visit: Payer: Self-pay | Admitting: Cardiology

## 2024-06-06 NOTE — Progress Notes (Signed)
 [facility] Summary Report / Loop Recorder

## 2024-06-06 NOTE — Addendum Note (Signed)
 Addended by: Lott Rouleau A on: 06/06/2024 08:30 AM   Modules accepted: Orders

## 2024-06-10 NOTE — Progress Notes (Addendum)
 Cardiology Office Note:  .   Date:  06/12/2024 ID:  [name], DOB 1961-12-12, MRN [id number] PCP: Teresa Channel, MD [facility] Providers Cardiologist:  None   Patient Profile: .      PMH Coronary artery disease CAC Score 1083 (97th percentile) on CT 03/2020 LHC 10/01/2021 Diffuse 3 vessel atherosclerosis and calcification without focal obstructive disease Right cerebellar stroke 03/01/2024 Hypertension Atrial fibrillation  S/p Convergent surgery at [facility] 2 prior ablations Return of PAF on ILR  Mitral regurgitation Mild MR on TTE 03/01/2024  Followed by EP for history of persistent A-fib and flutter, s/p ablation x 2 and convergent surgery on 11/2021 at [facility].  He had 2 previous ablations by Dr. Kelsie.  He remained episode free for approximately a year and a half post convergent surgery leading to the discontinuation of anticoagulation.  However he recently experienced a stroke with uncertainty regarding a second event prompting the resumption of Eliquis .  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  He had a loop recorder implanted that was not monitored for approximately 1 year.  Last device download remotely in April 2024.  Reports he stopped monitoring due to high cost and perceived lack of necessity after being taken off Eliquis .  Last clinic visit 03/15/2024 with Dr. Kennyth for consideration of watchman implant.  He reported palpitations.  He had a right cerebellar stroke 03/01/2024 and currently undergoing rehabilitation for his hand which is the only residual issue following stroke. He reported he was not interested in [place] device at this time.        History of Present Illness: .    Discussed the use of AI scribe software for clinical note transcription with the patient, who gave verbal consent to proceed.  History of Present Illness [name] is a very pleasant 63 year old male who is  here today for follow-up of coronary artery disease.  Reports he has wondered about his heart health since stroke in March 2025.  Stroke was felt to likely be secondary to A-fib. Long history of atrial fibrillation with multiple ablations. He underwent cardiac cath 09/2021 which revealed nonobstructive CAD. He reports symptoms of heat sensation with atrial fibrillation at rest with elevated heart rate up to 152 bpm. He uses cold compresses, cold fluids, and the Valsalva maneuver for management, occasionally resorting to prn amiodarone  that was prescribed by Dr Kelsie many years ago. He rarely has episodes of a fib with physical activity. 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 His typical routine is to get up at 5 am, take medications, do household chores, and exercise prior to going to work. He exercises 5-6 days per week, sometimes at home and other times at [facility] which also involves aquatic exercise.  He is bothered by significant back, hip and leg pain that is severe by the end of the day.  He occasionally has discomfort between his shoulder blades. He has wondered in the past if it was heart related. He has significant family history of his father who underwent bypass surgery at a young age.  His mother died following a fall which led to a stroke.  He denies shortness of breath, orthopnea, PND, edema, presyncope, syncope.  Reports his cholesterol has always been well-controlled. Due to history of diverticulitis, he has followed a high-protein low residue diet for several years including chicken, fish, yogurt, berries, vegetables and rarely eats red meat.  He rarely eats junk food but if he does he prefers pork rinds. He drinks 1 small Coke 0 daily and generally drinks 2 beers per day.   Family history: His family history includes CAD in his father and sister; Cancer in his paternal grandfather; Colon polyps in his father; Diabetes in his father; Hypertension in his father and  mother.  Father had CABG x 2, died 15 yrs later from perforated ulceration in coronary artery Grandparents lived into their 56s Sister - a fib, died from aneurysm   Discussed the use of AI scribe software for clinical note transcription with the patient, who gave verbal consent to proceed.  ASCVD Risk Score: ASCVD (Atherosclerotic Cardiovascular Disease) Risk Algorithm including Known ASCVD from AHA/ACC from [email or web address]  on 06/12/2024 ** All calculations should be rechecked by clinician prior to use **  RESULT SUMMARY: 7.3 % Risk of cardiovascular event (coronary or stroke death or non-fatal MI or stroke) in next 10 years.   INPUTS: History of ASCVD --> 0 = No LDL Cholesterol >=190mg /dL (5.07 mmol/L) --> 0 = No Age --> 62 years Diabetes --> 0 = No Sex --> 1 = Male Total Cholesterol --> 129 mg/dL HDL Cholesterol --> 49 mg/dL Systolic Blood Pressure --> 112 mm Hg Treatment for Hypertension --> 1 = Yes Smoker --> 0 = No Race --> 1 = White  Diet: High protein, loss residue diet for several years Yogurt with fruit, chicken, occasional red meat, lots of beans, fish Rare junk food  (occasional pork rinds) 1-2 Coke Zeros daily (small bottles) 2 beers daily   Activity: Up at 5 am daily, works full Solicitor, weight lifting 6 days/week at [employer] or at home  No results found for: LIPOA    ROS: See HPI       Studies Reviewed: .          Risk Assessment/Calculations:    CHA2DS2-VASc Score = 4   This indicates a 4.8% annual risk of stroke. The patient's score is based upon: CHF History: 0 HTN History: 1 Diabetes History: 0 Stroke History: 2 Vascular Disease History: 1 Age Score: 0 Gender Score: 0            Physical Exam:   VS: BP 112/68   Pulse 64   Ht 5' 9 (1.753 m)   Wt 228 lb (103.4 kg)   SpO2 98%   BMI 33.67 kg/m   Wt Readings from Last 3 Encounters:  06/12/24 228 lb (103.4 kg)  05/09/24 235 lb 8 oz (106.8 kg)  03/15/24 232 lb 3.2 oz (105.3  kg)     GEN: Well nourished, well developed in no acute distress NECK: No JVD; No carotid bruits CARDIAC: RRR, no murmurs, rubs, gallops RESPIRATORY:  Clear to auscultation without rales, wheezing or rhonchi  ABDOMEN: Soft, non-tender, non-distended EXTREMITIES:  No edema; No deformity   Assessment & Plan Coronary Artery Disease Mild to moderate non-obstructive CAD on cath 09/2021.  He remains active with work and regular exercise. He reports back pain that has been present for several years which he attributes to MSK pain, but he now wonders if there are masked symptoms of angina. He is more concerned about his heart since having a stroke earlier this year and due to his father's history of CABG at a young age. He denies shortness of breath. We will get cardiac PET CT for evaluation of ischemia given known mild to moderate CAD and additional risk factors of stroke and hypertension. We recheck  lipids and check LP(a). He is not on aspirin  in the setting of need for [facility]. Focus on secondary prevention including heart healthy mostly plant based diet avoiding saturated fat, processed foods, simple carbohydrates, and sugar along with aiming for at least 150 minutes of moderate intensity exercise each week.  ADDENDUM: Pt had cardiac PET CT 07/11/24 which revealed medium defect with moderate reduction in uptake in apical to mid inferior and lateral locations that is partially reversible. EF is abnormal, there were gating difficulties due to frequent PACs/PVCs. Echo completed 09/01/24 reveals normal heart function. He reports he is asymptomatic with regular workouts, however he does have back pain that could potentially be concerning for angina. With prior moderate CAD noted on cath in 2022 and additional risk factors of prior stroke, hyperlipidemia, and family history he would like to proceed with cath.  Informed Consent   Shared Decision Making/Informed Consent The risks [stroke (1 in 1000), death (1 in  1000), kidney failure [usually temporary] (1 in 500), bleeding (1 in 200), allergic reaction [possibly serious] (1 in 200)], benefits (diagnostic support and management of coronary artery disease) and alternatives of a cardiac catheterization were discussed in detail with Mr. Parrillo and he is willing to proceed.     Persistent Atrial Fibrillation on Chronic Anticoagulation Approximately 3% atrial burden on loop recorder despite 2 previous ablations and convergent surgery at [facility] in 2022.  Symptoms include a flat sensation that typically develops at rest.  He does not have symptoms with activity.  This is managed with Valsalva and cold compresses, fluids. He occasionally takes amiodarone .  HR is well-controlled today.  He is on Eliquis  for stroke prevention for CHA2DS2-VASc score of 4.  No bleeding concerns.  Management per EP cardiology.  Cholesterol Screening LDL goal < 70 No prior history of elevated cholesterol. Most recent lipid panel completed 03/14/2024 with total cholesterol 129, HDL 49, LDL 67, and triglycerides 66.  We will recheck lipids along with LP(a) for further risk stratification. We discussed consideration of low dose statin for plaque stabilization.   Stroke   History of right cerebellar stroke 02/2024.  Reports he was told it was likely secondary to atrial fibrillation. No acute concerns today. Continue OAC for stroke prevention secondary to a fib. We will get updated lipid panel when he can return fasting.   Chronic Back and Neck Pain   He remains active despite chronic neck, back, hip and leg pain. He is concerned that chest pain could be masked by chronic MSK and nerve pain. Pain is overall well controlled. Management per PCP.      Informed Consent   Shared Decision Making/Informed Consent The risks [chest pain, shortness of breath, cardiac arrhythmias, dizziness, blood pressure fluctuations, myocardial infarction, stroke/transient ischemic attack, nausea, vomiting, allergic  reaction, radiation exposure, metallic taste sensation and life-threatening complications (estimated to be 1 in 10,000)], benefits (risk stratification, diagnosing coronary artery disease, treatment guidance) and alternatives of a cardiac PET stress test were discussed in detail with Mr. Mclear and he agrees to proceed.     Dispo: TBD following PET  Signed, Rosaline Bane, NP-C

## 2024-06-11 ENCOUNTER — Telehealth: Payer: Self-pay

## 2024-06-11 NOTE — Patient Outreach (Signed)
 Telephone outreach to patient to obtain mRS was successfully completed. MRS= 1  Vanice Sarah [facility] Health Care Management Assistant  Direct Dial: [phone]  Fax: [phone] Website: [email or web address]

## 2024-06-12 ENCOUNTER — Encounter (HOSPITAL_BASED_OUTPATIENT_CLINIC_OR_DEPARTMENT_OTHER): Payer: Self-pay | Admitting: Nurse Practitioner

## 2024-06-12 ENCOUNTER — Ambulatory Visit (INDEPENDENT_AMBULATORY_CARE_PROVIDER_SITE_OTHER): Admitting: Nurse Practitioner

## 2024-06-12 VITALS — BP 112/68 | HR 64 | Ht 69.0 in | Wt 228.0 lb

## 2024-06-12 DIAGNOSIS — I2510 Atherosclerotic heart disease of native coronary artery without angina pectoris: Secondary | ICD-10-CM | POA: Diagnosis not present

## 2024-06-12 DIAGNOSIS — D6869 Other thrombophilia: Secondary | ICD-10-CM

## 2024-06-12 DIAGNOSIS — R9430 Abnormal result of cardiovascular function study, unspecified: Secondary | ICD-10-CM

## 2024-06-12 DIAGNOSIS — M549 Dorsalgia, unspecified: Secondary | ICD-10-CM

## 2024-06-12 DIAGNOSIS — Z8673 Personal history of transient ischemic attack (TIA), and cerebral infarction without residual deficits: Secondary | ICD-10-CM | POA: Diagnosis not present

## 2024-06-12 DIAGNOSIS — G8929 Other chronic pain: Secondary | ICD-10-CM

## 2024-06-12 DIAGNOSIS — Z13220 Encounter for screening for lipoid disorders: Secondary | ICD-10-CM

## 2024-06-12 DIAGNOSIS — I4819 Other persistent atrial fibrillation: Secondary | ICD-10-CM | POA: Diagnosis not present

## 2024-06-12 NOTE — Patient Instructions (Signed)
 Medication Instructions:   Your physician recommends that you continue on your current medications as directed. Please refer to the Current Medication list given to you today.   *If you need a refill on your cardiac medications before your next appointment, please call your pharmacy*  Lab Work:  Your physician recommends that you return for a FASTING NMR/ LPA, fasting after midnight in the near future.  Patient given paperwork today.    If you have labs (blood work) drawn today and your tests are completely normal, you will receive your results only by: MyChart Message (if you have MyChart) OR A paper copy in the mail If you have any lab test that is abnormal or we need to change your treatment, we will call you to review the results.  Testing/Procedures:     Please report to Radiology at the [facility] Main Entrance 30 minutes early for your test.  [address] [place], [place] [address]  How to Prepare for Your Cardiac PET/CT Stress Test:  Nothing to eat or drink, except water , 3 hours prior to arrival time.  NO caffeine/decaffeinated products, or chocolate 12 hours prior to arrival. (Please note decaffeinated beverages (teas/coffees) still contain caffeine).  If you have caffeine within 12 hours prior, the test will need to be rescheduled.  Medication instructions: Do not take erectile dysfunction medications for 72 hours prior to test (sildenafil, tadalafil)  You may take your remaining medications with water .  NO cologne or lotion on chest or abdomen area.  Total time is 1 to 2 hours; you may want to bring reading material for the waiting time.  In preparation for your appointment, medication and supplies will be purchased.  Appointment availability is limited, so if you need to cancel or reschedule, please call the Radiology Department Scheduler at [phone] 24 hours in advance to avoid a cancellation fee of $100.00  What to Expect When you  Arrive:  Once you arrive and check in for your appointment, you will be taken to a preparation room within the Radiology Department.  A technologist or Nurse will obtain your medical history, verify that you are correctly prepped for the exam, and explain the procedure.  Afterwards, an IV will be started in your arm and electrodes will be placed on your skin for EKG monitoring during the stress portion of the exam. Then you will be escorted to the PET/CT scanner.  There, staff will get you positioned on the scanner and obtain a blood pressure and EKG.  During the exam, you will continue to be connected to the EKG and blood pressure machines.  A small, safe amount of a radioactive tracer will be injected in your IV to obtain a series of pictures of your heart along with an injection of a stress agent.    After your Exam:  It is recommended that you eat a meal and drink a caffeinated beverage to counter act any effects of the stress agent.  Drink plenty of fluids for the remainder of the day and urinate frequently for the first couple of hours after the exam.  Your doctor will inform you of your test results within 7-10 business days.  For more information and frequently asked questions, please visit our website: [email or web address]  For questions about your test or how to prepare for your test, please call: Cardiac Imaging Nurse Navigators Office: [phone]   Follow-Up: At [facility], you and your health needs are our priority.  As part of our  continuing mission to provide you with exceptional heart care, our providers are all part of one team.  This team includes your primary Cardiologist (physician) and Advanced Practice Providers or APPs (Physician Assistants and Nurse Practitioners) who all work together to provide you with the care you need, when you need it.  Your next appointment:    Your follow up  will depend on your future test results.   Provider:    Slater Duncan, NP    We recommend signing up for the patient portal called MyChart.  Sign up information is provided on this After Visit Summary.  MyChart is used to connect with patients for Virtual Visits (Telemedicine).  Patients are able to view lab/test results, encounter notes, upcoming appointments, etc.  Non-urgent messages can be sent to your provider as well.   To learn more about what you can do with MyChart, go to [email or web address].

## 2024-06-18 LAB — NMR, LIPOPROFILE
Cholesterol, Total: 160 mg/dL (ref 100–199)
HDL Particle Number: 32.1 umol/L (ref >=30.5)
HDL-C: 52 mg/dL (ref >39)
LDL Particle Number: 1049 nmol/L — ABNORMAL HIGH (ref <1000)
LDL Size: 20.4 nm — ABNORMAL LOW (ref >20.5)
LDL-C (NIH Calc): 86 mg/dL (ref 0–99)
LP-IR Score: 50 — ABNORMAL HIGH (ref <=45)
Small LDL Particle Number: 513 nmol/L (ref <=527)
Triglycerides: 123 mg/dL (ref 0–149)

## 2024-06-19 ENCOUNTER — Ambulatory Visit: Payer: Self-pay | Admitting: Nurse Practitioner

## 2024-06-19 DIAGNOSIS — Z13220 Encounter for screening for lipoid disorders: Secondary | ICD-10-CM

## 2024-06-19 DIAGNOSIS — I2510 Atherosclerotic heart disease of native coronary artery without angina pectoris: Secondary | ICD-10-CM

## 2024-06-24 ENCOUNTER — Other Ambulatory Visit (HOSPITAL_COMMUNITY): Payer: Self-pay | Admitting: Family Medicine

## 2024-06-25 ENCOUNTER — Encounter (HOSPITAL_BASED_OUTPATIENT_CLINIC_OR_DEPARTMENT_OTHER): Payer: Self-pay

## 2024-06-25 LAB — NMR, LIPOPROFILE
Cholesterol, Total: 153 mg/dL (ref 100–199)
HDL Particle Number: 33 umol/L (ref >=30.5)
HDL-C: 49 mg/dL (ref >39)
LDL Particle Number: 1067 nmol/L — ABNORMAL HIGH (ref <1000)
LDL Size: 20.6 nm (ref >20.5)
LDL-C (NIH Calc): 88 mg/dL (ref 0–99)
LP-IR Score: 46 — ABNORMAL HIGH (ref <=45)
Small LDL Particle Number: 402 nmol/L (ref <=527)
Triglycerides: 85 mg/dL (ref 0–149)

## 2024-06-26 ENCOUNTER — Encounter (HOSPITAL_COMMUNITY): Payer: Self-pay

## 2024-06-26 ENCOUNTER — Encounter (HOSPITAL_BASED_OUTPATIENT_CLINIC_OR_DEPARTMENT_OTHER): Payer: Self-pay

## 2024-06-26 NOTE — Telephone Encounter (Signed)
 Pt following up

## 2024-06-27 ENCOUNTER — Encounter (HOSPITAL_BASED_OUTPATIENT_CLINIC_OR_DEPARTMENT_OTHER): Payer: Self-pay

## 2024-06-27 ENCOUNTER — Ambulatory Visit
Admission: RE | Admit: 2024-06-27 | Discharge: 2024-06-27 | Disposition: A | Discharge status: Home / Self Care | Source: Ambulatory Visit | Attending: Nurse Practitioner | Admitting: Nurse Practitioner

## 2024-06-27 DIAGNOSIS — Z13220 Encounter for screening for lipoid disorders: Secondary | ICD-10-CM

## 2024-06-27 MED ORDER — REGADENOSON 0.4 MG/5ML IV SOLN
INTRAVENOUS | Status: AC
  Filled 2024-06-27: qty 5

## 2024-06-28 ENCOUNTER — Encounter: Payer: Self-pay | Admitting: Cardiology

## 2024-07-01 ENCOUNTER — Ambulatory Visit

## 2024-07-01 DIAGNOSIS — Z8673 Personal history of transient ischemic attack (TIA), and cerebral infarction without residual deficits: Secondary | ICD-10-CM | POA: Diagnosis not present

## 2024-07-02 LAB — CUP PACEART REMOTE DEVICE CHECK
Date Time Interrogation Session: 20250706235507
Implantable Pulse Generator Implant Date: 20221201

## 2024-07-05 ENCOUNTER — Encounter

## 2024-07-09 ENCOUNTER — Encounter (HOSPITAL_COMMUNITY): Payer: Self-pay

## 2024-07-11 ENCOUNTER — Ambulatory Visit
Admission: RE | Admit: 2024-07-11 | Discharge: 2024-07-11 | Disposition: A | Discharge status: Home / Self Care | Source: Ambulatory Visit | Attending: Nurse Practitioner | Admitting: Nurse Practitioner

## 2024-07-11 ENCOUNTER — Encounter (HOSPITAL_BASED_OUTPATIENT_CLINIC_OR_DEPARTMENT_OTHER): Payer: Self-pay

## 2024-07-11 DIAGNOSIS — I2510 Atherosclerotic heart disease of native coronary artery without angina pectoris: Secondary | ICD-10-CM | POA: Diagnosis not present

## 2024-07-11 DIAGNOSIS — M47814 Spondylosis without myelopathy or radiculopathy, thoracic region: Secondary | ICD-10-CM | POA: Diagnosis not present

## 2024-07-11 DIAGNOSIS — Z13220 Encounter for screening for lipoid disorders: Secondary | ICD-10-CM | POA: Insufficient documentation

## 2024-07-11 DIAGNOSIS — K8020 Calculus of gallbladder without cholecystitis without obstruction: Secondary | ICD-10-CM | POA: Insufficient documentation

## 2024-07-11 DIAGNOSIS — I700 Atherosclerosis of aorta: Secondary | ICD-10-CM | POA: Diagnosis not present

## 2024-07-11 LAB — NM PET CT CARDIAC PERFUSION MULTI W/ABSOLUTE BLOODFLOW
LV dias vol: 115 mL (ref 62–150)
LV sys vol: 68 mL (ref 4.2–5.8)
MBFR: 1.59
Nuc Rest EF: 40 %
Nuc Stress EF: 41 %
Peak HR: 86 {beats}/min
Rest HR: 82 {beats}/min
Rest MBF: 0.87 ml/g/min
Rest Nuclear Isotope Dose: 25 mCi
SRS: 0
SSS: 7
ST Depression (mm): 0 mm
Stress MBF: 1.38 ml/g/min
Stress Nuclear Isotope Dose: 25 mCi
TID: 1.01

## 2024-07-11 MED ORDER — REGADENOSON 0.4 MG/5ML IV SOLN
0.4000 mg | Freq: Once | INTRAVENOUS | Status: AC
  Administered 2024-07-11: 0.4 mg via INTRAVENOUS
  Filled 2024-07-11: qty 5

## 2024-07-11 MED ORDER — RUBIDIUM RB82 GENERATOR (RUBYFILL)
25.0000 | PACK | Freq: Once | INTRAVENOUS | Status: AC
  Administered 2024-07-11: 25.04 via INTRAVENOUS

## 2024-07-11 MED ORDER — REGADENOSON 0.4 MG/5ML IV SOLN
INTRAVENOUS | Status: AC
  Filled 2024-07-11: qty 5

## 2024-07-11 MED ORDER — RUBIDIUM RB82 GENERATOR (RUBYFILL)
25.0000 | PACK | Freq: Once | INTRAVENOUS | Status: AC
  Administered 2024-07-11: 25.02 via INTRAVENOUS

## 2024-07-12 ENCOUNTER — Telehealth (HOSPITAL_BASED_OUTPATIENT_CLINIC_OR_DEPARTMENT_OTHER): Payer: Self-pay | Admitting: Nurse Practitioner

## 2024-07-12 MED ORDER — NITROGLYCERIN 0.4 MG SL SUBL
0.4000 mg | SUBLINGUAL_TABLET | SUBLINGUAL | 3 refills | Status: DC | PRN
Start: 2024-07-12 — End: 2024-07-18

## 2024-07-12 NOTE — Telephone Encounter (Signed)
 Attempted to call patient, call goes straight to voicemail. Detailed message left including plan to call him Monday morning to arrange cardiac cath for next week. Rx for SL NTG sent to pt's pharmacy with instructions on use and not to use sildenafil within 48 hours of NTG use. Advised him to start aspirin  81 mg until cardiac cath (he is on Eliquis  for a fib). Also reviewed ER precautions and to continue atenolol .   Pt would like to have cath on Tuesday. We will call cath lab to try to arrance for left heart cath with possible coronary intervention for 07/16/24, or later in the week if no time available on that day.

## 2024-07-12 NOTE — Telephone Encounter (Signed)
 Patient called to speak with [name]. He is aware that he will get a call back Monday morning to arrange cath.

## 2024-07-12 NOTE — Addendum Note (Signed)
 Addended by: PERCY ROSALINE HERO on: 07/12/2024 04:46 PM   Modules accepted: Orders

## 2024-07-13 ENCOUNTER — Ambulatory Visit: Payer: Self-pay | Admitting: Cardiology

## 2024-07-15 ENCOUNTER — Encounter (HOSPITAL_BASED_OUTPATIENT_CLINIC_OR_DEPARTMENT_OTHER): Payer: Self-pay | Admitting: *Deleted

## 2024-07-15 ENCOUNTER — Other Ambulatory Visit (HOSPITAL_BASED_OUTPATIENT_CLINIC_OR_DEPARTMENT_OTHER): Payer: Self-pay | Admitting: *Deleted

## 2024-07-15 ENCOUNTER — Telehealth (HOSPITAL_BASED_OUTPATIENT_CLINIC_OR_DEPARTMENT_OTHER): Payer: Self-pay | Admitting: *Deleted

## 2024-07-15 DIAGNOSIS — I2510 Atherosclerotic heart disease of native coronary artery without angina pectoris: Secondary | ICD-10-CM

## 2024-07-15 LAB — BASIC METABOLIC PANEL WITH GFR
BUN/Creatinine Ratio: 32 — ABNORMAL HIGH (ref 10–24)
BUN: 24 mg/dL (ref 8–27)
CO2: 21 mmol/L (ref 20–29)
Calcium: 9.3 mg/dL (ref 8.6–10.2)
Chloride: 103 mmol/L (ref 96–106)
Creatinine, Ser: 0.76 mg/dL (ref 0.76–1.27)
Glucose: 92 mg/dL (ref 70–99)
Potassium: 4.8 mmol/L (ref 3.5–5.2)
Sodium: 139 mmol/L (ref 134–144)
eGFR: 101 mL/min/1.73 (ref >59)

## 2024-07-15 LAB — CBC
Hematocrit: 45.7 % (ref 37.5–51.0)
Hemoglobin: 14.7 g/dL (ref 13.0–17.7)
MCH: 31 pg (ref 26.6–33.0)
MCHC: 32.2 g/dL (ref 31.5–35.7)
MCV: 96 fL (ref 79–97)
Platelets: 206 x10E3/uL (ref 150–450)
RBC: 4.74 x10E6/uL (ref 4.14–5.80)
RDW: 13.4 % (ref 11.6–15.4)
WBC: 10.1 x10E3/uL (ref 3.4–10.8)

## 2024-07-15 NOTE — Telephone Encounter (Signed)
 S/w pt is aware cath set up would not be till Thursday, July 24 per Rosaline Percy PIETY. Pt has taken Eliquis  today.   Per Rosaline czar needs left cors and possible, wants to be scheduled for tomorrow. I've been having to leave voice mails and send my chart messages bc his phone goes directly to VM when I call. pls find out a good time for me to call him if you get in touch with him so I can review risks. I told him to start asa and he will need to hold his eliquis  - actually he's got to hold for 48 hours so we will need to schedule him for Wednesday bc I did not tell him to hold it.  Scheduled LHC for Thursday, July 24 @ 10:30 will notify pt through [place]. Ordered and released labs.

## 2024-07-15 NOTE — Addendum Note (Signed)
 Addended by: PERCY ROSALINE HERO on: 07/15/2024 08:55 AM   Modules accepted: Orders

## 2024-07-15 NOTE — Telephone Encounter (Signed)
See telephone note 7/21

## 2024-07-16 ENCOUNTER — Ambulatory Visit: Payer: Self-pay | Admitting: Nurse Practitioner

## 2024-07-16 NOTE — Progress Notes (Signed)
 [facility] Summary Report / Loop Recorder

## 2024-07-17 ENCOUNTER — Telehealth: Payer: Self-pay | Admitting: *Deleted

## 2024-07-17 NOTE — Telephone Encounter (Addendum)
 Cardiac Catheterization scheduled at [facility] for: Thursday July 18, 2024 10:30 AM Arrival time [facility] Main Entrance A at: 8:30 AM  Nothing to eat after midnight prior to procedure, clear liquids until 5 AM day of procedure.  Medication instructions: -Hold:  Eliquis -pt tells me last dose AM 07/17/23-knows to hold until post procedure -Other usual morning medications can be taken with sips of water  including aspirin  81 mg.  Plan to go home the same day, you will only stay overnight if medically necessary.  You must have responsible adult to drive you home.  Someone must be with you the first 24 hours after you arrive home.  Reviewed procedure instructions with patient.  Per Dr Ardena can update (H&P) if clinically no reason for you to see him again.  Rosaline Bane, NP did update her 06/12/24 Office Note.

## 2024-07-18 ENCOUNTER — Encounter (HOSPITAL_COMMUNITY)
Admission: RE | Disposition: A | Discharge status: Home / Self Care | Payer: Self-pay | Source: Home / Self Care | Attending: Cardiology

## 2024-07-18 ENCOUNTER — Other Ambulatory Visit

## 2024-07-18 ENCOUNTER — Other Ambulatory Visit: Payer: Self-pay

## 2024-07-18 ENCOUNTER — Ambulatory Visit (HOSPITAL_COMMUNITY)
Admission: RE | Admit: 2024-07-18 | Discharge: 2024-07-18 | Disposition: A | Discharge status: Home / Self Care | Attending: Cardiology | Admitting: Cardiology

## 2024-07-18 ENCOUNTER — Encounter (HOSPITAL_COMMUNITY): Payer: Self-pay | Admitting: Cardiology

## 2024-07-18 DIAGNOSIS — Z8673 Personal history of transient ischemic attack (TIA), and cerebral infarction without residual deficits: Secondary | ICD-10-CM | POA: Diagnosis not present

## 2024-07-18 DIAGNOSIS — Z87891 Personal history of nicotine dependence: Secondary | ICD-10-CM | POA: Insufficient documentation

## 2024-07-18 DIAGNOSIS — I25118 Atherosclerotic heart disease of native coronary artery with other forms of angina pectoris: Secondary | ICD-10-CM | POA: Insufficient documentation

## 2024-07-18 DIAGNOSIS — I480 Paroxysmal atrial fibrillation: Secondary | ICD-10-CM | POA: Diagnosis not present

## 2024-07-18 DIAGNOSIS — I10 Essential (primary) hypertension: Secondary | ICD-10-CM | POA: Insufficient documentation

## 2024-07-18 DIAGNOSIS — I2584 Coronary atherosclerosis due to calcified coronary lesion: Secondary | ICD-10-CM | POA: Insufficient documentation

## 2024-07-18 DIAGNOSIS — R9430 Abnormal result of cardiovascular function study, unspecified: Secondary | ICD-10-CM

## 2024-07-18 DIAGNOSIS — I2510 Atherosclerotic heart disease of native coronary artery without angina pectoris: Secondary | ICD-10-CM | POA: Diagnosis not present

## 2024-07-18 DIAGNOSIS — Z79899 Other long term (current) drug therapy: Secondary | ICD-10-CM | POA: Diagnosis not present

## 2024-07-18 HISTORY — PX: LEFT HEART CATH AND CORONARY ANGIOGRAPHY: CATH118249

## 2024-07-18 SURGERY — LEFT HEART CATH AND CORONARY ANGIOGRAPHY
Anesthesia: LOCAL

## 2024-07-18 MED ORDER — MIDAZOLAM HCL 2 MG/2ML IJ SOLN
INTRAMUSCULAR | Status: DC | PRN
  Administered 2024-07-18: 1 mg via INTRAVENOUS

## 2024-07-18 MED ORDER — HEPARIN SODIUM (PORCINE) 1000 UNIT/ML IJ SOLN
INTRAMUSCULAR | Status: AC
  Filled 2024-07-18: qty 10

## 2024-07-18 MED ORDER — ASPIRIN 81 MG PO CHEW: 81.0000 mg | CHEWABLE_TABLET | ORAL | Status: DC

## 2024-07-18 MED ORDER — LIDOCAINE HCL (PF) 1 % IJ SOLN
INTRAMUSCULAR | Status: DC | PRN
  Administered 2024-07-18: 5 mL via INTRADERMAL

## 2024-07-18 MED ORDER — APIXABAN 5 MG PO TABS: 5.0000 mg | ORAL_TABLET | Freq: Two times a day (BID) | ORAL | Status: DC

## 2024-07-18 MED ORDER — SODIUM CHLORIDE 0.9 % WEIGHT BASED INFUSION: 3.0000 mL/kg/h | INTRAVENOUS | Status: AC

## 2024-07-18 MED ORDER — HEPARIN (PORCINE) IN NACL 1000-0.9 UT/500ML-% IV SOLN
INTRAVENOUS | Status: DC | PRN
  Administered 2024-07-18 (×2): 500 mL

## 2024-07-18 MED ORDER — FENTANYL CITRATE (PF) 100 MCG/2ML IJ SOLN
INTRAMUSCULAR | Status: AC
Start: 2024-07-18 — End: 2024-07-18
  Filled 2024-07-18: qty 2

## 2024-07-18 MED ORDER — VERAPAMIL HCL 2.5 MG/ML IV SOLN
INTRAVENOUS | Status: AC
  Filled 2024-07-18: qty 2

## 2024-07-18 MED ORDER — IOHEXOL 350 MG/ML SOLN
INTRAVENOUS | Status: DC | PRN
  Administered 2024-07-18: 45 mL

## 2024-07-18 MED ORDER — VERAPAMIL HCL 2.5 MG/ML IV SOLN
INTRAVENOUS | Status: DC | PRN
  Administered 2024-07-18: 10 mL via INTRA_ARTERIAL

## 2024-07-18 MED ORDER — HEPARIN SODIUM (PORCINE) 1000 UNIT/ML IJ SOLN
INTRAMUSCULAR | Status: DC | PRN
  Administered 2024-07-18: 5000 [IU] via INTRAVENOUS

## 2024-07-18 MED ORDER — FENTANYL CITRATE (PF) 100 MCG/2ML IJ SOLN
INTRAMUSCULAR | Status: DC | PRN
  Administered 2024-07-18: 25 ug via INTRAVENOUS

## 2024-07-18 MED ORDER — MIDAZOLAM HCL 2 MG/2ML IJ SOLN
INTRAMUSCULAR | Status: AC
  Filled 2024-07-18: qty 2

## 2024-07-18 MED ORDER — LIDOCAINE HCL (PF) 1 % IJ SOLN
INTRAMUSCULAR | Status: AC
Start: 2024-07-18 — End: 2024-07-18
  Filled 2024-07-18: qty 30

## 2024-07-18 MED ORDER — SODIUM CHLORIDE 0.9 % WEIGHT BASED INFUSION: 1.0000 mL/kg/h | INTRAVENOUS | Status: DC

## 2024-07-18 SURGICAL SUPPLY — 11 items
CATH INFINITI 5 FR JL3.5 (CATHETERS) IMPLANT
CATH INFINITI AMBI 5FR TG (CATHETERS) IMPLANT
DEVICE RAD COMP TR BAND LRG (VASCULAR PRODUCTS) IMPLANT
GLIDESHEATH SLEND A-KIT 6F 22G (SHEATH) IMPLANT
GUIDEWIRE INQWIRE 1.5J.035X260 (WIRE) IMPLANT
KIT HEART LEFT (KITS) IMPLANT
PACK CARDIAC CATHETERIZATION (CUSTOM PROCEDURE TRAY) ×1 IMPLANT
SHEATH PROBE COVER 6X72 (BAG) IMPLANT
TRANSDUCER W/MONITORING KIT (MISCELLANEOUS) IMPLANT
TUBING CIL FLEX 10 FLL-RA (TUBING) IMPLANT
WIRE HI TORQ VERSACORE-J 145CM (WIRE) IMPLANT

## 2024-07-18 NOTE — Interval H&P Note (Signed)
 History and Physical Interval Note:  07/18/2024 11:41 AM  [name]  has presented today for surgery, with the diagnosis of abn pet.  The various methods of treatment have been discussed with the patient and family. After consideration of risks, benefits and other options for treatment, the patient has consented to  Procedure(s): LEFT HEART CATH AND CORONARY ANGIOGRAPHY (N/A) as a surgical intervention.  The patient's history has been reviewed, patient examined, no change in status, stable for surgery.  I have reviewed the patient's chart and labs.  Questions were answered to the patient's satisfaction.     Jenniferlynn Saad J Dennise Bamber

## 2024-07-18 NOTE — H&P (Signed)
 Cardiology Admission History and Physical   Patient ID: [name] MRN: [id number]; DOB: 1961/07/24   Admission date: 07/18/2024  PCP:  Teresa Channel, MD   [facility] Providers Cardiologist:  Rosaline Bane, NP  Electrophysiologist: Dr. Kennyth, MD         Chief Complaint:  Chest pain  Patient Profile:   [name] is a 63 y.o. male with hypertension, paroxysmal A-fib, h/o stroke, coronary artery disease, intermittent chest/back pain between shoulders with exertion, abnormal PET/CT stress test  After detailed discussion with Rosaline Lie, ER, NP, patient agreed to proceed with coronary angiography for further evaluation and potential intervention.  History of Present Illness:   [name] is a 63 y/o male w/ with hypertension, paroxysmal A-fib, h/o stroke, coronary artery disease, intermittent chest/back pain between shoulders with exertion, abnormal PET/CT stress test  After detailed discussion with Rosaline Lie, ER, NP, patient agreed to proceed with coronary angiography for further evaluation and potential intervention.   Past Medical History:  Diagnosis Date   Arthritis    Atrial flutter (HCC)    diagnosed 2005   Depression    Diverticular disease    GERD (gastroesophageal reflux disease)    Obesity    Paroxysmal atrial fibrillation (HCC)    Primary localized osteoarthritis of left knee    Stroke (HCC) 03/01/2024    Past Surgical History:  Procedure Laterality Date   ATRIAL FIBRILLATION ABLATION N/A 01/24/2019   Procedure: ATRIAL FIBRILLATION ABLATION;  Surgeon: Kelsie Agent, MD;  Location: [facility];  Service: Cardiovascular;  Laterality: N/A;   ATRIAL FIBRILLATION ABLATION N/A 04/14/2020   Procedure: ATRIAL FIBRILLATION ABLATION;  Surgeon: Kelsie Agent, MD;  Location: [facility];  Service: Cardiovascular;  Laterality: N/A;   CARDIOVERSION N/A 12/06/2018   Procedure: CARDIOVERSION;  Surgeon: Loni Soyla LABOR, MD;  Location: [facility] ENDOSCOPY;  Service: Cardiovascular;  Laterality: N/A;   CARDIOVERSION N/A 03/13/2019   Procedure: CARDIOVERSION;  Surgeon: Delford Maude BROCKS, MD;  Location: [facility] ENDOSCOPY;  Service: Cardiovascular;  Laterality: N/A;   CARDIOVERSION N/A 03/05/2020   Procedure: CARDIOVERSION;  Surgeon: Barbaraann Darryle Ned, MD;  Location: [facility] ENDOSCOPY;  Service: Cardiovascular;  Laterality: N/A;   COLON SURGERY  12/07, 5/08, 10/08   COLONOSCOPY     COLOSTOMY  2007   COLOSTOMY REVERSAL     EYE SURGERY Bilateral    cataracts   INCISIONAL HERNIA REPAIR  2008   with mesh   KNEE ARTHROSCOPY Left 5/10   x2   LEFT HEART CATH AND CORONARY ANGIOGRAPHY N/A 10/01/2021   Procedure: LEFT HEART CATH AND CORONARY ANGIOGRAPHY;  Surgeon: Claudene Victory ORN, MD;  Location: [facility]VAS[facility] CV [facility];  Service: Cardiovascular;  Laterality: N/A;   MULTIPLE TOOTH EXTRACTIONS     neck fusion  5/11   REPLANTATION THUMB Right    TONSILLECTOMY     TOTAL KNEE ARTHROPLASTY Left 08/23/2017   TOTAL KNEE ARTHROPLASTY Left 08/23/2017   Procedure: LEFT TOTAL KNEE ARTHROPLASTY;  Surgeon: Jane Charleston, MD;  Location: [facility] OR;  Service: Orthopedics;  Laterality: Left;   WISDOM TOOTH EXTRACTION       Medications Prior to Admission: Prior to Admission medications   Medication Sig Start Date End Date Taking? Authorizing Provider  acetaminophen  (TYLENOL ) 650 MG CR tablet Take 1,300 mg by mouth 2 (two) times daily.   Yes [provider]  apixaban  (ELIQUIS ) 5 MG TABS tablet Take 1 tablet (5 mg total) by mouth 2 (two) times daily. 03/13/24  Yes Fenton, Clint R, PA  atenolol  (TENORMIN ) 50 MG tablet Take 1 tablet (50 mg total) by mouth 2 (two) times daily. 01/24/24  Yes Fenton, Clint R, PA  Cannabidiol POWD Apply 1 Application topically as needed (pain.). [name] CBD Muscle and Joint Cooling Roll on 2500 mg   Yes [provider]  diclofenac  (VOLTAREN ) 50 MG EC tablet Take 50 mg by mouth 2 (two) times daily.   Yes  [provider]  docusate sodium  (COLACE) 100 MG capsule Take 100 mg by mouth in the morning.   Yes [provider]  HOMEOPATHIC PRODUCTS EX Apply 1 Application topically 3 (three) times daily as needed (pain.). Hempvana Pain Relief Cream   Yes [provider]  Multiple Vitamins-Minerals (MULTIVITAMIN WITH MINERALS) tablet Take 1 tablet by mouth in the morning.   Yes [provider]  naloxone  (NARCAN ) nasal spray 4 mg/0.1 mL Place 1 spray into the nose as needed (opioid reversal). 01/11/23  Yes [provider]  nitroGLYCERIN  (NITROSTAT ) 0.4 MG SL tablet Place 1 tablet (0.4 mg total) under the tongue every 5 (five) minutes as needed for chest pain. 07/12/24  Yes Swinyer, Rosaline HERO, NP  oxyCODONE -acetaminophen  (PERCOCET) 7.5-325 MG tablet Take 1 tablet by mouth in the morning, at noon, and at bedtime. 05/27/24  Yes [provider]  sildenafil (REVATIO) 20 MG tablet Take 20-100 mg by mouth daily as needed (ED). 04/24/20  Yes [provider]  traMADol  (ULTRAM ) 50 MG tablet Take 100 mg by mouth 3 (three) times daily. 04/12/20  Yes [provider]  famotidine (PEPCID) 40 MG tablet Take 40 mg by mouth daily as needed for heartburn or indigestion.    [provider]  methocarbamol  (ROBAXIN ) 500 MG tablet Take 500 mg by mouth every 8 (eight) hours as needed for muscle spasms.     [provider]     Allergies:    Allergies  Allergen Reactions   Mushroom Swelling and Other (See Comments)    Causes tongue swelling that cuts of airway   Erythromycin Other (See Comments)    Abdominal cramps    Hydrocodone -Acetaminophen  Other (See Comments)    Makes him feel high   Mobic [Meloxicam] Other (See Comments)    Lower back and kidney pain    Prednisone Hives, Anxiety and Other (See Comments)    Makes him anxious   Z-Pak [Azithromycin] Nausea Only and Other (See Comments)    Severe stomach cramps    Cefuroxime      worsening back pain?   Celecoxib     myalgias   Duloxetine Hcl     spacy   Lopressor  [Metoprolol  Tartrate] Swelling   Metaxalone     Unknown   Amoxicillin Palpitations   Penicillins Rash and Other (See Comments)    Has patient had a PCN reaction causing immediate rash, facial/tongue/throat swelling, SOB or lightheadedness with hypotension: No Has patient had a PCN reaction causing severe rash involving mucus membranes or skin necrosis: No Has patient had a PCN reaction that required hospitalization: No Has patient had a PCN reaction occurring within the last 10 years: No If all of the above answers are NO, then may proceed with Cephalosporin use.      Social History:   Social History   Socioeconomic History   Marital status: Married    Spouse name: Not on file   Number of children: Not on file   Years of education: Not on file   Highest education level:  Not on file  Occupational History   Not on file  Tobacco Use   Smoking status: Former    Current packs/day: 0.00    Types: Cigarettes    Start date: 08/11/2010    Quit date: 08/11/2012    Years since quitting: 11.9   Smokeless tobacco: Never   Tobacco comments:    Former smoker 09/26/22  Vaping Use   Vaping status: Never Used  Substance and Sexual Activity   Alcohol use: Yes    Alcohol/week: 2.0 standard drinks of alcohol    Types: 2 Cans of beer per week    Comment: on occassion   Drug use: No   Sexual activity: Not on file    Comment: married ([name])  Other Topics Concern   Not on file  Social History Narrative   Not on file   Social Drivers of Health   Financial Resource Strain: Low Risk  (11/26/2021)   Received from [facility]   Overall Financial Resource Strain (CARDIA)    Difficulty of Paying Living Expenses: Not very hard  Food Insecurity: No Food Insecurity (03/01/2024)   Hunger Vital Sign    Worried About Running Out of Food in the Last Year: Never true    Ran Out of Food in the Last Year:  Never true  Transportation Needs: No Transportation Needs (03/01/2024)   PRAPARE - Administrator, Civil Service (Medical): No    Lack of Transportation (Non-Medical): No  Physical Activity: Sufficiently Active (06/12/2024)   Exercise Vital Sign    Days of Exercise per Week: 5 days    Minutes of Exercise per Session: 50 min  Stress: No Stress Concern Present (06/12/2024)   [employer] of Occupational Health - Occupational Stress Questionnaire    Feeling of Stress: Not at all  Social Connections: Not on file  Intimate Partner Violence: Not At Risk (03/01/2024)   Humiliation, Afraid, Rape, and Kick questionnaire    Fear of Current or Ex-Partner: No    Emotionally Abused: No    Physically Abused: No    Sexually Abused: No    Family History:   The patient's family history includes CAD in his father and sister; Cancer in his paternal grandfather; Colon polyps in his father; Diabetes in his father; Hypertension in his father and mother.    ROS:  Please see the history of present illness.  All other ROS reviewed and negative.     Physical Exam/Data:   Vitals:   07/18/24 0855  BP: (!) 141/90  Pulse: 65  Resp: 17  Temp: 98.1 F (36.7 C)  TempSrc: Oral  SpO2: 96%  Weight: 103.4 kg  Height: 5' 9 (1.753 m)   No intake or output data in the 24 hours ending 07/18/24 1102    07/18/2024    8:55 AM 06/12/2024    9:52 AM 05/09/2024    8:49 AM  Last 3 Weights  Weight (lbs) 228 lb 228 lb 235 lb 8 oz  Weight (kg) 103.42 kg 103.42 kg 106.822 kg     Body mass index is 33.67 kg/m.   Physical Exam Vitals and nursing note reviewed.  Constitutional:      General: He is not in acute distress. Neck:     Vascular: No JVD.  Cardiovascular:     Rate and Rhythm: Normal rate and regular rhythm.     Heart sounds: Normal heart sounds. No murmur heard. Pulmonary:     Effort: Pulmonary effort is normal.  Breath sounds: Normal breath sounds. No wheezing or rales.   Musculoskeletal:     Right lower leg: No edema.     Left lower leg: No edema.      EKG:  The ECG that was done on 07/18/2024 was personally reviewed and demonstrates Sinus rhythm with frequent PACs Otherwise normal EKG  Relevant CV Studies: PET/CT stress test 07/11/2024:   LV perfusion is abnormal. Defect 1: There is a medium defect with moderate reduction in uptake present in the apical to mid inferior and lateral location(s) that is partially reversible.   Rest left ventricular function is abnormal. Rest global function is moderately reduced. Rest EF: 40%. Stress left ventricular function is abnormal. Stress global function is moderately reduced. Stress EF: 41%.   Myocardial blood flow was computed to be 0.20ml/g/min at rest and 1.85ml/g/min at stress. Global myocardial blood flow reserve was 1.59 and was abnormal.   Coronary calcium was present on the attenuation correction CT images. Severe coronary calcifications were present.   Findings are consistent with infarction with ischemia and small region of possible infarct involving the mid lateral and apical lateral/inferior territories. The study is intermediate risk.  Reduced LVEF may be artifactually low due to gating difficulties associated with PAC's and PVC's.   Electronically signed by: Lonni Hanson, MD.    Laboratory Data:  High Sensitivity Troponin:  No results for input(s): TROPONINIHS in the last 720 hours.    Chemistry Recent Labs  Lab 07/15/24 0913  NA 139  K 4.8  CL 103  CO2 21  GLUCOSE 92  BUN 24  CREATININE 0.76  CALCIUM 9.3    No results for input(s): PROT, ALBUMIN , AST, ALT, ALKPHOS, BILITOT in the last 168 hours. Lipids No results for input(s): CHOL, TRIG, HDL, LABVLDL, LDLCALC, CHOLHDL in the last 168 hours. Hematology Recent Labs  Lab 07/15/24 0913  WBC 10.1  RBC 4.74  HGB 14.7  HCT 45.7  MCV 96  MCH 31.0  MCHC 32.2  RDW 13.4  PLT 206   Thyroid  No results for  input(s): TSH, FREET4 in the last 168 hours. BNPNo results for input(s): BNP, PROBNP in the last 168 hours.  DDimer No results for input(s): DDIMER in the last 168 hours.   Radiology/Studies:  No results found.   Assessment and Plan:   63 y/o male w/ with hypertension, paroxysmal A-fib, h/o stroke, coronary artery disease, intermittent chest/back pain between shoulders with exertion, abnormal PET/CT stress test  In addition to ongoing medical therapy, her patient is here for coronary angiography and possible intervention given angina symptoms and abnormal stress test.  Risk Assessment/Risk Scores:         CHA2DS2-VASc Score = 4   This indicates a 4.8% annual risk of stroke. The patient's score is based upon: CHF History: 0 HTN History: 1 Diabetes History: 0 Stroke History: 2 Vascular Disease History: 1 Age Score: 0 Gender Score: 0   Code Status: Full Code   For questions or updates, please contact [facility] Please consult [email or web address] for contact info under     Signed, Newman JINNY Lawrence, MD 07/18/2024 11:02 AM

## 2024-07-18 NOTE — Interval H&P Note (Signed)
 History and Physical Interval Note:  07/18/2024 11:09 AM  [name]  has presented today for surgery, with the diagnosis of abn pet.  The various methods of treatment have been discussed with the patient and family. After consideration of risks, benefits and other options for treatment, the patient has consented to  Procedure(s): LEFT HEART CATH AND CORONARY ANGIOGRAPHY (N/A) as a surgical intervention.  The patient's history has been reviewed, patient examined, no change in status, stable for surgery.  I have reviewed the patient's chart and labs.  Questions were answered to the patient's satisfaction.     Eunice Oldaker J Jaedyn Marrufo

## 2024-07-22 ENCOUNTER — Encounter (HOSPITAL_BASED_OUTPATIENT_CLINIC_OR_DEPARTMENT_OTHER): Payer: Self-pay

## 2024-07-22 DIAGNOSIS — I2510 Atherosclerotic heart disease of native coronary artery without angina pectoris: Secondary | ICD-10-CM

## 2024-07-23 MED ORDER — ROSUVASTATIN CALCIUM 20 MG PO TABS: 20.0000 mg | ORAL_TABLET | Freq: Every day | ORAL | 1 refills | Status: DC

## 2024-07-25 ENCOUNTER — Other Ambulatory Visit (HOSPITAL_COMMUNITY): Payer: Self-pay | Admitting: Physician Assistant

## 2024-07-26 ENCOUNTER — Encounter (HOSPITAL_BASED_OUTPATIENT_CLINIC_OR_DEPARTMENT_OTHER): Payer: Self-pay

## 2024-07-30 ENCOUNTER — Encounter (HOSPITAL_BASED_OUTPATIENT_CLINIC_OR_DEPARTMENT_OTHER): Payer: Self-pay

## 2024-08-01 ENCOUNTER — Ambulatory Visit

## 2024-08-01 ENCOUNTER — Encounter: Payer: Self-pay | Admitting: Cardiology

## 2024-08-01 DIAGNOSIS — Z8673 Personal history of transient ischemic attack (TIA), and cerebral infarction without residual deficits: Secondary | ICD-10-CM

## 2024-08-01 LAB — CUP PACEART REMOTE DEVICE CHECK
Date Time Interrogation Session: 20250806233936
Implantable Pulse Generator Implant Date: 20221201

## 2024-08-04 ENCOUNTER — Ambulatory Visit: Payer: Self-pay | Admitting: Cardiology

## 2024-08-09 ENCOUNTER — Encounter

## 2024-08-11 ENCOUNTER — Other Ambulatory Visit (HOSPITAL_COMMUNITY): Payer: Self-pay | Admitting: Physician Assistant

## 2024-08-11 DIAGNOSIS — I480 Paroxysmal atrial fibrillation: Secondary | ICD-10-CM

## 2024-08-12 NOTE — Telephone Encounter (Signed)
 Eliquis  5mg  refill request received. Patient is 63 years old, weight-103.4kg, Crea-0.76 on 07/15/24, Diagnosis-Afib, and last seen by Rosaline Bane 06/12/24. Dose is appropriate based on dosing criteria. Will send in refill to requested pharmacy.

## 2024-08-13 ENCOUNTER — Encounter (HOSPITAL_BASED_OUTPATIENT_CLINIC_OR_DEPARTMENT_OTHER): Payer: Self-pay

## 2024-08-19 NOTE — Progress Notes (Signed)
 Cardiology Office Note:  .   Date:  08/27/2024 ID:  [name], DOB [birth year], MRN [id number] PCP: Teresa Channel, MD [facility] Providers Cardiologist:  None   Patient Profile: .      PMH Coronary artery disease CAC Score 1083 (97th percentile) on CT 03/2020 LHC 10/01/2021 Diffuse 3 vessel atherosclerosis and calcification without focal obstructive disease LHC 07/18/2024 LM normal, mild diffuse distal 50% disease LCx mild diffuse disease RCA moderate diffuse disease with moderate calcification Right cerebellar stroke 03/01/2024 Hypertension Atrial fibrillation  S/p Convergent surgery at [facility] 2 prior ablations Return of PAF on ILR  Mitral regurgitation Mild MR on TTE 03/01/2024  Followed by EP for history of persistent A-fib and flutter, s/p ablation x 2 and convergent surgery on 11/2021 at [facility].  He had 2 previous ablations by Dr. Kelsie.  He remained episode free for approximately a year and a half post convergent surgery leading to the discontinuation of anticoagulation.  However he recently experienced a stroke with uncertainty regarding a second event prompting the resumption of Eliquis .  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  He had a loop recorder implanted that was not monitored for approximately 1 year.  Last device download remotely in April 2024.  Reports he stopped monitoring due to high cost and perceived lack of necessity after being taken off Eliquis .  Last clinic visit 03/15/2024 with Dr. Kennyth for consideration of watchman implant.  He reported palpitations.  He had a right cerebellar stroke 03/01/2024 and currently undergoing rehabilitation for his hand which is the only residual issue following stroke. He reported he was not interested in [place] device at this time.   Seen by me on 06/12/24 for follow-up of coronary artery disease. Had a stroke in March 2025, thought to be secondary to  A-fib. Long history of atrial fibrillation with multiple ablations. He underwent cardiac cath 09/2021 which revealed nonobstructive CAD. Symptoms of heat sensation with atrial fibrillation at rest with elevated heart rate up to 152 bpm. He uses cold compresses, cold fluids, and the Valsalva maneuver for management, occasionally resorting to prn amiodarone  that was prescribed by Dr Kelsie many years ago. He rarely has episodes of a fib with physical activity. 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 His typical routine is to get up at 5 am, take medications, do household chores, and exercise prior to going to work. He exercises 5-6 days per week, sometimes at home and other times at [facility] which also involves aquatic exercise.  He is bothered by significant back, hip and leg pain that is severe by the end of the day.  He occasionally has discomfort between his shoulder blades. He has wondered in the past if it was heart related. He has significant family history of his father who underwent bypass surgery at a young age.  His mother died following a fall which led to a stroke.  He denied shortness of breath, orthopnea, PND, edema, presyncope, syncope. Cholesterol has always been well-controlled. Due to history of diverticulitis, he has followed a high-protein low residue diet for several years including chicken, fish, yogurt, berries, vegetables and rarely eats red meat.  He rarely eats junk food but if he does he prefers pork rinds. He drinks 1 small Coke 0 daily and generally drinks 2 beers per day. Due to concern for worsening ischemia, he underwent cardiac PET CT 07/11/24 which revealed medium defect with moderate  reduction in uptake in apical to mid inferior and lateral locations that is partially reversible. EF is abnormal, there were gating difficulties due to frequent PACs/PVCs. Echo completed 09/01/24 revealed normal heart function. He denied chest pain or dyspnea but was  concerned about back pain that could potentially be concerning for angina. With prior moderate CAD noted on cath in 2022 and additional risk factors of prior stroke, hyperlipidemia, and family history he wished to proceed with cath.   Left heart cath 07/18/24 revealed mild diffuse disease in LAD with distal LAD 50%, mild diffuse disease in LCx and moderate diffuse disease with moderate calcification in RCA, fairly unchanged from 2022 cath, recommendation to continue medical management.        History of Present Illness: .    Discussed the use of AI scribe software for clinical note transcription with the patient, who gave verbal consent to proceed.  History of Present Illness [name] is a very pleasant 63 year old male who is here today for follow-up of CAD.  He experiences occasional heart palpitations described as fluttering.  He remains very active and denies chest pain, dyspnea, orthopnea, PND, presyncope, syncope.  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 He often feels fatigued in the afternoons. He recalls an episode of atrial fibrillation lasting several hours, which he managed with cold drinks, Valsalva maneuvers, and physical movement. Is sleeping better, averaging seven hours per night. He started rosuvastatin  20 mg daily but discontinued it after three days due to severe abdominal pain, which resolved after cessation. His diet includes chicken and lean red meat, with reduced sugar intake. He has increased his intake of salads.  We reviewed the results of his cardiac catheterization in detail.  BP is well-controlled at home.   No results found for: LIPOA    ROS: See HPI       Studies Reviewed: .          Risk Assessment/Calculations:    CHA2DS2-VASc Score = 4   This indicates a 4.8% annual risk of stroke. The patient's score is based  upon: CHF History: 0 HTN History: 1 Diabetes History: 0 Stroke History: 2 Vascular Disease History: 1 Age Score: 0 Gender Score: 0            Physical Exam:   VS: BP 116/64   Pulse 76   Ht 5' 9 (1.753 m)   Wt 232 lb 14.4 oz (105.6 kg)   SpO2 97%   BMI 34.39 kg/m   Wt Readings from Last 3 Encounters:  08/27/24 232 lb 14.4 oz (105.6 kg)  07/18/24 228 lb (103.4 kg)  06/12/24 228 lb (103.4 kg)     GEN: Well nourished, well developed in no acute distress NECK: No JVD; No carotid bruits CARDIAC: RRR, no murmurs, rubs, gallops RESPIRATORY:  Clear to auscultation without rales, wheezing or rhonchi  ABDOMEN: Soft, non-tender, non-distended EXTREMITIES:  No edema; No deformity   Assessment & Plan Coronary Artery Disease Seen 06/12/2024 for ischemia evaluation given mild to moderate non-obstructive CAD on cath 09/2021.  Cardiac PET/CT 07/11/24 revealed concern for medium defect in apical to mid inferior and lateral locations.  He underwent LHC on 07/18/2024 which showed mild diffuse disease in LAD with 50% stenosis distal LAD, mild diffuse disease in LCx, and moderate calcification in RCA.  No significant change from 2022 cath.  He remains  very active with regular work, house and  yard work, and working out and denies chest pain, dyspnea, or other symptoms concerning for angina.  No indication for further ischemic evaluation at this time. He is not on aspirin  in the setting of need for [facility]. LDL is not at goal and he did not tolerate rosuvastatin  20 mg daily. - Start rosuvastatin  10 mg every other day, notify us  of intolerance -Plan for fasting NMR/ALT/LP(a) in 3 months -Heart healthy mostly whole food based diet avoiding saturated fat, processed foods, simple carbohydrates, and sugar  -Aim for at least 150 minutes of moderate intensity exercise each week.    Persistent Atrial Fibrillation on Chronic Anticoagulation Palpitations Approximately 3% atrial burden on loop recorder despite 2  previous ablations and convergent surgery at [facility] in 2022. He notes occasional palpitations with rare episodes of sustained palpitations.This is managed with Valsalva and cold compresses, fluids. He occasionally takes amiodarone .  HR is well-controlled today. No bleeding concerns.   -Continue Eliquis  5 mg twice daily which is appropriate dose for stroke prevention for CHA2DS2-VASc score of 4 -Continue atenolol  for rate control -Management per EP cardiology  History of Stroke   History of right cerebellar stroke 02/2024.  Reports he was told it was likely secondary to atrial fibrillation. No acute concerns today.  -Continue OAC for stroke prevention secondary to a fib -Goal LDL < 70   Hyperlipidemia LDL goal < 70 NMR 06/24/2024 with LDL particle #1067, LDL-C 88, HDL-C 49, triglycerides 85, total cholesterol 153, and small LDL-P 402.  Previou Unfortunately, LP(a) was not collected at the time of his lab work. Rosuvastatin  20 mg daily caused significant gastrointestinal side effects.  -Start rosuvastatin  10 mg every other day by splitting 20 mg tablets.  -Plan for cholesterol labs in 2-3 months, including LP(a) -Notify us  if rosuvastatin  10 mg every other day causes concerning side effects        Dispo: 6 months with me  Signed, Rosaline Bane, NP-C

## 2024-08-22 ENCOUNTER — Other Ambulatory Visit (HOSPITAL_COMMUNITY): Payer: Self-pay | Admitting: Physician Assistant

## 2024-08-27 ENCOUNTER — Encounter (HOSPITAL_BASED_OUTPATIENT_CLINIC_OR_DEPARTMENT_OTHER): Payer: Self-pay | Admitting: Nurse Practitioner

## 2024-08-27 ENCOUNTER — Ambulatory Visit (INDEPENDENT_AMBULATORY_CARE_PROVIDER_SITE_OTHER): Admitting: Nurse Practitioner

## 2024-08-27 VITALS — BP 116/64 | HR 76 | Ht 69.0 in | Wt 232.9 lb

## 2024-08-27 DIAGNOSIS — D6869 Other thrombophilia: Secondary | ICD-10-CM | POA: Diagnosis not present

## 2024-08-27 DIAGNOSIS — R002 Palpitations: Secondary | ICD-10-CM

## 2024-08-27 DIAGNOSIS — I4811 Longstanding persistent atrial fibrillation: Secondary | ICD-10-CM

## 2024-08-27 DIAGNOSIS — Z8673 Personal history of transient ischemic attack (TIA), and cerebral infarction without residual deficits: Secondary | ICD-10-CM

## 2024-08-27 DIAGNOSIS — I4819 Other persistent atrial fibrillation: Secondary | ICD-10-CM | POA: Diagnosis not present

## 2024-08-27 DIAGNOSIS — E785 Hyperlipidemia, unspecified: Secondary | ICD-10-CM

## 2024-08-27 DIAGNOSIS — I2510 Atherosclerotic heart disease of native coronary artery without angina pectoris: Secondary | ICD-10-CM

## 2024-08-27 MED ORDER — ROSUVASTATIN CALCIUM 10 MG PO TABS: 10.0000 mg | ORAL_TABLET | ORAL | 1 refills | Status: DC

## 2024-08-27 NOTE — Patient Instructions (Signed)
 Medication Instructions:  DECREASE YOUR ROSUVASTATIN  TO 10 MG EVERY OTHER DAY   *If you need a refill on your cardiac medications before your next appointment, please call your pharmacy*  Lab Work: FASTING NMR,/ALT IN 3 MONTHS   If you have labs (blood work) drawn today and your tests are completely normal, you will receive your results only by: MyChart Message (if you have MyChart) OR A paper copy in the mail If you have any lab test that is abnormal or we need to change your treatment, we will call you to review the results.  Testing/Procedures: NONE  Follow-Up: At [facility], you and your health needs are our priority.  As part of our continuing mission to provide you with exceptional heart care, our providers are all part of one team.  This team includes your primary Cardiologist (physician) and Advanced Practice Providers or APPs (Physician Assistants and Nurse Practitioners) who all work together to provide you with the care you need, when you need it.  Your next appointment:   6 month(s)  Provider:   Rosaline Bane, NP     We recommend signing up for the patient portal called MyChart.  Sign up information is provided on this After Visit Summary.  MyChart is used to connect with patients for Virtual Visits (Telemedicine).  Patients are able to view lab/test results, encounter notes, upcoming appointments, etc.  Non-urgent messages can be sent to your provider as well.   To learn more about what you can do with MyChart, go to [email or web address].

## 2024-09-02 ENCOUNTER — Ambulatory Visit

## 2024-09-02 DIAGNOSIS — Z8673 Personal history of transient ischemic attack (TIA), and cerebral infarction without residual deficits: Secondary | ICD-10-CM | POA: Diagnosis not present

## 2024-09-02 LAB — CUP PACEART REMOTE DEVICE CHECK
Date Time Interrogation Session: 20250906232134
Implantable Pulse Generator Implant Date: 20221201

## 2024-09-09 ENCOUNTER — Encounter: Payer: Self-pay | Admitting: Family Medicine

## 2024-09-09 ENCOUNTER — Ambulatory Visit (INDEPENDENT_AMBULATORY_CARE_PROVIDER_SITE_OTHER): Admitting: Family Medicine

## 2024-09-09 VITALS — BP 116/74 | HR 70 | Ht 68.0 in | Wt 234.6 lb

## 2024-09-09 DIAGNOSIS — Z789 Other specified health status: Secondary | ICD-10-CM

## 2024-09-09 DIAGNOSIS — N529 Male erectile dysfunction, unspecified: Secondary | ICD-10-CM | POA: Diagnosis not present

## 2024-09-09 DIAGNOSIS — I480 Paroxysmal atrial fibrillation: Secondary | ICD-10-CM | POA: Diagnosis not present

## 2024-09-09 MED ORDER — SILDENAFIL CITRATE 20 MG PO TABS: 20.0000 mg | ORAL_TABLET | Freq: Every day | ORAL | 2 refills | Status: DC | PRN

## 2024-09-09 MED ORDER — COENZYME Q10 30 MG PO CAPS: 30.0000 mg | ORAL_CAPSULE | Freq: Three times a day (TID) | ORAL | 1 refills | Status: DC

## 2024-09-09 NOTE — Patient Instructions (Addendum)
 CO-ENZYME Q10, I'd like for you take it daily.

## 2024-09-09 NOTE — Progress Notes (Signed)
 Name: [name]   Date of Visit: 09/09/24   Date of last visit with me: Visit date not found   CHIEF COMPLAINT:  Chief Complaint  Patient presents with   Establish Care    New patient. Congested going on for 2 days, been taking over the counter medication.        HPI:  Discussed the use of AI scribe software for clinical note transcription with the patient, who gave verbal consent to proceed.  History of Present Illness   [name] is a 63 year old male with coronary artery disease and hyperlipidemia who presents for medication management and follow-up.  He has been managing his blood pressure at home, which remains stable, using a cardio mobile device for monitoring. He mentions that the COVID vaccine exacerbated his heart condition, leading to multiple ablations and cardioversions. A loop recorder is in place, and he is under the care of two cardiologists.  He is currently on a statin prescribed at a high dose initially, which caused gastrointestinal upset. The dose was reduced to 10 mg, taken every other day by cutting a 20 mg pill in half.  He has a history of partial intestine removal in 2007, which he believes may affect his tolerance to medications. He experiences congestion, which he attributes to seasonal changes, and does not use a humidifier regularly. He sleeps in a chair and has not had a recent colonoscopy or Cologuard test.  He has allergies to penicillin, amoxicillin, metaxalone, Lopressor , duloxetine, and celecoxib. He sees a pain doctor every two months and is up to date on his medications, though he may need refills soon.         OBJECTIVE:       09/09/2024    8:36 AM  Depression screen PHQ 2/9  Decreased Interest 0  Down, Depressed, Hopeless 0  PHQ - 2 Score 0     BP Readings from Last 3 Encounters:  09/09/24 116/74  08/27/24 116/64  07/18/24 112/60    BP 116/74   Pulse 70   Ht 5' 8 (1.727 m)   Wt 234 lb 9.6 oz (106.4 kg)    SpO2 97%   BMI 35.67 kg/m    Physical Exam          Physical Exam Constitutional:      Appearance: Normal appearance.  Neurological:     General: No focal deficit present.     Mental Status: He is alert and oriented to person, place, and time. Mental status is at baseline.     ASSESSMENT/PLAN:   Assessment & Plan Erectile disorder  Statin intolerance  Paroxysmal atrial fibrillation (HCC)    Assessment and Plan    Coronary artery disease with elevated coronary artery calcium  score and statin intolerance Coronary artery disease with high coronary artery calcium  score, increased risk despite normal lipids. Statin intolerance due to gastrointestinal upset. Current regimen may be inadequate. - Prescribe Coenzyme Q10 daily to reduce statin side effects. - Continue statin 10 mg every other day. - Discuss Repatha with cardiologist if intolerance persists. - Schedule follow-up blood panel late October/early November to assess lipids. - Consider increasing statin to 10 mg daily if tolerated after Coenzyme Q10.  Nasal congestion Nasal congestion likely due to seasonal changes and dry air. - Recommend humidifier in sleeping area.      A Fib - Continue eliquis  and atenolol , patient has monitoring done at home. Discussed flu vaccine however patient declined at this time.  Abby Stines A. Vita MD [facility]

## 2024-09-11 ENCOUNTER — Encounter (HOSPITAL_BASED_OUTPATIENT_CLINIC_OR_DEPARTMENT_OTHER): Payer: Self-pay

## 2024-09-12 NOTE — Progress Notes (Signed)
 Remote Loop Recorder Transmission

## 2024-09-13 ENCOUNTER — Encounter

## 2024-09-15 ENCOUNTER — Ambulatory Visit: Payer: Self-pay | Admitting: Cardiology

## 2024-09-20 ENCOUNTER — Other Ambulatory Visit: Payer: Self-pay | Admitting: Physician Assistant

## 2024-09-21 NOTE — Progress Notes (Signed)
 Remote Loop Recorder Transmission

## 2024-10-03 ENCOUNTER — Ambulatory Visit

## 2024-10-03 ENCOUNTER — Encounter

## 2024-10-03 DIAGNOSIS — Z8673 Personal history of transient ischemic attack (TIA), and cerebral infarction without residual deficits: Secondary | ICD-10-CM | POA: Diagnosis not present

## 2024-10-03 LAB — CUP PACEART REMOTE DEVICE CHECK
Date Time Interrogation Session: 20251008233911
Implantable Pulse Generator Implant Date: 20221201

## 2024-10-07 ENCOUNTER — Ambulatory Visit: Payer: Self-pay | Admitting: Cardiology

## 2024-10-07 NOTE — Progress Notes (Signed)
 Remote Loop Recorder Transmission

## 2024-10-14 ENCOUNTER — Ambulatory Visit: Admitting: Family Medicine

## 2024-10-14 ENCOUNTER — Encounter: Payer: Self-pay | Admitting: Family Medicine

## 2024-10-14 ENCOUNTER — Ambulatory Visit: Payer: Self-pay

## 2024-10-14 VITALS — BP 126/80 | HR 68 | Wt 233.2 lb

## 2024-10-14 DIAGNOSIS — J069 Acute upper respiratory infection, unspecified: Secondary | ICD-10-CM

## 2024-10-14 DIAGNOSIS — R0981 Nasal congestion: Secondary | ICD-10-CM | POA: Diagnosis not present

## 2024-10-14 DIAGNOSIS — N529 Male erectile dysfunction, unspecified: Secondary | ICD-10-CM

## 2024-10-14 MED ORDER — AMOXICILLIN-POT CLAVULANATE 875-125 MG PO TABS: 1.0000 | ORAL_TABLET | Freq: Two times a day (BID) | ORAL | 0 refills | Status: AC

## 2024-10-14 MED ORDER — FLUTICASONE PROPIONATE 50 MCG/ACT NA SUSP: 2.0000 | Freq: Every day | NASAL | 6 refills | Status: DC

## 2024-10-14 MED ORDER — SILDENAFIL CITRATE 20 MG PO TABS: 20.0000 mg | ORAL_TABLET | Freq: Every day | ORAL | 2 refills | Status: AC | PRN

## 2024-10-14 MED ORDER — METHYLPREDNISOLONE 4 MG PO TBPK: ORAL_TABLET | ORAL | 0 refills | Status: DC

## 2024-10-14 NOTE — Progress Notes (Signed)
 Name: [name]   Date of Visit: 10/14/24   Date of last visit with me: 09/09/2024   CHIEF COMPLAINT:  Chief Complaint  Patient presents with   other    Had congestion last time he was here, has gotten worse lately, in the past week stated coughing a lot more, coughing up phlegm taking emerge C, nasal congestion, no fever, ST from coughing and drainage,        HPI:  Discussed the use of AI scribe software for clinical note transcription with the patient, who gave verbal consent to proceed.  History of Present Illness   [name] is a 63 year old male who presents with persistent congestion and shortness of breath.  He has been experiencing persistent congestion and shortness of breath, describing it as feeling like he is 'breathing through something.' These symptoms have been ongoing since his last visit and have worsened over time. He finds that using a humidifier helps him sleep, but he still wakes up feeling tired. No significant sinus drainage is noted, but he feels congestion in his throat, leading to self-induced coughing to clear it.  He has a known allergy to prednisone, which makes him feel like he is 'climbing.' He was previously prescribed an inhaler a couple of years ago for similar symptoms, which he found helpful. He cannot take Z-Pak due to intolerance and experiences palpitations with amoxicillin, although he can tolerate it. He has used steroid creams in the past without issue.  He mentions using sildenafil , which he discovered helps alleviate severe back pain due to spinal cord narrowing at L3, L4. He takes it to increase blood flow, which helps with pain management, and occasionally for its intended use.         OBJECTIVE:       09/09/2024    8:36 AM  Depression screen PHQ 2/9  Decreased Interest 0  Down, Depressed, Hopeless 0  PHQ - 2 Score 0     BP Readings from Last 3 Encounters:  10/14/24 126/80  09/09/24 116/74  08/27/24 116/64     BP 126/80   Pulse 68   Wt 233 lb 3.2 oz (105.8 kg)   SpO2 95%   BMI 35.46 kg/m    Physical Exam          Physical Exam Constitutional:      Appearance: Normal appearance.  Pulmonary:     Effort: Pulmonary effort is normal.     Breath sounds: Normal breath sounds.  Neurological:     General: No focal deficit present.     Mental Status: He is alert and oriented to person, place, and time. Mental status is at baseline.     ASSESSMENT/PLAN:   Assessment & Plan Viral URI with cough  Sinus congestion  Erectile disorder    Assessment and Plan    Acute sinusitis with cough and fatigue Chronic congestion with worsening symptoms, including obstructed breathing sensation and fatigue. Cough due to throat irritation. Allergy to prednisone, intolerance to Z-Pak and penicillin. - Prescribed Augmentin for 7 days. - Prescribed Flonase nasal spray, 2 sprays each nostril at night. - Consider alternative steroids if methypred is not tolerated.  Lumbar spinal stenosis at L3-L4 Severe narrowing at L3-L4 causing significant back pain. Uses sildenafil  for pain relief by increasing blood flow.  Erectile dysfunction Uses sildenafil  for erectile dysfunction and back pain relief. Current prescription insufficient. - Increased sildenafil  prescription to 90 tablets.  Vani Gunner A. Vita MD [facility]

## 2024-10-14 NOTE — Telephone Encounter (Signed)
 FYI Only or Action Required?: FYI only for provider.  Patient was last seen in primary care on 09/09/2024 by Vita Morrow, MD.  Called Nurse Triage reporting Cough.  Symptoms began about a month ago. Worsening over the last week  Interventions attempted: Rest, hydration, or home remedies.  Symptoms are: gradually worsening.  Triage Disposition: See HCP Within 4 Hours (Or PCP Triage)  Patient/caregiver understands and will follow disposition?: Yes      Copied from CRM #[id number]. Topic: Clinical - Red Word Triage >> Oct 14, 2024  8:05 AM [name] wrote: Red Word that prompted transfer to Nurse Triage: Congestion has gotten worse, constantly coughing, patient thinks he has bronchitis. Really tired. Limiting to what he can take OTC cause he has a-fib. Reason for Disposition  Wheezing is present  Answer Assessment - Initial Assessment Questions 1. ONSET: When did the cough begin?      X 1 month, worsening last week  3. SPUTUM: Describe the color of your sputum (e.g., none, dry cough; clear, white, yellow, green)     Milky white 4. HEMOPTYSIS: Are you coughing up any blood? If Yes, ask: How much? (e.g., flecks, streaks, tablespoons, etc.)     None 5. DIFFICULTY BREATHING: Are you having difficulty breathing? If Yes, ask: How bad is it? (e.g., mild, moderate, severe)      SOB with exertion 6. FEVER: Do you have a fever? If Yes, ask: What is your temperature, how was it measured, and when did it start?     None 9. PE RISK FACTORS: Do you have a history of blood clots? (or: recent major surgery, recent prolonged travel, bedridden)     None 10. OTHER SYMPTOMS: Do you have any other symptoms? (e.g., runny nose, wheezing, chest pain)       Nasal/chest congestion  Protocols used: Cough - Acute Productive-A-AH

## 2024-10-16 ENCOUNTER — Encounter: Payer: Self-pay | Admitting: Family Medicine

## 2024-10-18 ENCOUNTER — Encounter

## 2024-10-21 ENCOUNTER — Encounter: Payer: Self-pay | Admitting: Cardiology

## 2024-10-29 ENCOUNTER — Ambulatory Visit: Payer: Self-pay | Admitting: Nurse Practitioner

## 2024-10-29 ENCOUNTER — Encounter (HOSPITAL_BASED_OUTPATIENT_CLINIC_OR_DEPARTMENT_OTHER): Payer: Self-pay

## 2024-10-29 LAB — NMR, LIPOPROFILE
Cholesterol, Total: 154 mg/dL (ref 100–199)
HDL Particle Number: 33.1 umol/L (ref >=30.5)
HDL-C: 54 mg/dL (ref >39)
LDL Particle Number: 860 nmol/L (ref <1000)
LDL Size: 20.6 nm (ref >20.5)
LDL-C (NIH Calc): 87 mg/dL (ref 0–99)
LP-IR Score: 51 — ABNORMAL HIGH (ref <=45)
Small LDL Particle Number: 337 nmol/L (ref <=527)
Triglycerides: 66 mg/dL (ref 0–149)

## 2024-10-29 LAB — ALT: ALT: 37 IU/L (ref 0–44)

## 2024-11-03 ENCOUNTER — Ambulatory Visit (INDEPENDENT_AMBULATORY_CARE_PROVIDER_SITE_OTHER)

## 2024-11-03 DIAGNOSIS — I2510 Atherosclerotic heart disease of native coronary artery without angina pectoris: Secondary | ICD-10-CM

## 2024-11-03 LAB — CUP PACEART REMOTE DEVICE CHECK
Date Time Interrogation Session: 20251108232935
Implantable Pulse Generator Implant Date: 20221201

## 2024-11-04 ENCOUNTER — Encounter

## 2024-11-04 ENCOUNTER — Ambulatory Visit: Payer: Self-pay | Admitting: Cardiology

## 2024-11-07 NOTE — Progress Notes (Signed)
 Remote Loop Recorder Transmission

## 2024-11-11 ENCOUNTER — Ambulatory Visit (INDEPENDENT_AMBULATORY_CARE_PROVIDER_SITE_OTHER): Payer: Self-pay | Admitting: Family Medicine

## 2024-11-11 ENCOUNTER — Encounter: Payer: Self-pay | Admitting: Family Medicine

## 2024-11-11 VITALS — BP 132/80 | HR 60 | Resp 18 | Ht 68.0 in | Wt 233.0 lb

## 2024-11-11 DIAGNOSIS — Z0000 Encounter for general adult medical examination without abnormal findings: Secondary | ICD-10-CM

## 2024-11-11 DIAGNOSIS — Z125 Encounter for screening for malignant neoplasm of prostate: Secondary | ICD-10-CM

## 2024-11-11 DIAGNOSIS — I480 Paroxysmal atrial fibrillation: Secondary | ICD-10-CM

## 2024-11-11 DIAGNOSIS — Z789 Other specified health status: Secondary | ICD-10-CM | POA: Diagnosis not present

## 2024-11-11 NOTE — Progress Notes (Signed)
 Name: [name]   Date of Visit: 11/11/24   Date of last visit with me: 10/14/2024   CHIEF COMPLAINT:  Chief Complaint  Patient presents with   Annual Exam    Fasting annual exam, no concerns. Declined flu, prevnar and covid vaccines. Has not had colonoscopy since 2008, has part of colon removed and has not had another since-asked of he would do Cologuard and he said he needs to think about it.        HPI:  Discussed the use of AI scribe software for clinical note transcription with the patient, who gave verbal consent to proceed.  History of Present Illness   Kingstyn Deruiter is a 63 year old male who presents for follow-up regarding cholesterol management and medication tolerance.  He has been monitoring his diet closely and recently reintroduced red meat after abstaining for thirty-four days. Despite being on a statin, his cholesterol remains a concern. He was initially prescribed 20 mg of Crestor , which was reduced to 10 mg due to gastrointestinal side effects, and he has since stopped taking the medication due to these side effects.  He discusses his experience with tramadol , noting initial issues but eventual tolerance after gradual introduction.  He uses a humidifier continuously, which has helped alleviate symptoms of dryness and morning cough. He no longer uses nasal spray.      OBJECTIVE:       11/11/2024    9:08 AM  Depression screen PHQ 2/9  Decreased Interest 0  Down, Depressed, Hopeless 0  PHQ - 2 Score 0     BP Readings from Last 3 Encounters:  11/11/24 132/80  10/14/24 126/80  09/09/24 116/74    BP 132/80   Pulse 60   Resp 18   Ht 5' 8 (1.727 m)   Wt 233 lb (105.7 kg)   BMI 35.43 kg/m    Physical Exam          Physical Exam Constitutional:      Appearance: Normal appearance.  HENT:     Nose: Congestion present. No rhinorrhea.  Cardiovascular:     Pulses: Normal pulses.  Neurological:     General: No focal deficit  present.     Mental Status: He is alert and oriented to person, place, and time. Mental status is at baseline.     ASSESSMENT/PLAN:   Assessment & Plan Annual physical exam  Screening for prostate cancer  Paroxysmal atrial fibrillation (HCC)  Statin intolerance  Morbid obesity (HCC)    Assessment and Plan    Adult Wellness Visit Overall well-being. Beneficial use of humidifier for nasal dryness. Advised long-term nasal spray use for inflammation and drainage. - Continue using humidifier regularly. - Consider using nasal spray nightly to reduce nasal inflammation and drainage. -Comprehensive annual physical exam completed today. Reviewed interval history, current medical issues, medications, allergies, and preventive care needs. Addressed all patient questions and concerns. Discussed lifestyle factors including diet, exercise, sleep, and stress management. Reviewed recommended age-appropriate screenings, labs, and vaccinations. Counseling provided on healthy habits and routine health maintenance. Follow-up as indicated based on findings and results.  Hypercholesterolemia Cholesterol remains elevated despite dietary changes. Previous Crestor  use caused GI upset. Reintroduce statin therapy with gradual dosage increase. Discussed diet management but medication needed due to age-related changes. - Start Crestor  at the lowest dose in the morning to build tolerance. - Gradually increase Crestor  dosage as tolerated. - CMP and cbc pending ' Screening for malignant neoplasm of prostate Routine  PSA testing for prostate health monitoring. - Ordered PSA test to monitor prostate health.         Petrea Fredenburg A. Vita MD [facility]

## 2024-11-12 ENCOUNTER — Ambulatory Visit: Payer: Self-pay | Admitting: Family Medicine

## 2024-11-12 LAB — CBC WITH DIFFERENTIAL/PLATELET
Basophils Absolute: 0.1 x10E3/uL (ref 0.0–0.2)
Basos: 1 %
EOS (ABSOLUTE): 0.3 x10E3/uL (ref 0.0–0.4)
Eos: 4 %
Hematocrit: 44.8 % (ref 37.5–51.0)
Hemoglobin: 14.9 g/dL (ref 13.0–17.7)
Immature Grans (Abs): 0 x10E3/uL (ref 0.0–0.1)
Immature Granulocytes: 0 %
Lymphocytes Absolute: 2.1 x10E3/uL (ref 0.7–3.1)
Lymphs: 29 %
MCH: 31.6 pg (ref 26.6–33.0)
MCHC: 33.3 g/dL (ref 31.5–35.7)
MCV: 95 fL (ref 79–97)
Monocytes Absolute: 0.4 x10E3/uL (ref 0.1–0.9)
Monocytes: 6 %
Neutrophils Absolute: 4.4 x10E3/uL (ref 1.4–7.0)
Neutrophils: 60 %
Platelets: 213 x10E3/uL (ref 150–450)
RBC: 4.71 x10E6/uL (ref 4.14–5.80)
RDW: 12.7 % (ref 11.6–15.4)
WBC: 7.2 x10E3/uL (ref 3.4–10.8)

## 2024-11-12 LAB — COMPREHENSIVE METABOLIC PANEL WITH GFR
ALT: 36 IU/L (ref 0–44)
AST: 32 IU/L (ref 0–40)
Albumin: 4.3 g/dL (ref 3.9–4.9)
Alkaline Phosphatase: 69 IU/L (ref 47–123)
BUN/Creatinine Ratio: 18 (ref 10–24)
BUN: 16 mg/dL (ref 8–27)
Bilirubin Total: 0.3 mg/dL (ref 0.0–1.2)
CO2: 23 mmol/L (ref 20–29)
Calcium: 9 mg/dL (ref 8.6–10.2)
Chloride: 108 mmol/L — ABNORMAL HIGH (ref 96–106)
Creatinine, Ser: 0.88 mg/dL (ref 0.76–1.27)
Globulin, Total: 2.1 g/dL (ref 1.5–4.5)
Glucose: 89 mg/dL (ref 70–99)
Potassium: 4.8 mmol/L (ref 3.5–5.2)
Sodium: 144 mmol/L (ref 134–144)
Total Protein: 6.4 g/dL (ref 6.0–8.5)
eGFR: 96 mL/min/1.73 (ref >59)

## 2024-11-12 LAB — PSA: Prostate Specific Ag, Serum: 0.6 ng/mL (ref 0.0–4.0)

## 2024-11-17 ENCOUNTER — Encounter (HOSPITAL_BASED_OUTPATIENT_CLINIC_OR_DEPARTMENT_OTHER): Payer: Self-pay

## 2024-11-25 ENCOUNTER — Telehealth: Payer: Self-pay

## 2024-11-25 ENCOUNTER — Encounter

## 2024-11-25 NOTE — Telephone Encounter (Signed)
 Received message patient wishes to re-discuss Watchman implant. Attempted to call patient to arrange consult. Left voicemail for him to return call.

## 2024-11-26 NOTE — Telephone Encounter (Signed)
 Spoke with patient. Arranged consult with Dr. Kennyth 01/24/2024.

## 2024-12-04 ENCOUNTER — Encounter

## 2024-12-04 NOTE — Progress Notes (Signed)
 [facility] [address] [place]. [place] [address] [phone]       OFFICE FOLLOW-UP NOTE  [name] Date of Birth:  [birth year] Medical Record Number:  [id number]    Chief Complaint  Patient presents with   Cerebrovascular Accident    Rm 8 alone  Pt is well and stable,  reports no new stroke concerns.  He does mention he feels he has hyper hearing. His hearing is sensitive and everything is loud to him.       HPI:   Update 12/05/2024 JM: Patient returns for follow-up visit.  Overall stable from stroke standpoint without new stroke/TIA symptoms. Does mention being sensitive to loud noise or sounds, can also feel overstimulated if in an area where there is a lot of different conversations/noises. This has been present since his stroke. Mild difficulty with right hand functioning, more so with brushing teeth and using a salt/pepper shaker. Denies issues with writing, worked on this after his stroke and greatly improved. He continues to work doing ambulance person, works with small items frequently and denies any difficulty with this. Previously working out routinely at [employer] but has not been going as much recently due to issues with sinuses over the past 2 months. Compliant on Eliquis  without side effects.  Discontinued Crestor  due to GI side effects, PCP recently recommended restarting at lowest dose with gradual increase but still had difficulty tolerating.  Recent LDL 87. He opted to modify diet to improve/manage cholesterol, very rarely eats red meat, avoids processed foods, limited carbohydrates.  Routinely follows with PCP and cardiology. He is scheduled with cardiac EP next month to discuss watchman device. Denies issues with Eliquis  but prefers to not take if he doesn't have to.  No further questions or concerns at this time.    Consult visit 05/09/2024 Dr. Rosemarie: [name] is a 63 year old Caucasian male seen today for initial office visit  following hospital consultation for stroke in March 2025.  History is obtained from the patient and review of electronic medical records.  I personally reviewed pertinent available imaging films in PACS.  He has past medical history of atrial flutter/fibrillation, obesity, gastroesophageal reflux disease, depression and diverticular disease.  He presented on 03/01/2024 with sudden onset of dry heaves, dizziness, headache, slurred speech and balance issues.  He was unable to walk and required 2 people to get him up because of imbalance.  NIH stroke scale on admission was 3 due to limb ataxia and sensory impairment.  Code stroke CT was unremarkable.  CT angiogram showed occlusion of the right vertebral artery at its origin with reconstitution in the V3 and V4 segments.  Right PICA was patent.  MRI scan showed right superior cerebellar infarct with petechial hemorrhage.  2D echo showed ejection fraction of 60 to 65%.  LDL cholesterol 67 mg percent.  Hemoglobin A1c was 5.4.  Patient had not been on any antithrombotics prior to admission and was started on Eliquis .  He was discharged home without any residual deficits but returned on 03/08/2024 with sudden onset of dry heaves, dizziness, headache and incoordination.  MRI scan showed redemonstration of diffusion signal abnormality in the right cerebellum corresponding to the recent infarct with diffuse scattered foci of additional infarction not seen on the previous scan which were felt to be natural evolution rather than a new stroke.  Repeat CT angiogram of the brain and neck again showed no significant changes compared to the previous study from a week ago.  Patient  was continued on Eliquis  and states he has done well.  Still has a little bit of incoordination in his right hand back to baseline but balance and walking are fine.  He is tolerating Eliquis  well with minor bruising and no bleeding.  He is eating healthy and has lost 24 pounds since his stroke.  He sleeps in a  lift chair due to chronic back injuries and states he does not snore and he does not want to be evaluated for sleep apnea.  He has no new complaints     ROS:   14 system review of systems is positive for those listed in HPI and all other systems negative  PMH:  Past Medical History:  Diagnosis Date   Arthritis    Atrial flutter (HCC)    diagnosed 2005   Depression    Diverticular disease    GERD (gastroesophageal reflux disease)    Obesity    Paroxysmal atrial fibrillation (HCC)    Primary localized osteoarthritis of left knee    Stroke (HCC) 03/01/2024    Social History:  Social History   Socioeconomic History   Marital status: Married    Spouse name: Not on file   Number of children: Not on file   Years of education: Not on file   Highest education level: Associate degree: occupational, scientist, product/process development, or vocational program  Occupational History   Not on file  Tobacco Use   Smoking status: Former    Current packs/day: 0.00    Types: Cigarettes    Start date: 08/11/2010    Quit date: 08/11/2012    Years since quitting: 12.3   Smokeless tobacco: Never   Tobacco comments:    Former smoker 09/26/22  Vaping Use   Vaping status: Never Used  Substance and Sexual Activity   Alcohol use: Yes    Alcohol/week: 2.0 standard drinks of alcohol    Types: 2 Cans of beer per week    Comment: on occassion   Drug use: No   Sexual activity: Not on file    Comment: married ([name])  Other Topics Concern   Not on file  Social History Narrative   Not on file   Social Drivers of Health   Tobacco Use: Medium Risk (12/05/2024)   Patient History    Smoking Tobacco Use: Former    Smokeless Tobacco Use: Never    Passive Exposure: Not on file  Financial Resource Strain: Low Risk (11/07/2024)   Overall Financial Resource Strain (CARDIA)    Difficulty of Paying Living Expenses: Not very hard  Food Insecurity: No Food Insecurity (11/07/2024)   Epic    Worried About Brewing Technologist in the Last Year: Never true    Ran Out of Food in the Last Year: Never true  Transportation Needs: No Transportation Needs (11/07/2024)   Epic    Lack of Transportation (Medical): No    Lack of Transportation (Non-Medical): No  Physical Activity: Insufficiently Active (11/07/2024)   Exercise Vital Sign    Days of Exercise per Week: 3 days    Minutes of Exercise per Session: 30 min  Stress: Stress Concern Present (11/07/2024)   [employer] of Occupational Health - Occupational Stress Questionnaire    Feeling of Stress: To some extent  Social Connections: Moderately Integrated (11/07/2024)   Social Connection and Isolation Panel    Frequency of Communication with Friends and Family: More than three times a week    Frequency of Social Gatherings with Friends and Family:  Twice a week    Attends Religious Services: 1 to 4 times per year    Active Member of Clubs or Organizations: No    Attends Engineer, Structural: Not on file    Marital Status: Married  Recent Concern: Social Connections - Moderately Isolated (09/02/2024)   Social Connection and Isolation Panel    Frequency of Communication with Friends and Family: More than three times a week    Frequency of Social Gatherings with Friends and Family: Three times a week    Attends Religious Services: Never    Active Member of Clubs or Organizations: No    Attends Banker Meetings: Not on file    Marital Status: Married  Catering Manager Violence: Not At Risk (03/01/2024)   Humiliation, Afraid, Rape, and Kick questionnaire    Fear of Current or Ex-Partner: No    Emotionally Abused: No    Physically Abused: No    Sexually Abused: No  Depression (PHQ2-9): Low Risk (11/11/2024)   Depression (PHQ2-9)    PHQ-2 Score: 0  Alcohol Screen: Low Risk (11/07/2024)   Alcohol Screen    Last Alcohol Screening Score (AUDIT): 4  Housing: Low Risk (11/07/2024)   Epic    Unable to Pay for Housing in the Last Year: No     Number of Times Moved in the Last Year: 0    Homeless in the Last Year: No  Utilities: Not At Risk (03/01/2024)   AHC Utilities    Threatened with loss of utilities: No  Health Literacy: Adequate Health Literacy (06/12/2024)   B1300 Health Literacy    Frequency of need for help with medical instructions: Never    Medications:   Current Outpatient Medications on File Prior to Visit  Medication Sig Dispense Refill   acetaminophen  (TYLENOL ) 650 MG CR tablet Take 1,300 mg by mouth 2 (two) times daily.     apixaban  (ELIQUIS ) 5 MG TABS tablet Take 1 tablet by mouth twice daily 60 tablet 5   atenolol  (TENORMIN ) 50 MG tablet Take 1 tablet by mouth twice daily 180 tablet 3   docusate sodium  (COLACE) 100 MG capsule Take 100 mg by mouth in the morning.     HOMEOPATHIC PRODUCTS EX Apply 1 Application topically 3 (three) times daily as needed (pain.). Hempvana Pain Relief Cream     methocarbamol  (ROBAXIN ) 500 MG tablet Take 500 mg by mouth every 8 (eight) hours as needed for muscle spasms.      Multiple Vitamins-Minerals (MULTIVITAMIN WITH MINERALS) tablet Take 1 tablet by mouth in the morning.     naloxone  (NARCAN ) nasal spray 4 mg/0.1 mL Place 1 spray into the nose as needed (opioid reversal).     oxyCODONE -acetaminophen  (PERCOCET) 7.5-325 MG tablet Take 1 tablet by mouth in the morning, at noon, and at bedtime.     sildenafil  (REVATIO ) 20 MG tablet Take 1-5 tablets (20-100 mg total) by mouth daily as needed (ED). 90 tablet 2   traMADol  (ULTRAM ) 50 MG tablet Take 100 mg by mouth 3 (three) times daily.     No current facility-administered medications on file prior to visit.    Allergies:   Allergies  Allergen Reactions   Mushroom Swelling and Other (See Comments)    Causes tongue swelling that cuts of airway   Erythromycin Other (See Comments)    Abdominal cramps    Hydrocodone -Acetaminophen  Other (See Comments)    Makes him feel high   Mobic [Meloxicam] Other (See Comments)    Lower  back and kidney pain    Prednisone Hives, Anxiety and Other (See Comments)    Makes him anxious   Z-Pak [Azithromycin] Nausea Only and Other (See Comments)    Severe stomach cramps    Cefuroxime     worsening back pain?   Celecoxib     myalgias   Duloxetine Hcl     spacy   Lopressor  [Metoprolol  Tartrate] Swelling   Metaxalone     Unknown   Amoxicillin  Palpitations   Penicillins Rash and Other (See Comments)    Has patient had a PCN reaction causing immediate rash, facial/tongue/throat swelling, SOB or lightheadedness with hypotension: No Has patient had a PCN reaction causing severe rash involving mucus membranes or skin necrosis: No Has patient had a PCN reaction that required hospitalization: No Has patient had a PCN reaction occurring within the last 10 years: No If all of the above answers are NO, then may proceed with Cephalosporin use.      Physical Exam Today's Vitals   12/05/24 0841  BP: 136/69  Pulse: 87  Weight: 233 lb (105.7 kg)  Height: 5' 8 (1.727 m)   Body mass index is 35.43 kg/m.   General: obese middle aged caucasian male, seated, in no evident distress Head: head normocephalic and atraumatic.  Neck: supple with no carotid or supraclavicular bruits Cardiovascular: irregular rate and rhythm, no murmurs  Neurologic Exam Mental Status: Awake and fully alert.  Fluent speech and language. Oriented to place and time. Recent and remote memory intact. Attention span, concentration and fund of knowledge appropriate. Mood and affect appropriate.  Cranial Nerves: Pupils equal, briskly reactive to light. Extraocular movements full without nystagmus. Visual fields full to confrontation. Hearing intact. Facial sensation intact. Face, tongue, palate moves normally and symmetrically.  Motor: Normal bulk and tone. Normal strength in all tested extremity muscles. Slightly decreased right hand dexterity.  Sensory.: intact to touch ,pinprick .position and vibratory  sensation.  Coordination: Rapid alternating movements normal in all extremities. Finger-to-nose and heel-to-shin performed accurately bilaterally. Gait and Station: Arises from chair without difficulty. Stance is normal. Gait demonstrates normal stride length and balance without use of AD Reflexes: 1+ and symmetric. Toes downgoing.         ASSESSMENT: 63 year old patient with embolic right cerebellar infarct in March 2025 from atrial fibrillation was not on anticoagulation with mild residual deficit of decreased right hand dexterity. Vascular risk factors of atrial fibrillation and mild obesity and at risk for sleep apnea.     PLAN:  - Continue Eliquis  5 mg twice daily for secondary stroke prevention managed/prescribed by PCP/cardiology - Continue close PCP and cardiology follow-up for aggressive stroke risk factor management - not currently on statin due to intolerance. Currently working on diet modifications. Follow up with PCP/cardiology for ongoing management. May consider zetia or PCSK-9 if needed - unclear cause of heightened hearing and sound sensitivity. If this becomes more bothersome, may benefit from ENT/auditory evaluation    No further recommendations from stroke standpoint and is routinely followed by PCP and cardiology.  He can follow-up here on an as-needed basis but advised to call with any questions or concerns in the future.    I personally spent a total of 25 minutes in the care of the patient today including preparing to see the patient, getting/reviewing separately obtained history, performing a medically appropriate exam/evaluation, counseling and educating, and documenting clinical information in the EHR.  Harlene Bogaert, AGNP-BC  [facility] [address] [place], [place]  [address]  Phone [phone] Fax [phone] Note: This document was prepared with digital dictation and possible smart phrase technology. Any  transcriptional errors that result from this process are unintentional.

## 2024-12-05 ENCOUNTER — Ambulatory Visit: Admitting: Adult Health

## 2024-12-05 ENCOUNTER — Encounter: Payer: Self-pay | Admitting: Adult Health

## 2024-12-05 ENCOUNTER — Encounter

## 2024-12-05 VITALS — BP 136/69 | HR 87 | Ht 68.0 in | Wt 233.0 lb

## 2024-12-05 DIAGNOSIS — I639 Cerebral infarction, unspecified: Secondary | ICD-10-CM

## 2024-12-05 DIAGNOSIS — I482 Chronic atrial fibrillation, unspecified: Secondary | ICD-10-CM

## 2024-12-05 NOTE — Patient Instructions (Signed)
 Continue Eliquis  for secondary stroke prevention  Continue to follow with cardiology routinely as scheduled  Continue to follow up with PCP regarding cholesterol and blood pressure management  Maintain strict control of hypertension with blood pressure goal below 130/90 and cholesterol with LDL cholesterol (bad cholesterol) goal below 70 mg/dL.   Signs of a Stroke? Follow the BEFAST method:  Balance Watch for a sudden loss of balance, trouble with coordination or vertigo Eyes Is there a sudden loss of vision in one or both eyes? Or double vision?  Face: Ask the person to smile. Does one side of the face droop or is it numb?  Arms: Ask the person to raise both arms. Does one arm drift downward? Is there weakness or numbness of a leg? Speech: Ask the person to repeat a simple phrase. Does the speech sound slurred/strange? Is the person confused ? Time: If you observe any of these signs, call 911.       Thank you for coming to see us  at [facility]. I hope we have been able to provide you high quality care today.  You may receive a patient satisfaction survey over the next few weeks. We would appreciate your feedback and comments so that we may continue to improve ourselves and the health of our patients.     Cholesterol Content in Foods Cholesterol is a waxy, fat-like substance that helps to carry fat in the blood. The body needs cholesterol in small amounts, but too much cholesterol can cause damage to the arteries and heart. What foods have cholesterol?  Cholesterol is found in animal-based foods, such as meat, seafood, and dairy. Generally, low-fat dairy and lean meats have less cholesterol than full-fat dairy and fatty meats. The milligrams of cholesterol per serving (mg per serving) of common cholesterol-containing foods are listed below. Meats and other proteins Egg -- one large whole egg has 186 mg. Veal shank -- 4 oz (113 g) has 141 mg. Lean ground turkey  (93% lean) -- 4 oz (113 g) has 118 mg. Fat-trimmed lamb loin -- 4 oz (113 g) has 106 mg. Lean ground beef (90% lean) -- 4 oz (113 g) has 100 mg. Lobster -- 3.5 oz (99 g) has 90 mg. Pork loin chops -- 4 oz (113 g) has 86 mg. Canned salmon -- 3.5 oz (99 g) has 83 mg. Fat-trimmed beef top loin -- 4 oz (113 g) has 78 mg. Frankfurter -- 1 frank (3.5 oz or 99 g) has 77 mg. Crab -- 3.5 oz (99 g) has 71 mg. Roasted chicken without skin, white meat -- 4 oz (113 g) has 66 mg. Light bologna -- 2 oz (57 g) has 45 mg. Deli-cut turkey -- 2 oz (57 g) has 31 mg. Canned tuna -- 3.5 oz (99 g) has 31 mg. Aldona -- 1 oz (28 g) has 29 mg. Oysters and mussels (raw) -- 3.5 oz (99 g) has 25 mg. Mackerel -- 1 oz (28 g) has 22 mg. Trout -- 1 oz (28 g) has 20 mg. Pork sausage -- 1 link (1 oz or 28 g) has 17 mg. Salmon -- 1 oz (28 g) has 16 mg. Tilapia -- 1 oz (28 g) has 14 mg. Dairy Soft-serve ice cream --  cup (4 oz or 86 g) has 103 mg. Whole-milk yogurt -- 1 cup (8 oz or 245 g) has 29 mg. Cheddar cheese -- 1 oz (28 g) has 28 mg. American cheese -- 1 oz (28 g) has 28 mg. Whole  milk -- 1 cup (8 oz or 250 mL) has 23 mg. 2% milk -- 1 cup (8 oz or 250 mL) has 18 mg. Cream cheese -- 1 tablespoon (Tbsp) (14.5 g) has 15 mg. Cottage cheese --  cup (4 oz or 113 g) has 14 mg. Low-fat (1%) milk -- 1 cup (8 oz or 250 mL) has 10 mg. Sour cream -- 1 Tbsp (12 g) has 8.5 mg. Low-fat yogurt -- 1 cup (8 oz or 245 g) has 8 mg. Nonfat Greek yogurt -- 1 cup (8 oz or 228 g) has 7 mg. Half-and-half cream -- 1 Tbsp (15 mL) has 5 mg. Fats and oils Cod liver oil -- 1 tablespoon (Tbsp) (13.6 g) has 82 mg. Butter -- 1 Tbsp (14 g) has 15 mg. Lard -- 1 Tbsp (12.8 g) has 14 mg. Bacon grease -- 1 Tbsp (12.9 g) has 14 mg. Mayonnaise -- 1 Tbsp (13.8 g) has 5-10 mg. Margarine -- 1 Tbsp (14 g) has 3-10 mg. The items listed above may not be a complete list of foods with cholesterol. Exact amounts of cholesterol in these foods may vary  depending on specific ingredients and brands. Contact a dietitian for more information. What foods do not have cholesterol? Most plant-based foods do not have cholesterol unless you combine them with a food that has cholesterol. Foods without cholesterol include: Grains and cereals. Vegetables. Fruits. Vegetable oils, such as olive, canola, and sunflower oil. Legumes, such as peas, beans, and lentils. Nuts and seeds. Egg whites. The items listed above may not be a complete list of foods that do not have cholesterol. Contact a dietitian for more information. Summary The body needs cholesterol in small amounts, but too much cholesterol can cause damage to the arteries and heart. Cholesterol is found in animal-based foods, such as meat, seafood, and dairy. Generally, low-fat dairy and lean meats have less cholesterol than full-fat dairy and fatty meats. This information is not intended to replace advice given to you by your health care provider. Make sure you discuss any questions you have with your health care provider. Document Revised: 04/23/2021 Document Reviewed: 04/23/2021 Elsevier Patient Education  2024 [employer].

## 2024-12-06 ENCOUNTER — Ambulatory Visit: Attending: Cardiology

## 2024-12-06 DIAGNOSIS — I4819 Other persistent atrial fibrillation: Secondary | ICD-10-CM | POA: Diagnosis not present

## 2024-12-07 LAB — CUP PACEART REMOTE DEVICE CHECK
Date Time Interrogation Session: 20251211232953
Implantable Pulse Generator Implant Date: 20221201

## 2024-12-13 NOTE — Progress Notes (Signed)
 Remote Loop Recorder Transmission

## 2024-12-22 ENCOUNTER — Ambulatory Visit: Payer: Self-pay | Admitting: Cardiology

## 2024-12-27 ENCOUNTER — Encounter

## 2024-12-27 ENCOUNTER — Telehealth: Payer: Self-pay | Admitting: Family Medicine

## 2024-12-27 ENCOUNTER — Other Ambulatory Visit: Payer: Self-pay

## 2024-12-27 MED ORDER — DICLOFENAC SODIUM 50 MG PO TBEC: 50.0000 mg | DELAYED_RELEASE_TABLET | Freq: Two times a day (BID) | ORAL | 1 refills | Status: AC

## 2024-12-27 NOTE — Telephone Encounter (Signed)
 Fax from [facility]  Diclofenac  sodium 50 mg  # 60 Last filled 11/17/24

## 2025-01-04 ENCOUNTER — Encounter

## 2025-01-06 ENCOUNTER — Ambulatory Visit: Attending: Cardiology

## 2025-01-06 DIAGNOSIS — Z8673 Personal history of transient ischemic attack (TIA), and cerebral infarction without residual deficits: Secondary | ICD-10-CM | POA: Diagnosis not present

## 2025-01-06 LAB — CUP PACEART REMOTE DEVICE CHECK
Date Time Interrogation Session: 20260111233702
Implantable Pulse Generator Implant Date: 20221201

## 2025-01-10 NOTE — Progress Notes (Signed)
 Remote Loop Recorder Transmission

## 2025-01-12 ENCOUNTER — Ambulatory Visit: Payer: Self-pay | Admitting: Cardiology

## 2025-01-13 ENCOUNTER — Encounter: Payer: Self-pay | Admitting: Cardiology

## 2025-01-23 ENCOUNTER — Ambulatory Visit: Admitting: Cardiology

## 2025-02-04 ENCOUNTER — Encounter

## 2025-02-06 ENCOUNTER — Ambulatory Visit

## 2025-03-07 ENCOUNTER — Encounter

## 2025-03-09 ENCOUNTER — Ambulatory Visit

## 2025-04-07 ENCOUNTER — Encounter

## 2025-04-09 ENCOUNTER — Ambulatory Visit

## 2025-06-13 ENCOUNTER — Ambulatory Visit: Admitting: Family Medicine
